# Patient Record
Sex: Male | Born: 1935 | Race: White | Hispanic: No | Marital: Married | Smoking: Former smoker
Health system: Southern US, Community
[De-identification: ages and names within clinical notes are randomized; demographics above are authoritative.]

## PROBLEM LIST (undated history)

## (undated) DIAGNOSIS — E039 Hypothyroidism, unspecified: Secondary | ICD-10-CM

## (undated) DIAGNOSIS — I2510 Atherosclerotic heart disease of native coronary artery without angina pectoris: Secondary | ICD-10-CM

## (undated) DIAGNOSIS — I2119 ST elevation (STEMI) myocardial infarction involving other coronary artery of inferior wall: Secondary | ICD-10-CM

## (undated) DIAGNOSIS — I340 Nonrheumatic mitral (valve) insufficiency: Secondary | ICD-10-CM

## (undated) DIAGNOSIS — E782 Mixed hyperlipidemia: Secondary | ICD-10-CM

## (undated) DIAGNOSIS — N400 Enlarged prostate without lower urinary tract symptoms: Secondary | ICD-10-CM

## (undated) DIAGNOSIS — E119 Type 2 diabetes mellitus without complications: Secondary | ICD-10-CM

## (undated) HISTORY — DX: Nonrheumatic mitral (valve) insufficiency: I34.0

## (undated) HISTORY — DX: Hypothyroidism, unspecified: E03.9

## (undated) HISTORY — DX: Benign prostatic hyperplasia without lower urinary tract symptoms: N40.0

## (undated) HISTORY — DX: Atherosclerotic heart disease of native coronary artery without angina pectoris: I25.10

## (undated) HISTORY — DX: Type 2 diabetes mellitus without complications: E11.9

## (undated) HISTORY — DX: Mixed hyperlipidemia: E78.2

## (undated) HISTORY — PX: STOMACH SURGERY: SHX791

---

## 2012-05-07 ENCOUNTER — Encounter: Payer: Self-pay | Admitting: *Deleted

## 2012-05-07 ENCOUNTER — Ambulatory Visit (INDEPENDENT_AMBULATORY_CARE_PROVIDER_SITE_OTHER): Payer: Medicare Other | Admitting: Cardiology

## 2012-05-07 ENCOUNTER — Encounter: Payer: Self-pay | Admitting: Cardiology

## 2012-05-07 ENCOUNTER — Other Ambulatory Visit: Payer: Self-pay | Admitting: Cardiology

## 2012-05-07 VITALS — BP 161/69 | HR 61 | Ht 70.0 in | Wt 185.0 lb

## 2012-05-07 DIAGNOSIS — R072 Precordial pain: Secondary | ICD-10-CM

## 2012-05-07 NOTE — Progress Notes (Signed)
HPI The patient has no prior cardiac history. He is an active gentleman who looks younger than his stated age. However, in October he awoke with some chest discomfort. This was mid sternal 6/10 discomfort. He had not had this to work. He awoke him from his sleep and he got up to take some times. He said it lasted for part of that evening. It eventually went away. He does not recall having any associated symptoms such as nausea vomiting or diaphoresis. He has not had any palpitations, presyncope or syncope. He has had no PND or orthopnea. He has had no weight gain or edema. He is very active working in his yard and tends to a large garden. With this level of activity he cannot bring on any symptoms. He has not had any prior cardiac workup that he recalls.  No Known Allergies  Current Outpatient Prescriptions  Medication Sig Dispense Refill  . doxazosin (CARDURA) 4 MG tablet Take 4 mg by mouth at bedtime.       . fish oil-omega-3 fatty acids 1000 MG capsule Take 1 g by mouth daily.      Marland Kitchen levothyroxine (SYNTHROID, LEVOTHROID) 50 MCG tablet Take 50 mcg by mouth daily.       . metFORMIN (GLUCOPHAGE) 1000 MG tablet Take 1,000 mg by mouth daily with breakfast.         Past Medical History  Diagnosis Date  . Diabetes   . Hypothyroid   . BPH (benign prostatic hyperplasia)     Past Surgical History  Procedure Date  . Stomach surgery     Treatment of an abscess    Family History  Problem Relation Age of Onset  . Heart failure Father 29  . Parkinsonism Mother 2    History   Social History  . Marital Status: Unknown    Spouse Name: N/A    Number of Children: 1  . Years of Education: N/A   Occupational History  . Not on file.   Social History Main Topics  . Smoking status: Former Smoker -- 1.0 packs/day for 40 years    Types: Cigarettes    Quit date: 06/26/1982  . Smokeless tobacco: Not on file  . Alcohol Use: No  . Drug Use: No  . Sexually Active: Not on file   Other  Topics Concern  . Not on file   Social History Narrative   Lives at home with wife.      ROS:  Positive for decreased hearing. Otherwise as stated in the HPI and negative for all other systems.  PHYSICAL EXAM BP 161/69  Pulse 61  Ht 5\' 10"  (1.778 m)  Wt 185 lb (83.915 kg)  BMI 26.54 kg/m2 GENERAL:  Well appearing HEENT:  Pupils equal round and reactive, fundi not visualized, oral mucosa unremarkable NECK:  No jugular venous distention, waveform within normal limits, carotid upstroke brisk and symmetric, no bruits, no thyromegaly LYMPHATICS:  No cervical, inguinal adenopathy LUNGS:  Clear to auscultation bilaterally BACK:  No CVA tenderness CHEST:  Unremarkable HEART:  PMI not displaced or sustained,S1 and S2 within normal limits, no S3, no S4, no clicks, no rubs, no murmurs ABD:  Flat, positive bowel sounds normal in frequency in pitch, no bruits, no rebound, no guarding, no midline pulsatile mass, no hepatomegaly, no splenomegaly EXT:  2 plus pulses throughout, no edema, no cyanosis no clubbing SKIN:  No rashes no nodules NEURO:  Cranial nerves II through XII grossly intact, motor grossly intact throughout PSYCH:  Cognitively intact, oriented to person place and time  EKG:   Sinus bradycardia, rate 50, left axis deviation, left anterior fascicular block, early transition in lead V2, no acute ST-T wave changes. 05/07/2012  ASSESSMENT AND PLAN  Chest pain - The chest pain is somewhat atypical. However, he does have cardiovascular risk factors. I think screening with an exercise treadmill test would be reasonable. If this is normal no further cardiovascular testing is suggested.  HTN - His blood pressure is well controlled. He will continue the medicines as listed.

## 2012-05-07 NOTE — Patient Instructions (Addendum)
Your physician recommends that you schedule a follow-up appointment as needed. We will call you with your results. Your physician recommends that you continue on your current medications as directed. Please refer to the Current Medication list given to you today.  Your physician has requested that you have an exercise tolerance test. For further information please visit [email or web address]. Please also follow instruction sheet, as given.

## 2012-05-09 DIAGNOSIS — R079 Chest pain, unspecified: Secondary | ICD-10-CM

## 2012-05-17 ENCOUNTER — Telehealth: Payer: Self-pay | Admitting: *Deleted

## 2012-05-17 NOTE — Telephone Encounter (Signed)
Message copied by Merlene Laughter on Fri May 17, 2012  1:08 PM ------      Message from: Minus Breeding      Created: Thu May 16, 2012  7:36 PM       He had a very significant BP response with his stress test.  Call the patient and let him know that I would like for him to follow with Dr. Hampton Abbot for BP management.

## 2012-05-17 NOTE — Telephone Encounter (Signed)
Patient informed and copy sent to PCP. 

## 2017-11-03 ENCOUNTER — Inpatient Hospital Stay (HOSPITAL_COMMUNITY)
Admission: EM | Admit: 2017-11-03 | Discharge: 2017-11-08 | DRG: 246 | Disposition: A | Discharge status: Home Health | Payer: Medicare Other | Attending: Cardiology | Admitting: Cardiology

## 2017-11-03 ENCOUNTER — Encounter (HOSPITAL_COMMUNITY): Payer: Self-pay | Admitting: Student

## 2017-11-03 ENCOUNTER — Ambulatory Visit (HOSPITAL_COMMUNITY)
Admission: EM | Admit: 2017-11-03 | Discharge: 2017-11-03 | Disposition: A | Discharge status: Home / Self Care | Payer: Medicare Other | Source: Ambulatory Visit | Attending: Cardiovascular Disease | Admitting: Cardiovascular Disease

## 2017-11-03 ENCOUNTER — Encounter (HOSPITAL_COMMUNITY)
Admission: EM | Disposition: A | Discharge status: Home Health | Payer: Self-pay | Source: Home / Self Care | Attending: Cardiovascular Disease

## 2017-11-03 ENCOUNTER — Other Ambulatory Visit: Payer: Self-pay

## 2017-11-03 DIAGNOSIS — I25110 Atherosclerotic heart disease of native coronary artery with unstable angina pectoris: Secondary | ICD-10-CM | POA: Diagnosis present

## 2017-11-03 DIAGNOSIS — N179 Acute kidney failure, unspecified: Secondary | ICD-10-CM | POA: Diagnosis present

## 2017-11-03 DIAGNOSIS — Z955 Presence of coronary angioplasty implant and graft: Secondary | ICD-10-CM

## 2017-11-03 DIAGNOSIS — Z8249 Family history of ischemic heart disease and other diseases of the circulatory system: Secondary | ICD-10-CM

## 2017-11-03 DIAGNOSIS — R001 Bradycardia, unspecified: Secondary | ICD-10-CM | POA: Diagnosis present

## 2017-11-03 DIAGNOSIS — I472 Ventricular tachycardia: Secondary | ICD-10-CM | POA: Diagnosis not present

## 2017-11-03 DIAGNOSIS — R011 Cardiac murmur, unspecified: Secondary | ICD-10-CM | POA: Diagnosis present

## 2017-11-03 DIAGNOSIS — E1122 Type 2 diabetes mellitus with diabetic chronic kidney disease: Secondary | ICD-10-CM | POA: Diagnosis present

## 2017-11-03 DIAGNOSIS — N183 Chronic kidney disease, stage 3 unspecified: Secondary | ICD-10-CM | POA: Diagnosis present

## 2017-11-03 DIAGNOSIS — I129 Hypertensive chronic kidney disease with stage 1 through stage 4 chronic kidney disease, or unspecified chronic kidney disease: Secondary | ICD-10-CM | POA: Diagnosis present

## 2017-11-03 DIAGNOSIS — I2111 ST elevation (STEMI) myocardial infarction involving right coronary artery: Secondary | ICD-10-CM | POA: Diagnosis not present

## 2017-11-03 DIAGNOSIS — R079 Chest pain, unspecified: Secondary | ICD-10-CM | POA: Diagnosis present

## 2017-11-03 DIAGNOSIS — I340 Nonrheumatic mitral (valve) insufficiency: Secondary | ICD-10-CM

## 2017-11-03 DIAGNOSIS — Z7984 Long term (current) use of oral hypoglycemic drugs: Secondary | ICD-10-CM | POA: Diagnosis not present

## 2017-11-03 DIAGNOSIS — E118 Type 2 diabetes mellitus with unspecified complications: Secondary | ICD-10-CM | POA: Diagnosis not present

## 2017-11-03 DIAGNOSIS — I361 Nonrheumatic tricuspid (valve) insufficiency: Secondary | ICD-10-CM | POA: Diagnosis not present

## 2017-11-03 DIAGNOSIS — I4901 Ventricular fibrillation: Secondary | ICD-10-CM | POA: Diagnosis not present

## 2017-11-03 DIAGNOSIS — I255 Ischemic cardiomyopathy: Secondary | ICD-10-CM | POA: Diagnosis present

## 2017-11-03 DIAGNOSIS — E785 Hyperlipidemia, unspecified: Secondary | ICD-10-CM | POA: Diagnosis present

## 2017-11-03 DIAGNOSIS — I2510 Atherosclerotic heart disease of native coronary artery without angina pectoris: Secondary | ICD-10-CM | POA: Diagnosis not present

## 2017-11-03 DIAGNOSIS — N400 Enlarged prostate without lower urinary tract symptoms: Secondary | ICD-10-CM | POA: Diagnosis present

## 2017-11-03 DIAGNOSIS — I080 Rheumatic disorders of both mitral and aortic valves: Secondary | ICD-10-CM | POA: Diagnosis present

## 2017-11-03 DIAGNOSIS — I2119 ST elevation (STEMI) myocardial infarction involving other coronary artery of inferior wall: Principal | ICD-10-CM | POA: Diagnosis present

## 2017-11-03 DIAGNOSIS — Z79899 Other long term (current) drug therapy: Secondary | ICD-10-CM

## 2017-11-03 DIAGNOSIS — Z87891 Personal history of nicotine dependence: Secondary | ICD-10-CM

## 2017-11-03 DIAGNOSIS — Z79890 Hormone replacement therapy (postmenopausal): Secondary | ICD-10-CM

## 2017-11-03 DIAGNOSIS — N170 Acute kidney failure with tubular necrosis: Secondary | ICD-10-CM | POA: Diagnosis not present

## 2017-11-03 DIAGNOSIS — I313 Pericardial effusion (noninflammatory): Secondary | ICD-10-CM | POA: Diagnosis present

## 2017-11-03 DIAGNOSIS — L8990 Pressure ulcer of unspecified site, unspecified stage: Secondary | ICD-10-CM | POA: Clinically undetermined

## 2017-11-03 DIAGNOSIS — E039 Hypothyroidism, unspecified: Secondary | ICD-10-CM | POA: Diagnosis present

## 2017-11-03 DIAGNOSIS — E782 Mixed hyperlipidemia: Secondary | ICD-10-CM | POA: Diagnosis not present

## 2017-11-03 DIAGNOSIS — I213 ST elevation (STEMI) myocardial infarction of unspecified site: Secondary | ICD-10-CM | POA: Diagnosis present

## 2017-11-03 HISTORY — DX: ST elevation (STEMI) myocardial infarction involving other coronary artery of inferior wall: I21.19

## 2017-11-03 HISTORY — PX: CORONARY/GRAFT ACUTE MI REVASCULARIZATION: CATH118305

## 2017-11-03 HISTORY — PX: LEFT HEART CATH AND CORONARY ANGIOGRAPHY: CATH118249

## 2017-11-03 HISTORY — PX: CORONARY STENT INTERVENTION: CATH118234

## 2017-11-03 LAB — TROPONIN I

## 2017-11-03 LAB — MRSA PCR SCREENING: MRSA BY PCR: NEGATIVE

## 2017-11-03 LAB — HEMOGLOBIN A1C
Hgb A1c MFr Bld: 6.2 % — ABNORMAL HIGH (ref 4.8–5.6)
MEAN PLASMA GLUCOSE: 131.24 mg/dL

## 2017-11-03 SURGERY — CORONARY/GRAFT ACUTE MI REVASCULARIZATION
Anesthesia: LOCAL

## 2017-11-03 MED ORDER — ACETAMINOPHEN 325 MG PO TABS: 650.0000 mg | ORAL_TABLET | ORAL | Status: DC | PRN

## 2017-11-03 MED ORDER — SODIUM CHLORIDE 0.9% FLUSH: 3.0000 mL | INTRAVENOUS | Status: DC | PRN

## 2017-11-03 MED ORDER — METOPROLOL TARTRATE 5 MG/5ML IV SOLN
INTRAVENOUS | Status: AC
  Filled 2017-11-03: qty 5

## 2017-11-03 MED ORDER — HEPARIN (PORCINE) IN NACL 1000-0.9 UT/500ML-% IV SOLN
INTRAVENOUS | Status: AC
  Filled 2017-11-03: qty 1000

## 2017-11-03 MED ORDER — METOPROLOL TARTRATE 5 MG/5ML IV SOLN
INTRAVENOUS | Status: DC | PRN
  Administered 2017-11-03: 2.5 mg via INTRAVENOUS

## 2017-11-03 MED ORDER — ASPIRIN EC 81 MG PO TBEC
81.0000 mg | DELAYED_RELEASE_TABLET | Freq: Every day | ORAL | Status: DC
  Administered 2017-11-04 – 2017-11-08 (×5): 81 mg via ORAL
  Filled 2017-11-03 (×5): qty 1

## 2017-11-03 MED ORDER — SODIUM CHLORIDE 0.9 % IV SOLN
INTRAVENOUS | Status: DC
  Administered 2017-11-03: 18:00:00 via INTRAVENOUS
  Administered 2017-11-03: 75 mL/h via INTRAVENOUS

## 2017-11-03 MED ORDER — TICAGRELOR 90 MG PO TABS
ORAL_TABLET | ORAL | Status: DC | PRN
  Administered 2017-11-03: 180 mg via ORAL

## 2017-11-03 MED ORDER — SODIUM CHLORIDE 0.9 % IV SOLN
INTRAVENOUS | Status: AC | PRN
  Administered 2017-11-03 (×2): 1.75 mg/kg/h via INTRAVENOUS

## 2017-11-03 MED ORDER — BIVALIRUDIN TRIFLUOROACETATE 250 MG IV SOLR
INTRAVENOUS | Status: AC
  Filled 2017-11-03: qty 250

## 2017-11-03 MED ORDER — AMIODARONE HCL IN DEXTROSE 360-4.14 MG/200ML-% IV SOLN
INTRAVENOUS | Status: AC
  Filled 2017-11-03: qty 200

## 2017-11-03 MED ORDER — HEPARIN SODIUM (PORCINE) 1000 UNIT/ML IJ SOLN
INTRAMUSCULAR | Status: DC | PRN
  Administered 2017-11-03: 2000 [IU] via INTRAVENOUS

## 2017-11-03 MED ORDER — IOHEXOL 350 MG/ML SOLN
INTRAVENOUS | Status: DC | PRN
  Administered 2017-11-03: 145 mL via INTRAVENOUS

## 2017-11-03 MED ORDER — SODIUM CHLORIDE 0.9% FLUSH
3.0000 mL | Freq: Two times a day (BID) | INTRAVENOUS | Status: DC
  Administered 2017-11-03 – 2017-11-07 (×9): 3 mL via INTRAVENOUS

## 2017-11-03 MED ORDER — MORPHINE SULFATE (PF) 2 MG/ML IV SOLN: 2.0000 mg | INTRAVENOUS | Status: DC | PRN

## 2017-11-03 MED ORDER — AMIODARONE HCL IN DEXTROSE 360-4.14 MG/200ML-% IV SOLN
INTRAVENOUS | Status: AC | PRN
  Administered 2017-11-03: 30 mg/h via INTRAVENOUS

## 2017-11-03 MED ORDER — VERAPAMIL HCL 2.5 MG/ML IV SOLN
INTRAVENOUS | Status: AC
  Filled 2017-11-03: qty 2

## 2017-11-03 MED ORDER — SODIUM CHLORIDE 0.9 % IV SOLN
INTRAVENOUS | Status: AC | PRN
  Administered 2017-11-03: 10 mL/h via INTRAVENOUS

## 2017-11-03 MED ORDER — VERAPAMIL HCL 2.5 MG/ML IV SOLN
INTRA_ARTERIAL | Status: DC | PRN
  Administered 2017-11-03: 8 mL via INTRA_ARTERIAL

## 2017-11-03 MED ORDER — AMIODARONE HCL 150 MG/3ML IV SOLN
INTRAVENOUS | Status: AC
  Filled 2017-11-03: qty 3

## 2017-11-03 MED ORDER — AMIODARONE LOAD VIA INFUSION
150.0000 mg | Freq: Once | INTRAVENOUS | Status: AC
  Administered 2017-11-03: 150 mg via INTRAVENOUS
  Filled 2017-11-03: qty 83.34

## 2017-11-03 MED ORDER — AMIODARONE HCL IN DEXTROSE 360-4.14 MG/200ML-% IV SOLN
30.0000 mg/h | INTRAVENOUS | Status: DC
  Administered 2017-11-03: 30 mg/h via INTRAVENOUS

## 2017-11-03 MED ORDER — AMIODARONE HCL IN DEXTROSE 360-4.14 MG/200ML-% IV SOLN
60.0000 mg/h | INTRAVENOUS | Status: AC
  Administered 2017-11-03 (×2): 60 mg/h via INTRAVENOUS
  Filled 2017-11-03: qty 200

## 2017-11-03 MED ORDER — ENOXAPARIN SODIUM 40 MG/0.4ML ~~LOC~~ SOLN: 40.0000 mg | SUBCUTANEOUS | Status: DC

## 2017-11-03 MED ORDER — HEPARIN SODIUM (PORCINE) 1000 UNIT/ML IJ SOLN
INTRAMUSCULAR | Status: AC
  Filled 2017-11-03: qty 1

## 2017-11-03 MED ORDER — LIDOCAINE HCL (PF) 1 % IJ SOLN
INTRAMUSCULAR | Status: AC
  Filled 2017-11-03: qty 30

## 2017-11-03 MED ORDER — SODIUM CHLORIDE 0.9 % IV SOLN
INTRAVENOUS | Status: AC | PRN
  Administered 2017-11-03: 100 mL/h via INTRAVENOUS

## 2017-11-03 MED ORDER — HEPARIN (PORCINE) IN NACL 2-0.9 UNITS/ML
INTRAMUSCULAR | Status: AC | PRN
  Administered 2017-11-03 (×2): 500 mL

## 2017-11-03 MED ORDER — LABETALOL HCL 5 MG/ML IV SOLN
10.0000 mg | INTRAVENOUS | Status: AC | PRN
  Filled 2017-11-03: qty 4

## 2017-11-03 MED ORDER — HYDRALAZINE HCL 20 MG/ML IJ SOLN
5.0000 mg | INTRAMUSCULAR | Status: AC | PRN
  Administered 2017-11-03 (×2): 5 mg via INTRAVENOUS
  Filled 2017-11-03: qty 1

## 2017-11-03 MED ORDER — SODIUM CHLORIDE 0.9 % IV SOLN: 250.0000 mL | INTRAVENOUS | Status: DC | PRN

## 2017-11-03 MED ORDER — LIDOCAINE HCL (PF) 1 % IJ SOLN
INTRAMUSCULAR | Status: DC | PRN
  Administered 2017-11-03: 2 mL

## 2017-11-03 MED ORDER — NITROGLYCERIN 0.4 MG SL SUBL: 0.4000 mg | SUBLINGUAL_TABLET | SUBLINGUAL | Status: DC | PRN

## 2017-11-03 MED ORDER — BIVALIRUDIN BOLUS VIA INFUSION - CUPID
INTRAVENOUS | Status: DC | PRN
  Administered 2017-11-03: 63 mg via INTRAVENOUS

## 2017-11-03 MED ORDER — TICAGRELOR 90 MG PO TABS
ORAL_TABLET | ORAL | Status: AC
  Filled 2017-11-03: qty 2

## 2017-11-03 MED ORDER — LEVOTHYROXINE SODIUM 50 MCG PO TABS
50.0000 ug | ORAL_TABLET | Freq: Every day | ORAL | Status: DC
  Administered 2017-11-04 – 2017-11-08 (×5): 50 ug via ORAL
  Filled 2017-11-03 (×5): qty 1

## 2017-11-03 MED ORDER — ASPIRIN 81 MG PO CHEW: 81.0000 mg | CHEWABLE_TABLET | Freq: Every day | ORAL | Status: DC

## 2017-11-03 MED ORDER — SODIUM CHLORIDE 0.9 % IV SOLN
1.7500 mg/kg/h | INTRAVENOUS | Status: AC
  Administered 2017-11-03: 1.75 mg/kg/h via INTRAVENOUS
  Filled 2017-11-03: qty 250

## 2017-11-03 MED ORDER — ONDANSETRON HCL 4 MG/2ML IJ SOLN
4.0000 mg | Freq: Four times a day (QID) | INTRAMUSCULAR | Status: DC | PRN
  Administered 2017-11-03: 4 mg via INTRAVENOUS
  Filled 2017-11-03: qty 2

## 2017-11-03 MED ORDER — ONDANSETRON HCL 4 MG/2ML IJ SOLN: 4.0000 mg | Freq: Four times a day (QID) | INTRAMUSCULAR | Status: DC | PRN

## 2017-11-03 MED ORDER — TICAGRELOR 90 MG PO TABS
90.0000 mg | ORAL_TABLET | Freq: Two times a day (BID) | ORAL | Status: DC
  Administered 2017-11-04 – 2017-11-08 (×9): 90 mg via ORAL
  Filled 2017-11-03 (×9): qty 1

## 2017-11-03 MED ORDER — BIVALIRUDIN TRIFLUOROACETATE 250 MG IV SOLR
1.7500 mg/kg/h | INTRAVENOUS | Status: DC
  Administered 2017-11-03: 1.75 mg/kg/h via INTRAVENOUS

## 2017-11-03 MED ORDER — AMIODARONE LOAD VIA INFUSION
INTRAVENOUS | Status: DC | PRN
  Administered 2017-11-03 (×2): 150 mg via INTRAVENOUS

## 2017-11-03 MED ORDER — NITROGLYCERIN 1 MG/10 ML FOR IR/CATH LAB
INTRA_ARTERIAL | Status: AC
  Filled 2017-11-03: qty 10

## 2017-11-03 SURGICAL SUPPLY — 26 items
BALLN SAPPHIRE 2.0X12 (BALLOONS) ×2
BALLN SAPPHIRE 2.5X12 (BALLOONS) ×2
BALLN SAPPHIRE ~~LOC~~ 2.75X15 (BALLOONS) ×2 IMPLANT
BALLN SAPPHIRE ~~LOC~~ 3.25X10 (BALLOONS) ×2 IMPLANT
BALLOON SAPPHIRE 2.0X12 (BALLOONS) ×1 IMPLANT
BALLOON SAPPHIRE 2.5X12 (BALLOONS) ×1 IMPLANT
CATH INFINITI 5FR MULTPACK ANG (CATHETERS) ×2 IMPLANT
CATH OPTITORQUE TIG 4.0 5F (CATHETERS) ×2 IMPLANT
CATH VISTA GUIDE 6FR JR4 (CATHETERS) ×2 IMPLANT
DEVICE RAD COMP TR BAND LRG (VASCULAR PRODUCTS) ×2 IMPLANT
GLIDESHEATH SLEND A-KIT 6F 22G (SHEATH) ×2 IMPLANT
GLIDESHEATH SLEND SS 6F .021 (SHEATH) ×2 IMPLANT
GUIDEWIRE INQWIRE 1.5J.035X260 (WIRE) ×1 IMPLANT
INQWIRE 1.5J .035X260CM (WIRE) ×2
KIT ENCORE 26 ADVANTAGE (KITS) ×4 IMPLANT
KIT HEART LEFT (KITS) ×2 IMPLANT
PACK CARDIAC CATHETERIZATION (CUSTOM PROCEDURE TRAY) ×2 IMPLANT
SHEATH PINNACLE 6F 10CM (SHEATH) ×2 IMPLANT
STENT SYNERGY DES 2.5X12 (Permanent Stent) ×2 IMPLANT
STENT SYNERGY DES 3X16 (Permanent Stent) ×2 IMPLANT
STENT SYNERGY DES 3X32 (Permanent Stent) ×2 IMPLANT
TRANSDUCER W/STOPCOCK (MISCELLANEOUS) ×2 IMPLANT
TUBING CIL FLEX 10 FLL-RA (TUBING) ×2 IMPLANT
WIRE ASAHI PROWATER 180CM (WIRE) ×2 IMPLANT
WIRE EMERALD 3MM-J .035X150CM (WIRE) ×2 IMPLANT
WIRE HI TORQ VERSACORE-J 145CM (WIRE) ×2 IMPLANT

## 2017-11-03 NOTE — Progress Notes (Signed)
CRITICAL VALUE ALERT  Critical Value:  Troponin > 65  Date & Time Notied:  11-03-17 2225  Provider Notified: Dr Harrell Gave  Orders Received/Actions taken: No orders at this time

## 2017-11-03 NOTE — H&P (Signed)
History & Physical    Patient ID: Deonta Bomberger MRN: [id number], DOB/AGE: [birth year]   Admit date: 11/03/2017  Primary Care Provider: Ranae Palms, MD Primary Cardiologist: Previously Followed by Dr. Percival Spanish in 2013  Chief Complaint: Chest Pain/ STEMI  Patient Profile    Randy Fitzpatrick is a 84 y.o. male with past medical history of Hyperlipidemia, Hypothyroidism, and BPH who presents to [facility] on 11/03/2017 as a transfer from [facility] as a CODE STEMI.  History of Present Illness    Randy Fitzpatrick was last evaluated by cardiology in 04/2012 and reported being very active at that time and tending to a garden. He reported a previous episode of chest discomfort and it was recommended he have a treadmill stress test for further evaluation but I cannot see where this was performed.  In talking with the patient today, he reports developing acute chest discomfort around 1345 this afternoon while sitting on his couch. Reports associated dyspnea but denies any nausea, vomiting, or diaphoresis. He presented to [facility] and initial EKG showed ST elevation along the inferior leads, therefore CODE STEMI was activated.   He reports having 2 out of 10 chest discomfort at the time of this encounter. Reports that he is still active and has not experienced any chest discomfort or dyspnea on exertion with activity over the past several weeks. No known history of CAD or stent placement. Does have known hyperlipidemia and hypothyroidism.   Past Medical History:  Diagnosis Date  . BPH (benign prostatic hyperplasia)   . Diabetes ([place])   . Hypothyroid        Medications Prior to Admission: Prior to Admission medications   Medication Sig Start Date End Date Taking? Authorizing Provider  doxazosin (CARDURA) 4 MG tablet Take 4 mg by mouth at bedtime.  05/02/12   [provider]  fish oil-omega-3 fatty acids 1000 MG capsule Take 1 g by mouth daily.    [provider]    levothyroxine (SYNTHROID, LEVOTHROID) 50 MCG tablet Take 50 mcg by mouth daily.  02/29/12   [provider]  metFORMIN (GLUCOPHAGE) 1000 MG tablet Take 1,000 mg by mouth daily with breakfast.  05/02/12   [provider]     Allergies:   No Known Allergies  Social History:   Social History   Socioeconomic History  . Marital status: Unknown    Spouse name: Not on file  . Number of children: 1  . Years of education: Not on file  . Highest education level: Not on file  Occupational History  . Not on file  Social Needs  . Financial resource strain: Not on file  . Food insecurity:    Worry: Not on file    Inability: Not on file  . Transportation needs:    Medical: Not on file    Non-medical: Not on file  Tobacco Use  . Smoking status: Former Smoker    Packs/day: 1.00    Years: 40.00    Pack years: 40.00    Types: Cigarettes    Last attempt to quit: 06/26/1982    Years since quitting: 35.3  Substance and Sexual Activity  . Alcohol use: No  . Drug use: No  . Sexual activity: Not on file  Lifestyle  . Physical activity:    Days per week: Not on file    Minutes per session: Not on file  . Stress: Not on file  Relationships  . Social connections:    Talks on  phone: Not on file    Gets together: Not on file    Attends religious service: Not on file    Active member of club or organization: Not on file    Attends meetings of clubs or organizations: Not on file    Relationship status: Not on file  . Intimate partner violence:    Fear of current or ex partner: Not on file    Emotionally abused: Not on file    Physically abused: Not on file    Forced sexual activity: Not on file  Other Topics Concern  . Not on file  Social History Narrative   Lives at home with wife.        Family History:  The patient's family history includes Heart failure (age of onset: 23) in his father; Parkinsonism (age of onset: 63) in his mother.     Review of Systems     General:  No chills, fever, night sweats or weight changes.  Cardiovascular:  No edema, orthopnea, palpitations, paroxysmal nocturnal dyspnea. Positive for chest pain and dyspnea.  Dermatological: No rash, lesions/masses Respiratory: No cough, dyspnea Urologic: No hematuria, dysuria Abdominal:   No nausea, vomiting, diarrhea, bright red blood per rectum, melena, or hematemesis Neurologic:  No visual changes, wkns, changes in mental status. All other systems reviewed and are otherwise negative except as noted above.  Physical Exam    There were no vitals filed for this visit. No intake or output data in the 24 hours ending 11/03/17 1528 There were no vitals filed for this visit. There is no height or weight on file to calculate BMI.   General: Well developed, elderly Caucasian male in no acute distress. Appears younger than stated age. Head: Normocephalic, atraumatic, sclera non-icteric, no xanthomas, nares are without discharge. Neck: No carotid bruits. JVD not elevated.  Lungs: Respirations regular and unlabored, without wheezes or rales.  Heart: Regular rate and rhythm. No S3 or S4.  No murmur, no rubs, or gallops appreciated. Abdomen: Soft, non-tender, non-distended with normoactive bowel sounds. No hepatomegaly. No rebound/guarding. No obvious abdominal masses. Msk:  Strength and tone appear normal for age. No joint deformities or effusions. Extremities: No clubbing or cyanosis. No edema.  Distal pedal pulses are 2+ bilaterally. Neuro: Alert and oriented X 3. Moves all extremities spontaneously. No focal deficits noted. Psych:  Responds to questions appropriately with a normal affect. Skin: No rashes or lesions noted  Labs and Radiology Studies    EKG:  The ECG that was done was personally reviewed and demonstrates sinus bradycardia, HR 59, with ST elevation along inferior leads.   Relevant CV Studies:  None on File.   Laboratory Data:  ChemistryNo results for input(s):  NA, K, CL, CO2, GLUCOSE, BUN, CREATININE, CALCIUM, GFRNONAA, GFRAA, ANIONGAP in the last 168 hours.  No results for input(s): PROT, ALBUMIN, AST, ALT, ALKPHOS, BILITOT in the last 168 hours. HematologyNo results for input(s): WBC, RBC, HGB, HCT, MCV, MCH, MCHC, RDW, PLT in the last 168 hours. Cardiac EnzymesNo results for input(s): TROPONINI in the last 168 hours. No results for input(s): TROPIPOC in the last 168 hours.  BNPNo results for input(s): BNP, PROBNP in the last 168 hours.  DDimer No results for input(s): DDIMER in the last 168 hours.  Radiology/Studies:  No results found.  Assessment and Plan:   1. Acute Inferior STEMI - patient presents with new-onset chest discomfort which started earlier this afternoon. Pain has improved with administration of SL NTG and is now a  2/10. Initial EKG shows ST elevation along the inferior leads, therefore CODE STEMI was activated and he now presents to the cath lab for an emergent cardiac catheterization. - will check Hgb A1c and FLP. Cycle cardiac enzymes. Initiate high-intensity statin. Would hold BB therapy for now as HR is in the 50's. Further recommendations pending cath results.   2. HLD - no recent FLP on file and not listed as taking a statin.  - will check FLP and plan to start a high-intensity statin following his cath.  3. Hypothyroidism - continue PTA Synthroid.    Severity of Illness: The appropriate patient status for this patient is INPATIENT. Inpatient status is judged to be reasonable and necessary in order to provide the required intensity of service to ensure the patient's safety. The patient's presenting symptoms, physical exam findings, and initial radiographic and laboratory data in the context of their chronic comorbidities is felt to place them at high risk for further clinical deterioration. Furthermore, it is not anticipated that the patient will be medically stable for discharge from the hospital within 2 midnights of  admission. The following factors support the patient status of inpatient.   " The patient's presenting symptoms include chest pain and dyspnea. " The initial radiographic and laboratory data are worrisome because of ST elevation along inferior leads " The chronic co-morbidities include HLD and Hypothyroidism.   * I certify that at the point of admission it is my clinical judgment that the patient will require inpatient hospital care spanning beyond 2 midnights from the point of admission due to high intensity of service, high risk for further deterioration and high frequency of surveillance required.*    For questions or updates, please contact [place] Please consult [email or web address] for contact info under Cardiology/STEMI.   Signed, Erma Heritage, PA-C 11/03/2017, 3:28 PM Pager: [phone]

## 2017-11-03 NOTE — Plan of Care (Signed)
  Problem: Education: Goal: Knowledge of General Education information will improve Outcome: Progressing   Problem: Health Behavior/Discharge Planning: Goal: Ability to manage health-related needs will improve Outcome: Progressing   Problem: Clinical Measurements: Goal: Ability to maintain clinical measurements within normal limits will improve Outcome: Progressing Goal: Will remain free from infection Outcome: Progressing Goal: Diagnostic test results will improve Outcome: Progressing Goal: Respiratory complications will improve Outcome: Progressing Goal: Cardiovascular complication will be avoided Outcome: Progressing   Problem: Activity: Goal: Risk for activity intolerance will decrease Outcome: Progressing   Problem: Nutrition: Goal: Adequate nutrition will be maintained Outcome: Progressing   Problem: Coping: Goal: Level of anxiety will decrease Outcome: Progressing   Problem: Elimination: Goal: Will not experience complications related to bowel motility Outcome: Progressing Goal: Will not experience complications related to urinary retention Outcome: Progressing   Problem: Pain Managment: Goal: General experience of comfort will improve Outcome: Progressing   Problem: Safety: Goal: Ability to remain free from injury will improve Outcome: Progressing   Problem: Skin Integrity: Goal: Risk for impaired skin integrity will decrease Outcome: Progressing   Problem: Education: Goal: Understanding of cardiac disease, CV risk reduction, and recovery process will improve Outcome: Progressing Goal: Understanding of medication regimen will improve Outcome: Progressing   Problem: Activity: Goal: Ability to tolerate increased activity will improve Outcome: Progressing   Problem: Cardiac: Goal: Ability to achieve and maintain adequate cardiopulmonary perfusion will improve Outcome: Progressing Goal: Vascular access site(s) Level 0-1 will be maintained Outcome:  Progressing   Problem: Health Behavior/Discharge Planning: Goal: Ability to safely manage health-related needs after discharge will improve Outcome: Progressing

## 2017-11-04 ENCOUNTER — Inpatient Hospital Stay (HOSPITAL_COMMUNITY): Payer: Medicare Other

## 2017-11-04 DIAGNOSIS — I361 Nonrheumatic tricuspid (valve) insufficiency: Secondary | ICD-10-CM

## 2017-11-04 DIAGNOSIS — L8990 Pressure ulcer of unspecified site, unspecified stage: Secondary | ICD-10-CM | POA: Clinically undetermined

## 2017-11-04 DIAGNOSIS — I2119 ST elevation (STEMI) myocardial infarction involving other coronary artery of inferior wall: Principal | ICD-10-CM

## 2017-11-04 DIAGNOSIS — I340 Nonrheumatic mitral (valve) insufficiency: Secondary | ICD-10-CM

## 2017-11-04 DIAGNOSIS — E782 Mixed hyperlipidemia: Secondary | ICD-10-CM

## 2017-11-04 LAB — BASIC METABOLIC PANEL
Anion gap: 11 (ref 5–15)
BUN: 15 mg/dL (ref 6–20)
CO2: 22 mmol/L (ref 22–32)
CREATININE: 1.5 mg/dL — AB (ref 0.61–1.24)
Calcium: 9.1 mg/dL (ref 8.9–10.3)
Chloride: 105 mmol/L (ref 101–111)
GFR calc Af Amer: 45 mL/min — ABNORMAL LOW (ref >60)
GFR calc non Af Amer: 39 mL/min — ABNORMAL LOW (ref >60)
GLUCOSE: 166 mg/dL — AB (ref 65–99)
Potassium: 4.2 mmol/L (ref 3.5–5.1)
Sodium: 138 mmol/L (ref 135–145)

## 2017-11-04 LAB — TROPONIN I
Troponin I: >65 ng/mL (ref <0.03)
Troponin I: >65 ng/mL (ref <0.03)

## 2017-11-04 LAB — LIPID PANEL
CHOL/HDL RATIO: 7 ratio
Cholesterol: 224 mg/dL — ABNORMAL HIGH (ref 0–200)
HDL: 32 mg/dL — AB (ref >40)
LDL CALC: 139 mg/dL — AB (ref 0–99)
Triglycerides: 263 mg/dL — ABNORMAL HIGH (ref <150)
VLDL: 53 mg/dL — AB (ref 0–40)

## 2017-11-04 LAB — ECHOCARDIOGRAM COMPLETE
Height: 70 in
Weight: 2698.43 oz

## 2017-11-04 LAB — CBC
HCT: 33.8 % — ABNORMAL LOW (ref 39.0–52.0)
Hemoglobin: 11.3 g/dL — ABNORMAL LOW (ref 13.0–17.0)
MCH: 34 pg (ref 26.0–34.0)
MCHC: 33.4 g/dL (ref 30.0–36.0)
MCV: 101.8 fL — ABNORMAL HIGH (ref 78.0–100.0)
PLATELETS: 210 10*3/uL (ref 150–400)
RBC: 3.32 MIL/uL — ABNORMAL LOW (ref 4.22–5.81)
RDW: 13.7 % (ref 11.5–15.5)
WBC: 8.8 10*3/uL (ref 4.0–10.5)

## 2017-11-04 LAB — GLUCOSE, CAPILLARY: Glucose-Capillary: 191 mg/dL — ABNORMAL HIGH (ref 65–99)

## 2017-11-04 MED ORDER — ENOXAPARIN SODIUM 30 MG/0.3ML ~~LOC~~ SOLN
30.0000 mg | SUBCUTANEOUS | Status: DC
  Administered 2017-11-04 – 2017-11-05 (×2): 30 mg via SUBCUTANEOUS
  Filled 2017-11-04 (×2): qty 0.3

## 2017-11-04 MED ORDER — ATORVASTATIN CALCIUM 80 MG PO TABS
80.0000 mg | ORAL_TABLET | Freq: Every day | ORAL | Status: DC
  Administered 2017-11-04 – 2017-11-07 (×4): 80 mg via ORAL
  Filled 2017-11-04 (×4): qty 1

## 2017-11-04 NOTE — Progress Notes (Signed)
  Echocardiogram 2D Echocardiogram has been performed.  Randy Fitzpatrick 11/04/2017, 9:41 AM

## 2017-11-04 NOTE — Progress Notes (Signed)
Progress Note  Patient Name: Randy Fitzpatrick Date of Encounter: 11/04/2017  Primary Cardiologist:   Percival Spanish / Gwenlyn Found   Subjective   84 year old gentleman with history of hyperlipidemia, hypothyroidism transferred from [facility] rocking him hospital as a code STEMI.  Patient was admitted yesterday.  He developed chest tightness around 1:45 in the afternoon while sitting on the couch.  Was associated with some shortness of breath.  He presented to [facility] rocking him and was found to have ST segment elevation in the inferior leads.  Was transferred to [facility] for further evaluation and management.  Cardiac cath yesterday revealed a mid RCA stenosis of 80% followed by distal RCA 100% stenosis.  He had successful stent placement to the RCA.  There was also a left main stenosis of 70%.  Second OM 100% stenosis.  Troponin levels are markedly elevated above 65.  His cholesterol this morning is 224.  LDL is 139.  Triglyceride level is 263.  His HDL is 32.  Hemoglobin A1c is 6.2.  Inpatient Medications    Scheduled Meds: . aspirin EC  81 mg Oral Daily  . enoxaparin (LOVENOX) injection  30 mg Subcutaneous Q24H  . levothyroxine  50 mcg Oral QAC breakfast  . sodium chloride flush  3 mL Intravenous Q12H  . ticagrelor  90 mg Oral BID   Continuous Infusions: . sodium chloride 10 mL/hr at 11/04/17 0700  . amiodarone 30 mg/hr (11/04/17 0700)   PRN Meds: sodium chloride, acetaminophen, morphine injection, nitroGLYCERIN, ondansetron (ZOFRAN) IV, sodium chloride flush   Vital Signs    Vitals:   11/04/17 0400 11/04/17 0500 11/04/17 0600 11/04/17 0700  BP: (!) 154/59 (!) 156/60 (!) 185/58 (!) 143/63  Pulse:      Resp: 18 18 18 13   Temp:      TempSrc:      SpO2: 97% 99% 100% 98%  Weight:      Height:        Intake/Output Summary (Last 24 hours) at 11/04/2017 0832 Last data filed at 11/04/2017 0700 Gross per 24 hour  Intake 1922.74 ml  Output -  Net 1922.74 ml   Filed Weights   11/03/17 1730  Weight: 168 lb 10.4 oz (76.5 kg)    Telemetry    Sinus brady  - Personally Reviewed  ECG    Sinus bradycardia 54 beats a minute.  He has an evolving inferior wall myocardial infarction.- Personally Reviewed  Physical Exam   GEN: No acute distress.   Neck: No JVD Cardiac: RRR, no murmurs, rubs, or gallops.  Respiratory: Clear to auscultation bilaterally. GI: Soft, nontender, non-distended  MS: No edema; No deformity. Neuro:  Nonfocal  Psych: Normal affect   Labs    Chemistry Recent Labs  Lab 11/04/17 0242  NA 138  K 4.2  CL 105  CO2 22  GLUCOSE 166*  BUN 15  CREATININE 1.50*  CALCIUM 9.1  GFRNONAA 39*  GFRAA 45*  ANIONGAP 11     Hematology Recent Labs  Lab 11/04/17 0242  WBC 8.8  RBC 3.32*  HGB 11.3*  HCT 33.8*  MCV 101.8*  MCH 34.0  MCHC 33.4  RDW 13.7  PLT 210    Cardiac Enzymes Recent Labs  Lab 11/03/17 2013 11/04/17 0242  TROPONINI >65.00* >65.00*   No results for input(s): TROPIPOC in the last 168 hours.   BNPNo results for input(s): BNP, PROBNP in the last 168 hours.   DDimer No results for input(s): DDIMER in the last 168 hours.  Radiology    No results found.  Cardiac Studies     Patient Profile     84 y.o. male with Inf. STEMI. S/P   Stenting of his RCA  On ASA , Brilinta Starting atorva 80   Assessment & Plan    1.  Coronary artery disease/ST segment elevation myocardial infarction Patient was admitted with an inferior wall ST segment elevation myocardial infarction.  He status post stenting of his right coronary artery.  He was found to have a 60 to 70% left main stenosis which we will treat medically. Continue aspirin and Brilinta.  We have started atorvastatin 80 mg a day. He will get an echocardiogram today.  2.  Hypertension: He has some hypertension last night.  This morning his blood pressure is well controlled.  His heart rate is slow so I do not think that he will tolerate a beta-blocker at  this time.  We will continue to follow this.  3.  Hyperlipidemia: His total cholesterol is 224.  His LDL is 139.  Triglyceride level is 236.  HDL is 32.  We will start atorvastatin 80 mg a day.    For questions or updates, please contact [place] Please consult [email or web address] for contact info under Cardiology/STEMI.      Signed, Mertie Moores, MD  11/04/2017, 8:32 AM

## 2017-11-05 ENCOUNTER — Encounter (HOSPITAL_COMMUNITY): Payer: Self-pay | Admitting: Cardiovascular Disease

## 2017-11-05 DIAGNOSIS — I340 Nonrheumatic mitral (valve) insufficiency: Secondary | ICD-10-CM

## 2017-11-05 DIAGNOSIS — E785 Hyperlipidemia, unspecified: Secondary | ICD-10-CM | POA: Clinically undetermined

## 2017-11-05 DIAGNOSIS — I25110 Atherosclerotic heart disease of native coronary artery with unstable angina pectoris: Secondary | ICD-10-CM

## 2017-11-05 DIAGNOSIS — I2510 Atherosclerotic heart disease of native coronary artery without angina pectoris: Secondary | ICD-10-CM | POA: Clinically undetermined

## 2017-11-05 DIAGNOSIS — E118 Type 2 diabetes mellitus with unspecified complications: Secondary | ICD-10-CM | POA: Diagnosis present

## 2017-11-05 LAB — POCT ACTIVATED CLOTTING TIME: Activated Clotting Time: 477 seconds

## 2017-11-05 LAB — POCT I-STAT, CHEM 8
BUN: 21 mg/dL — AB (ref 6–20)
CREATININE: 1.4 mg/dL — AB (ref 0.61–1.24)
Calcium, Ion: 1.2 mmol/L (ref 1.15–1.40)
Chloride: 105 mmol/L (ref 101–111)
Glucose, Bld: 142 mg/dL — ABNORMAL HIGH (ref 65–99)
HEMATOCRIT: 33 % — AB (ref 39.0–52.0)
HEMOGLOBIN: 11.2 g/dL — AB (ref 13.0–17.0)
POTASSIUM: 4.2 mmol/L (ref 3.5–5.1)
Sodium: 140 mmol/L (ref 135–145)
TCO2: 24 mmol/L (ref 22–32)

## 2017-11-05 LAB — GLUCOSE, CAPILLARY: Glucose-Capillary: 105 mg/dL — ABNORMAL HIGH (ref 65–99)

## 2017-11-05 MED ORDER — ENOXAPARIN SODIUM 40 MG/0.4ML ~~LOC~~ SOLN
40.0000 mg | SUBCUTANEOUS | Status: DC
  Administered 2017-11-06: 40 mg via SUBCUTANEOUS
  Filled 2017-11-05: qty 0.4

## 2017-11-05 MED ORDER — CARVEDILOL 3.125 MG PO TABS
3.1250 mg | ORAL_TABLET | Freq: Two times a day (BID) | ORAL | Status: DC
  Administered 2017-11-05 – 2017-11-08 (×7): 3.125 mg via ORAL
  Filled 2017-11-05 (×7): qty 1

## 2017-11-05 NOTE — Progress Notes (Deleted)
Pt to be d/c back to [facility] per MD order, report called to receiving RN all questions answered, pt VSS, pt and family verbalized understanding

## 2017-11-05 NOTE — Progress Notes (Signed)
CARDIAC REHAB PHASE I   PRE:  Rate/Rhythm: 66 SR    BP: sitting 141/57    SaO2: 98 RA  MODE:  Ambulation: 340 ft   POST:  Rate/Rhythm: 82 SR    BP: sitting 156/65     SaO2: 98 RA   Pt is deaf and his hearing aide needs to be cleaned. Daughter went to get that done but communication today is limited to written information. Pt able to walk with RW (too weak to go without it). Steady with RW and gait belt but did have slight LOB x1. Sts he otherwise feels good. VSS. Return to recliner. Gave pt MI book and dog earred pages for him to read. Will f/u tomorrow for education. Pt prefers using a cane so we will try that tomorrow. Daughter lives in [place] but [place] they are getting [facility] to come in and assist him at home (she sts 2 hours a day to prepare food, etc).  [id number]  [place], ACSM 11/05/2017 12:06 PM

## 2017-11-05 NOTE — Progress Notes (Addendum)
Progress Note  Patient Name: Eman Morimoto Date of Encounter: 11/05/2017  Primary Cardiologist:   Percival Spanish / Gwenlyn Found   Subjective   Doing well this morning with no recurrent chest pain or dyspnea symptoms. Notes chronic mild lower extremity edema that is stable for him. Was able to walk in the hall yesterday without any chest pain or pressure.  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 aspirin EC  81 mg Oral Daily  . atorvastatin  80 mg Oral q1800  . carvedilol  3.125 mg Oral BID WC  . enoxaparin (LOVENOX) injection  30 mg Subcutaneous Q24H  . levothyroxine  50 mcg Oral QAC breakfast  . sodium chloride flush  3 mL Intravenous Q12H  . ticagrelor  90 mg Oral BID   Continuous Infusions: . sodium chloride 10 mL/hr at 11/04/17 0700   PRN Meds: sodium chloride, acetaminophen, morphine injection, nitroGLYCERIN, ondansetron (ZOFRAN) IV, sodium chloride flush   Vital Signs    Vitals:   11/04/17 1934 11/04/17 2303 11/05/17 0246 11/05/17 0722  BP: 134/60 (!) 165/68 (!) 151/50 (!) 176/70  Pulse: 67 80 92 70  Resp: 19 (!) _0 Temp: 98.5 F (36.9 C) 98.8 F (37.1 C) 97.9 F (36.6 C) 98.5 F (36.9 C)  TempSrc: Oral Oral Oral Oral  SpO2: 95% 98% 99% 99%  Weight:   173 lb 1 oz (78.5 kg)   Height:        Intake/Output Summary (Last 24 hours) at 11/05/2017 0908 Last data filed at 11/05/2017 0246 Gross per 24 hour  Intake 630.34 ml  Output 1875 ml  Net -1244.66 ml   Filed Weights   11/03/17 1730 11/05/17 0246  Weight: 168 lb 10.4 oz (76.5 kg) 173 lb 1 oz (78.5 kg)    Telemetry    Sinus Rhythm- Personally Reviewed  ECG    Sinus bradycardia 54 beats a minute.  He has an evolving inferior wall myocardial infarction.- Personally Reviewed  Physical Exam   Physical Exam  Constitutional: He is oriented to person, place, and time. He appears  well-developed and well-nourished. No distress.   very healthy-appearing gentleman; appears he is younger than his stated age.  HENT:  Head: Normocephalic and atraumatic.  Mouth/Throat: No oropharyngeal exudate.    Hearing in place.  Hard of hearing  Eyes: EOM are normal.  Cardiovascular: Normal rate, regular rhythm, S1 normal and S2 normal.  No extrasystoles are present. PMI is not displaced. Exam reveals distant heart sounds and decreased pulses (Still palpable pedal pulses but decreased). Exam reveals no gallop and no friction rub.  Murmur heard. High-pitched blowing holosystolic murmur is present with a grade of 1/6 at the apex. Pulmonary/Chest: Effort normal and breath sounds normal. No respiratory distress. He has no wheezes. He has no rales. He exhibits no tenderness.  Abdominal: Soft. Bowel sounds are normal. He exhibits no distension. There is no tenderness. There is no rebound.  Musculoskeletal: Normal range of motion. He exhibits edema (-Trace to 1+ bilateral lower extremity edema).  Neurological: He is alert and oriented to person, place, and time. No cranial nerve deficit.  Hard of hearing  Skin: Skin is warm and dry. No rash noted. No erythema.   mild venous stasis Changes noted left greater than right lower extremity  Vitals reviewed.   Labs    Chemistry Recent  Labs  Lab 11/04/17 0242  NA 138  K 4.2  CL 105  CO2 22  GLUCOSE 166*  BUN 15  CREATININE 1.50*  CALCIUM 9.1  GFRNONAA 39*  GFRAA 45*  ANIONGAP 11     Hematology Recent Labs  Lab 11/04/17 0242  WBC 8.8  RBC 3.32*  HGB 11.3*  HCT 33.8*  MCV 101.8*  MCH 34.0  MCHC 33.4  RDW 13.7  PLT 210    Cardiac Enzymes Recent Labs  Lab 11/03/17 2013 11/04/17 0242 11/04/17 0823  TROPONINI >65.00* >65.00* >65.00*   No results for input(s): TROPIPOC in the last 168 hours.   BNPNo results for input(s): BNP, PROBNP in the last 168 hours.   DDimer No results for input(s): DDIMER in the last 168 hours.     Radiology    No results found.  Cardiac Studies    Cath-PCI 11/03/2017: mRCA 80% & dRCA 100% -- DES PCI; ostial & distal LM 70%, dOM2 100% => plan is Med Rx for now  PCI of p-dRCA: overlapping DES: pRCA3.0 x 32, mRCA 3.0 x 16 (3.25 mm), rRCA 2.5 x 12  Echo: EF 45-50%. Inferior HK. Mild LV dilation. GR 2 DD. Mod MR with severe MAC.  Patient Profile     84 year old gentleman with history of hyperlipidemia, hypothyroidism transferred from [facility] as a code STEMI.Patient was admitted 11/03/17 .  He developed chest tightness around 1:45 in the afternoon while sitting on the couch.  Was associated with some shortness of breath.  He presented to [facility] -- > was found to have Inferior ST segment elevation --> transferred to [facility] for further Emergent Cath/PCI.  Cardiac cath yesterday revealed a mid RCA stenosis of 80% followed by distal RCA 100% stenosis.  He had successful stent placement to the RCA.  There was also a left main stenosis of 70%.  Second OM 100% stenosis.  Initial Troponin levels were markedly elevated above 65.  Total cholesterol this is 224.  LDL is 139.  Triglyceride level is 263.  His HDL is 32.  Hemoglobin A1c is 6.2.   Assessment & Plan  Principal Problem:   Acute ST elevation myocardial infarction (STEMI) of inferior wall (HCC) Active Problems:   Coronary artery disease involving native coronary artery of native heart with unstable angina pectoris (HCC)   Left main coronary artery disease   Hyperlipidemia with target LDL less than 70   Type 2 diabetes mellitus with complication, without long-term current use of insulin (HCC)   Moderate mitral regurgitation by prior echocardiogram   Pressure injury of skin   1.  Inferior STEMI/CAD with UA/LM CAD -- admitted with an inferior wall ST segment elevation myocardial infarction --> PCI of culprit lesion in RCA. right coronary artery.  Also noted 60 to 70% left main stenosis which we  will treat medically. Mild Ischemic CM with Mod MR  Continue ASA/Brilinta (Would consider changing to Plavix given his age)  High dose Statin  Beta Blocker initially held 2/2 bradycardia -we will try low-dose carvedilol today  2.  Hypertension (with MR):  Remains Hypertensive   Start low dose Carvedilol.  Consider low dose ARB in AM.  3.  Hyperlipidemia: His total cholesterol is 224.  His LDL is 139.  Triglyceride level is 236.  HDL is 32.    On High dose Atorvastatin (anticipate titrating down)  4.  Has diagnosis of diabetes, but A1c is 6.2.  Holding off management for now. On Synthroid for  baseline hypothyroidism On subcu Lovenox for DVT prophylaxis, will add compression stockings    Transfer to telemetry today.  Anticipate discharge soon.  Will casement a consultation to help set up home health on discharge.  Cardiac rehab to see.   For questions or updates, please contact [place] Please consult [email or web address] for contact info under Cardiology/STEMI.      Signed, Glenetta Hew, MD  11/05/2017, 9:08 AM

## 2017-11-06 ENCOUNTER — Other Ambulatory Visit: Payer: Self-pay

## 2017-11-06 ENCOUNTER — Encounter (HOSPITAL_COMMUNITY): Payer: Self-pay

## 2017-11-06 LAB — CBC
HCT: 32.6 % — ABNORMAL LOW (ref 39.0–52.0)
Hemoglobin: 10.8 g/dL — ABNORMAL LOW (ref 13.0–17.0)
MCH: 33.6 pg (ref 26.0–34.0)
MCHC: 33.1 g/dL (ref 30.0–36.0)
MCV: 101.6 fL — AB (ref 78.0–100.0)
Platelets: 214 10*3/uL (ref 150–400)
RBC: 3.21 MIL/uL — AB (ref 4.22–5.81)
RDW: 13 % (ref 11.5–15.5)
WBC: 7.7 10*3/uL (ref 4.0–10.5)

## 2017-11-06 LAB — COMPREHENSIVE METABOLIC PANEL
ALK PHOS: 46 U/L (ref 38–126)
ALT: 28 U/L (ref 17–63)
AST: 43 U/L — AB (ref 15–41)
Albumin: 3.1 g/dL — ABNORMAL LOW (ref 3.5–5.0)
Anion gap: 10 (ref 5–15)
BUN: 34 mg/dL — AB (ref 6–20)
CALCIUM: 8.7 mg/dL — AB (ref 8.9–10.3)
CO2: 20 mmol/L — AB (ref 22–32)
CREATININE: 1.96 mg/dL — AB (ref 0.61–1.24)
Chloride: 106 mmol/L (ref 101–111)
GFR, EST AFRICAN AMERICAN: 33 mL/min — AB (ref >60)
GFR, EST NON AFRICAN AMERICAN: 28 mL/min — AB (ref >60)
Glucose, Bld: 109 mg/dL — ABNORMAL HIGH (ref 65–99)
Potassium: 4.4 mmol/L (ref 3.5–5.1)
Sodium: 136 mmol/L (ref 135–145)
Total Bilirubin: 0.5 mg/dL (ref 0.3–1.2)
Total Protein: 6.4 g/dL — ABNORMAL LOW (ref 6.5–8.1)

## 2017-11-06 MED ORDER — ENOXAPARIN SODIUM 30 MG/0.3ML ~~LOC~~ SOLN
30.0000 mg | SUBCUTANEOUS | Status: DC
  Administered 2017-11-07: 30 mg via SUBCUTANEOUS
  Filled 2017-11-06: qty 0.3

## 2017-11-06 MED FILL — Amiodarone HCl Inj 150 MG/3ML (50 MG/ML): INTRAVENOUS | Qty: 6 | Status: AC

## 2017-11-06 MED FILL — Heparin Sod (Porcine)-NaCl IV Soln 1000 Unit/500ML-0.9%: INTRAVENOUS | Qty: 1000 | Status: AC

## 2017-11-06 NOTE — Evaluation (Signed)
Physical Therapy Evaluation Patient Details Name: Randy Fitzpatrick MRN: [id number] DOB: 30-Jan-1925 Today's Date: 11/06/2017   History of Present Illness  Pt is a 84 y/o male admitted secondary to STEMI. Pt is s/p heart cath with PCI. PMH includes CAD, DM, and HTN.   Clinical Impression  Pt admitted secondary to problem above with deficits below. Pt tolerated gait well requiring min guard A for mobility using RW. Educated about use of RW to increase safety at home. Pt reports daughter is setting up intermittent assist for pt upon d/c. Pt eager to d/c home. Will continue to follow acutely to maximize functional mobility independence and safety.     Follow Up Recommendations Home health PT;Supervision - Intermittent    Equipment Recommendations  Rolling walker with 5" wheels    Recommendations for Other Services OT consult     Precautions / Restrictions Precautions Precautions: Fall Restrictions Weight Bearing Restrictions: No      Mobility  Bed Mobility               General bed mobility comments: In chair upon entry.   Transfers Overall transfer level: Needs assistance Equipment used: Rolling walker (2 wheeled) Transfers: Sit to/from Stand Sit to Stand: Min guard         General transfer comment: Min guard for steadying assist. Verbal cues for safe hand placement.   Ambulation/Gait Ambulation/Gait assistance: Min guard Ambulation Distance (Feet): 200 Feet Assistive device: Rolling walker (2 wheeled) Gait Pattern/deviations: Step-through pattern;Decreased stride length;Trunk flexed Gait velocity: Decreased    General Gait Details: Slow, mildly unsteady gait. Min guard for safety. Educated about use of RW at home to increase safety with mobility. Cues for upright posture.   Stairs            Wheelchair Mobility    Modified Rankin (Stroke Patients Only)       Balance Overall balance assessment: Needs assistance Sitting-balance support: No upper  extremity supported;Feet supported Sitting balance-Leahy Scale: Good     Standing balance support: Bilateral upper extremity supported;During functional activity Standing balance-Leahy Scale: Poor Standing balance comment: Reliant on BUE support.                              Pertinent Vitals/Pain Pain Assessment: No/denies pain    Home Living Family/patient expects to be discharged to:: Private residence Living Arrangements: Alone Available Help at Discharge: Family;Available PRN/intermittently Type of Home: House Home Access: Stairs to enter Entrance Stairs-Rails: None Entrance Stairs-Number of Steps: 1 Home Layout: One level Home Equipment: Cane - single point;Cane - quad      Prior Function Level of Independence: Independent               Hand Dominance   Dominant Hand: Right    Extremity/Trunk Assessment   Upper Extremity Assessment Upper Extremity Assessment: Defer to OT evaluation    Lower Extremity Assessment Lower Extremity Assessment: Generalized weakness    Cervical / Trunk Assessment Cervical / Trunk Assessment: Kyphotic  Communication   Communication: HOH  Cognition Arousal/Alertness: Awake/alert Behavior During Therapy: WFL for tasks assessed/performed Overall Cognitive Status: Within Functional Limits for tasks assessed                                        General Comments General comments (skin integrity, edema, etc.): VSS throughout    Exercises  Assessment/Plan    PT Assessment Patient needs continued PT services  PT Problem List Decreased strength;Decreased balance;Decreased mobility;Decreased knowledge of use of DME       PT Treatment Interventions DME instruction;Gait training;Stair training;Therapeutic exercise;Therapeutic activities;Functional mobility training;Patient/family education;Balance training    PT Goals (Current goals can be found in the Care Plan section)  Acute Rehab PT  Goals Patient Stated Goal: to go home tomorrow  PT Goal Formulation: With patient Time For Goal Achievement: 11/20/17 Potential to Achieve Goals: Good    Frequency Min 3X/week   Barriers to discharge Decreased caregiver support      Co-evaluation               AM-PAC PT "6 Clicks" Daily Activity  Outcome Measure Difficulty turning over in bed (including adjusting bedclothes, sheets and blankets)?: A Little Difficulty moving from lying on back to sitting on the side of the bed? : A Little Difficulty sitting down on and standing up from a chair with arms (e.g., wheelchair, bedside commode, etc,.)?: Unable Help needed moving to and from a bed to chair (including a wheelchair)?: A Little Help needed walking in hospital room?: A Little Help needed climbing 3-5 steps with a railing? : A Little 6 Click Score: 16    End of Session Equipment Utilized During Treatment: Gait belt Activity Tolerance: Patient tolerated treatment well Patient left: in chair;with call bell/phone within reach Nurse Communication: Mobility status PT Visit Diagnosis: Muscle weakness (generalized) (M62.81);Unsteadiness on feet (R26.81)    Time: 1653-1710 PT Time Calculation (min) (ACUTE ONLY): 17 min   Charges:   PT Evaluation $PT Eval Low Complexity: 1 Low     PT G Codes:        Leighton Ruff, PT, DPT  Acute Rehabilitation Services  Pager: [phone]   Rudean Hitt 11/06/2017, 6:23 PM

## 2017-11-06 NOTE — Progress Notes (Signed)
Case manager said pt refused HH (PT/etc) but said she will approach them about it again. Daughter has contacted [facility] to assist pt at home.   [place]  Marrah Vanevery, RN

## 2017-11-06 NOTE — Progress Notes (Signed)
Progress Note  Patient Name: Randy Fitzpatrick Date of Encounter: 11/06/2017  Primary Cardiologist:   Percival Spanish / Gwenlyn Found   Subjective   Doing well this morning.  No further chest pain.  No palpitations.  No dyspnea. Walked with [facility] yesterday and was noted to be somewhat weak with poor balance.  They are planning to have him walk with either a walker or cane today.  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 aspirin EC  81 mg Oral Daily  . atorvastatin  80 mg Oral q1800  . carvedilol  3.125 mg Oral BID WC  . enoxaparin (LOVENOX) injection  40 mg Subcutaneous Q24H  . levothyroxine  50 mcg Oral QAC breakfast  . sodium chloride flush  3 mL Intravenous Q12H  . ticagrelor  90 mg Oral BID   Continuous Infusions: . sodium chloride 10 mL/hr at 11/04/17 0700   PRN Meds: sodium chloride, acetaminophen, morphine injection, nitroGLYCERIN, ondansetron (ZOFRAN) IV, sodium chloride flush   Vital Signs    Vitals:   11/05/17 2329 11/06/17 0315 11/06/17 0418 11/06/17 0834  BP: (!) 118/47 (!) 137/52  (!) 129/55  Pulse: 63 60    Resp: 17 17    Temp: 97.9 F (36.6 C) 97.7 F (36.5 C)  97.8 F (36.6 C)  TempSrc: Oral Oral  Oral  SpO2: 100% 100%    Weight:   171 lb 1.2 oz (77.6 kg)   Height:        Intake/Output Summary (Last 24 hours) at 11/06/2017 1119 Last data filed at 11/06/2017 0952 Gross per 24 hour  Intake 1090 ml  Output 2350 ml  Net -1260 ml   Filed Weights   11/03/17 1730 11/05/17 0246 11/06/17 0418  Weight: 168 lb 10.4 oz (76.5 kg) 173 lb 1 oz (78.5 kg) 171 lb 1.2 oz (77.6 kg)    Telemetry    Sinus Rhythm/sinus bradycardia rate 50s to 60s.  First-degree AV block- Personally Reviewed  ECG    No new EKG Physical Exam   Physical Exam  Constitutional: He is oriented to person, place, and time. He appears well-developed and  well-nourished. No distress.   very healthy-appearing gentleman; appears he is younger than his stated age.  HENT:  Head: Normocephalic and atraumatic.    Hearing in place.  Hard of hearing  Neck: No JVD present. Carotid bruit is not present.  Cardiovascular: Normal rate, regular rhythm, S1 normal and S2 normal.  No extrasystoles are present. PMI is not displaced. Exam reveals distant heart sounds and decreased pulses (Still palpable pedal pulses but decreased). Exam reveals no gallop and no friction rub.  Murmur heard.  Medium-pitched blowing holosystolic murmur is present with a grade of 1/6 at the apex. Pulmonary/Chest: Effort normal and breath sounds normal. No respiratory distress. He has no wheezes. He has no rales.  Abdominal: Soft. Bowel sounds are normal. He exhibits no distension. There is no tenderness. There is no rebound.  Musculoskeletal: Normal range of motion. He exhibits edema (-Trace to 1+ bilateral lower extremity edema).  Neurological: He is alert and oriented to person, place, and time.  Hard of hearing  Skin: Skin is warm and dry. No rash noted. No erythema.   mild venous stasis Changes noted left greater than right lower extremity  Psychiatric: He has a normal mood and affect. His behavior is  normal. Judgment and thought content normal.  Vitals reviewed.   Labs    Chemistry Recent Labs  Lab 11/03/17 1530 11/04/17 0242  NA 140 138  K 4.2 4.2  CL 105 105  CO2  --  22  GLUCOSE 142* 166*  BUN 21* 15  CREATININE 1.40* 1.50*  CALCIUM  --  9.1  GFRNONAA  --  39*  GFRAA  --  45*  ANIONGAP  --  11     Hematology Recent Labs  Lab 11/03/17 1530 11/04/17 0242  WBC  --  8.8  RBC  --  3.32*  HGB 11.2* 11.3*  HCT 33.0* 33.8*  MCV  --  101.8*  MCH  --  34.0  MCHC  --  33.4  RDW  --  13.7  PLT  --  210    Cardiac Enzymes Recent Labs  Lab 11/03/17 2013 11/04/17 0242 11/04/17 0823  TROPONINI >65.00* >65.00* >65.00*   No results for input(s): TROPIPOC  in the last 168 hours.   BNPNo results for input(s): BNP, PROBNP in the last 168 hours.   DDimer No results for input(s): DDIMER in the last 168 hours.   Radiology    No results found.  Cardiac Studies    Cath-PCI 11/03/2017: mRCA 80% & dRCA 100% -- DES PCI; ostial & distal LM 70%, dOM2 100% => plan is Med Rx for now  PCI of p-dRCA: overlapping DES: pRCA3.0 x 32, mRCA 3.0 x 16 (3.25 mm), rRCA 2.5 x 12  Echo: EF 45-50%. Inferior HK. Mild LV dilation. GR 2 DD. Mod MR with severe MAC.  Patient Profile     84 year old gentleman with history of hyperlipidemia, hypothyroidism transferred from [facility] as a code STEMI.Patient was admitted 11/03/17 .  He developed chest tightness around 1:45 in the afternoon while sitting on the couch.  Was associated with some shortness of breath.  He presented to [facility] -- > was found to have Inferior ST segment elevation --> transferred to [facility] for further Emergent Cath/PCI.  Cardiac cath yesterday revealed a mid RCA stenosis of 80% followed by distal RCA 100% stenosis.  He had successful stent placement to the RCA.  There was also a left main stenosis of 70%.  Second OM 100% stenosis.  Initial Troponin levels were markedly elevated above 65.  Total cholesterol this is 224.  LDL is 139.  Triglyceride level is 263.  His HDL is 32.  Hemoglobin A1c is 6.2.   Assessment & Plan  Principal Problem:   Acute ST elevation myocardial infarction (STEMI) of inferior wall (HCC) Active Problems:   Coronary artery disease involving native coronary artery of native heart with unstable angina pectoris (HCC)   Left main coronary artery disease   Hyperlipidemia with target LDL less than 70   Type 2 diabetes mellitus with complication, without long-term current use of insulin (HCC)   Moderate mitral regurgitation by prior echocardiogram   Pressure injury of skin   1.  Inferior STEMI/CAD with UA/LM CAD -- admitted with an inferior  wall ST segment elevation myocardial infarction --> PCI of culprit lesion in RCA. right coronary artery.  Also noted 60 to 70% left main stenosis which we will treat medically. Mild Ischemic CM with Mod MR  Continue ASA/Brilinta -if not able to tolerate, would convert to Plavix  High continue high-dose statin  Tolerating low-dose carvedilol  2.  Hypertension (with MR):  Remai blood pressure more stable start carvedilol  Resting bradycardia precludes  titrating up carvedilol.    Consider low dose ARB in Outpatient follow-up -we will hold off on starting today.    3.  Hyperlipidemia: His total cholesterol is 224.  His LDL is 139.  Triglyceride level is 236.  HDL is 32.    Currently on high-dose atorvastatin.  Would expect reducing dose after several months.  .  4.  Has diagnosis of diabetes, but A1c is 6.2.  Conservative management with lifestyle modification  On Synthroid for baseline hypothyroidism On subcu Lovenox for DVT prophylaxis, -recommend support stockings on discharge   Anticipate discharge home tomorrow.  He has transfer orders to telemetry for today. Continue with cardiac rehab ambulation/evaluation.   Would like to potentially set up home health Rehab/PT and occasional nursing evaluation     For questions or updates, please contact [place] Please consult [email or web address] for contact info under Cardiology/STEMI.      Signed, Glenetta Hew, MD  11/06/2017, 11:19 AM

## 2017-11-06 NOTE — Care Management Note (Addendum)
Case Management Note  Patient Details  Name: Randy Fitzpatrick MRN: [id number] Date of Birth: 28-Oct-1924  Subjective/Objective:     Pt admitted with STEMi                Action/Plan:   PTA independent from home alone - pt is very active.  Pt independently walking in room and denied need for DME/HH in the home.  Pt will discharge home on Brilinta - CM submitted benefit check.  CM provided free 30card and informed pt/daughter of copay - pt denied hardship to affording copa below for Brilinta.  CM contacted pharmacy of choice [facility] in [place] can get prescription filled today.    Expected Discharge Date:                  Expected Discharge Plan:  Home/Self Care(independent from home alone)  In-House Referral:     Discharge planning Services  CM Consult  Post Acute Care Choice:    Choice offered to:     DME Arranged:    DME Agency:     HH Arranged:    HH Agency:     Status of Service:     If discussed at [employer], dates discussed:    Additional Comments: 11/06/2017 PT and RW recommended by Cardiac Rehab.  Pt informed CM that he does not have preference for [facility] agency - pt requested CM to contact agencies that service [place] area.  CM contacted multiple agencies that were not able to accept referral, [facility] was able to accept referral (Pt in agreement with [facility]).  Pt given choice for DME - pt chose [facility] - agency contacted and referral accepted   Brilinta Benefit check BRILINTA  90 MG BID  COVER- YES  CO-PAY- $ 368.98  TIER- 4 DRUG  PRIOR APPROVAL- NO  DEDUCTIBLE-NOT MET - WHEN MET CO-PAY- $132.82  Maryclare Labrador, RN 11/06/2017, 10:31 AM

## 2017-11-06 NOTE — Progress Notes (Signed)
CARDIAC REHAB PHASE I   PRE:  Rate/Rhythm: 64 SR    BP: sitting 161/54    SaO2: 100 RA  MODE:  Ambulation: 340 ft   POST:  Rate/Rhythm: 72 SR    BP: sitting 160/67     SaO2: 99 RA  Pt stood independently but quite unsteady walking with 4 pronged cane. He lacked consistency with his steps and kept stepping very close to cane prongs. After walking in room and 10 ft in hall, pt agreed to use RW. Much steadier with RW and able to be independent. No c/o with slow pace. He loves his home and is eager to be independent again. 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 He now agrees to a RW and HHPT. He would benefit from PT here in hospital for initial eval and balance testing. Ed completed with pt. He understands the need for Brilinta/ASA. Encouraged walking x3 a day. Discussed NTG and CRPII. Will send referral to [facility].  [place], ACSM 11/06/2017 2:08 PM

## 2017-11-07 DIAGNOSIS — N183 Chronic kidney disease, stage 3 (moderate): Secondary | ICD-10-CM

## 2017-11-07 DIAGNOSIS — N179 Acute kidney failure, unspecified: Secondary | ICD-10-CM | POA: Diagnosis present

## 2017-11-07 LAB — BASIC METABOLIC PANEL
ANION GAP: 6 (ref 5–15)
BUN: 36 mg/dL — AB (ref 6–20)
CO2: 24 mmol/L (ref 22–32)
Calcium: 8.4 mg/dL — ABNORMAL LOW (ref 8.9–10.3)
Chloride: 106 mmol/L (ref 101–111)
Creatinine, Ser: 1.96 mg/dL — ABNORMAL HIGH (ref 0.61–1.24)
GFR, EST AFRICAN AMERICAN: 33 mL/min — AB (ref >60)
GFR, EST NON AFRICAN AMERICAN: 28 mL/min — AB (ref >60)
Glucose, Bld: 153 mg/dL — ABNORMAL HIGH (ref 65–99)
Potassium: 4.2 mmol/L (ref 3.5–5.1)
Sodium: 136 mmol/L (ref 135–145)

## 2017-11-07 MED ORDER — SODIUM CHLORIDE 0.9 % IV SOLN
INTRAVENOUS | Status: DC
  Administered 2017-11-08: via INTRAVENOUS

## 2017-11-07 MED ORDER — SODIUM CHLORIDE 0.9 % IV SOLN
INTRAVENOUS | Status: AC
  Administered 2017-11-07: 11:00:00 via INTRAVENOUS

## 2017-11-07 NOTE — Care Management Note (Addendum)
Case Management Note  Patient Details  Name: Randy Fitzpatrick MRN: [id number] Date of Birth: 23-Mar-1925  Subjective/Objective:     Pt admitted with STEMi                Action/Plan:   PTA independent from home alone - pt is very active.  Pt independently walking in room and denied need for DME/HH in the home.  Pt will discharge home on Brilinta - CM submitted benefit check.  CM provided free 30card and informed pt/daughter of copay - pt denied hardship to affording copa below for Brilinta.  CM contacted pharmacy of choice [facility] in [place] can get prescription filled today.    Expected Discharge Date:                  Expected Discharge Plan:  Home/Self Care(independent from home alone)  In-House Referral:     Discharge planning Services  CM Consult  Post Acute Care Choice:    Choice offered to:     DME Arranged:    DME Agency:     HH Arranged:  PT, RN, OT HH Agency:  [facility]  Status of Service:  In process, will continue to follow  If discussed at Long Length of Stay Meetings, dates discussed:    Additional Comments: 11/07/2017  HHRN/OT added for [facility] services - [facility] informed CM that services will start Friday 5/17.  CM requested HH/face to face orders.  Family working with private care PCS services to provide additional support in the home  PT and RW recommended by Cardiac Rehab.  Pt informed CM that he does not have preference for [facility] agency - pt requested CM to contact agencies that service [place] area.  CM contacted multiple agencies that were not able to accept referral, [facility] was able to accept referral (Pt in agreement with [facility]).  Pt given choice for DME - pt chose [facility] - agency contacted and referral accepted   Brilinta Benefit check BRILINTA  90 MG BID  COVER- YES  CO-PAY- $ 368.98  TIER- 4 DRUG  PRIOR APPROVAL- NO  DEDUCTIBLE-NOT MET - WHEN MET CO-PAY- $132.82  Maryclare Labrador, RN 11/07/2017, 2:31 PM

## 2017-11-07 NOTE — Evaluation (Signed)
Occupational Therapy Evaluation Patient Details Name: Randy Fitzpatrick MRN: [id number] DOB: Dec 20, 1924 Today's Date: 11/07/2017    History of Present Illness Pt is a 84 y/o male admitted secondary to STEMI. Pt is s/p heart cath with PCI. PMH includes CAD, DM, and HTN.    Clinical Impression   PTA, pt was living alone and was independent. Pt currently requiring ADLs and functional mobility at supervision-Min Guard level for safety. Pt presenting with decreased endurance and balance, but presents near baseline functional. Educating pt and family on use of DME for shower seat for energy conservation during bathing; pt declined at this time. Providing information on obtaining DME if needed after dc. Pt would benefit from further acute OT to facilitate safe dc. Recommend dc to home with HHOT for further OT to optimize safety, independence with ADLs, and return to PLOF.      Follow Up Recommendations  Home health OT;Supervision/Assistance - 24 hour    Equipment Recommendations  None recommended by OT    Recommendations for Other Services PT consult     Precautions / Restrictions Precautions Precautions: Fall Restrictions Weight Bearing Restrictions: Yes Other Position/Activity Restrictions: No pushing, pulling, or lifting due to stent placement      Mobility Bed Mobility               General bed mobility comments: In chair upon entry.   Transfers Overall transfer level: Needs assistance Equipment used: Rolling walker (2 wheeled) Transfers: Sit to/from Stand Sit to Stand: Min guard         General transfer comment: Min guard for steadying assist. Verbal cues for safe hand placement.     Balance Overall balance assessment: Needs assistance Sitting-balance support: No upper extremity supported;Feet supported Sitting balance-Leahy Scale: Good     Standing balance support: Bilateral upper extremity supported;During functional activity Standing balance-Leahy Scale:  Fair Standing balance comment: Able to perform groomign tasks at sink without UE support                           ADL either performed or assessed with clinical judgement   ADL Overall ADL's : Needs assistance/impaired Eating/Feeding: Set up;Sitting   Grooming: Set up;Supervision/safety;Standing;Oral care;Wash/dry face;Brushing hair Grooming Details (indicate cue type and reason): Pt performing grooming tasks at sink with supervision for safety Upper Body Bathing: Set up;Supervision/ safety;Sitting   Lower Body Bathing: Min guard;Sit to/from stand   Upper Body Dressing : Set up;Supervision/safety;Sitting Upper Body Dressing Details (indicate cue type and reason): donned second gon like jacket Lower Body Dressing: Min guard;Sit to/from stand Lower Body Dressing Details (indicate cue type and reason): Min Guard for safety in standing. Able to adjust socks without difficulty by reaching forward Toilet Transfer: Min guard;Ambulation;RW(Simulated in room)           Functional mobility during ADLs: Supervision/safety;Rolling walker General ADL Comments: Pt demonstrating decreased endurance and strength. Pt highly independent PTA. Discussed using a shower seat or 3N1 for energy conservation during bathing. Pt declined and said he did not need DME. Providing pt and daughter with information on obtaining DME if wanted after dc.     Vision Baseline Vision/History: Wears glasses Wears Glasses: At all times Patient Visual Report: No change from baseline       Perception     Praxis      Pertinent Vitals/Pain Pain Assessment: Faces Faces Pain Scale: No hurt Pain Intervention(s): Monitored during session     Hand Dominance Right  Extremity/Trunk Assessment Upper Extremity Assessment Upper Extremity Assessment: Generalized weakness   Lower Extremity Assessment Lower Extremity Assessment: Generalized weakness   Cervical / Trunk Assessment Cervical / Trunk  Assessment: Kyphotic   Communication Communication Communication: HOH   Cognition Arousal/Alertness: Awake/alert Behavior During Therapy: WFL for tasks assessed/performed Overall Cognitive Status: Within Functional Limits for tasks assessed                                     General Comments  VSS    Exercises     Shoulder Instructions      Home Living Family/patient expects to be discharged to:: Private residence Living Arrangements: Alone Available Help at Discharge: Family;Available PRN/intermittently Type of Home: House Home Access: Stairs to enter [employer] of Steps: 1 Entrance Stairs-Rails: None Home Layout: One level     Bathroom Shower/Tub: Teacher, early years/pre: Standard     Home Equipment: Cane - single point;Cane - quad   Additional Comments: Family hiring help for 4 hours but daughter says they can come more if needed      Prior Functioning/Environment Level of Independence: Independent                 OT Problem List: Decreased strength;Decreased activity tolerance;Impaired balance (sitting and/or standing);Decreased knowledge of use of DME or AE      OT Treatment/Interventions: Self-care/ADL training;Therapeutic exercise;Energy conservation;DME and/or AE instruction;Therapeutic activities;Patient/family education    OT Goals(Current goals can be found in the care plan section) Acute Rehab OT Goals Patient Stated Goal: Go home today OT Goal Formulation: With patient/family Time For Goal Achievement: 11/21/17 Potential to Achieve Goals: Good ADL Goals Pt Will Perform Grooming: with modified independence;standing Pt Will Perform Lower Body Dressing: with modified independence;sit to/from stand Pt Will Transfer to Toilet: with modified independence;regular height toilet;ambulating Pt Will Perform Tub/Shower Transfer: with supervision;Tub transfer;ambulating;rolling walker  OT Frequency: Min 2X/week    Barriers to D/C:            Co-evaluation              AM-PAC PT "6 Clicks" Daily Activity     Outcome Measure Help from another person eating meals?: None Help from another person taking care of personal grooming?: A Little Help from another person toileting, which includes using toliet, bedpan, or urinal?: A Little Help from another person bathing (including washing, rinsing, drying)?: A Little Help from another person to put on and taking off regular upper body clothing?: None Help from another person to put on and taking off regular lower body clothing?: A Little 6 Click Score: 20   End of Session Equipment Utilized During Treatment: Gait belt;Rolling walker Nurse Communication: Mobility status  Activity Tolerance: Patient tolerated treatment well Patient left: in chair;with call bell/phone within reach;with family/visitor present  OT Visit Diagnosis: Unsteadiness on feet (R26.81);Other abnormalities of gait and mobility (R26.89);Muscle weakness (generalized) (M62.81)                Time: 1761-6073 OT Time Calculation (min): 34 min Charges:  OT General Charges $OT Visit: 1 Visit OT Evaluation $OT Eval Moderate Complexity: 1 Mod OT Treatments $Self Care/Home Management : 8-22 mins G-Codes:     Lyrah Bradt MSOT, OTR/L Acute Rehab Pager: [phone] Office: [place] 11/07/2017, 12:10 PM

## 2017-11-07 NOTE — Care Management Important Message (Signed)
Important Message  Patient Details  Name: Randy Fitzpatrick MRN: [id number] Date of Birth: Aug 10, 1924   Medicare Important Message Given:  Yes    Terion Hedman P Victora Irby 11/07/2017, 2:57 PM

## 2017-11-07 NOTE — Progress Notes (Signed)
Received verbal order from provider to start pt on NS@75  /hr from 12 am to 5 am.

## 2017-11-07 NOTE — Progress Notes (Signed)
Progress Note  Patient Name: Randy Fitzpatrick Date of Encounter: 11/07/2017  Primary Cardiologist: Patient requests Dr. Domenic Polite  Subjective   He feels pretty well today.  Still somewhat unsteady walking, but no chest pain or pressure.  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 aspirin EC  81 mg Oral Daily  . atorvastatin  80 mg Oral q1800  . carvedilol  3.125 mg Oral BID WC  . enoxaparin (LOVENOX) injection  30 mg Subcutaneous Q24H  . levothyroxine  50 mcg Oral QAC breakfast  . sodium chloride flush  3 mL Intravenous Q12H  . ticagrelor  90 mg Oral BID   Continuous Infusions: . sodium chloride 10 mL/hr at 11/04/17 0700  . sodium chloride 75 mL/hr at 11/07/17 1039   PRN Meds: sodium chloride, acetaminophen, morphine injection, nitroGLYCERIN, ondansetron (ZOFRAN) IV, sodium chloride flush   Vital Signs    Vitals:   11/07/17 0200 11/07/17 0407 11/07/17 0757 11/07/17 1234  BP: (!) 118/47 (!) 144/49 (!) 148/69 140/60  Pulse:  (!) 59 60 (!) 57  Resp: _0 Temp:  (!) 97.4 F (36.3 C) 97.9 F (36.6 C) 97.9 F (36.6 C)  TempSrc:  Oral Oral Oral  SpO2:   100% 100%  Weight:  169 lb 1.6 oz (76.7 kg)    Height:        Intake/Output Summary (Last 24 hours) at 11/07/2017 1241 Last data filed at 11/07/2017 5573 Gross per 24 hour  Intake 840 ml  Output 1350 ml  Net -510 ml   Filed Weights   11/05/17 0246 11/06/17 0418 11/07/17 0407  Weight: 173 lb 1 oz (78.5 kg) 171 lb 1.2 oz (77.6 kg) 169 lb 1.6 oz (76.7 kg)    Telemetry    Sinus Rhythm/sinus bradycardia rate 50s to 60s.  First-degree AV block- Personally Reviewed  ECG    No new EKG Physical Exam   Physical Exam  Constitutional: He is oriented to person, place, and time. He appears well-developed and well-nourished. No distress.   very healthy-appearing gentleman; appears he is  younger than his stated age.  HENT:  Head: Normocephalic and atraumatic.    Hearing in place.  Hard of hearing  Neck: No JVD present. Carotid bruit is not present.  Cardiovascular: Normal rate, regular rhythm, S1 normal and S2 normal.  No extrasystoles are present. PMI is not displaced. Exam reveals distant heart sounds and decreased pulses (Still palpable pedal pulses but decreased). Exam reveals no gallop and no friction rub.  Murmur heard.  Medium-pitched blowing holosystolic murmur is present with a grade of 1/6 at the apex. Pulmonary/Chest: Effort normal and breath sounds normal. No respiratory distress.  No wheezes rales or rhonchi  Abdominal: Soft. Bowel sounds are normal. He exhibits no distension. There is no tenderness. There is no rebound.  Mild truncal obesity  Musculoskeletal: Normal range of motion. He exhibits edema (-Trace to 1+ bilateral lower extremity edema).  Neurological: He is alert and oriented to person, place, and time.  Hard of hearing  Skin: Skin is warm and dry.   mild venous stasis Changes noted left greater than right lower extremity  Psychiatric: He has a normal mood and affect. His behavior is normal. Judgment and thought content normal.  Vitals reviewed.   Labs    Chemistry Recent Labs  Lab 11/03/17 1530  11/04/17 0242 11/06/17 1259  NA 140 138 136  K 4.2 4.2 4.4  CL 105 105 106  CO2  --  22 20*  GLUCOSE 142* 166* 109*  BUN 21* 15 34*  CREATININE 1.40* 1.50* 1.96*  CALCIUM  --  9.1 8.7*  PROT  --   --  6.4*  ALBUMIN  --   --  3.1*  AST  --   --  43*  ALT  --   --  28  ALKPHOS  --   --  46  BILITOT  --   --  0.5  GFRNONAA  --  39* 28*  GFRAA  --  45* 33*  ANIONGAP  --  11 10     Hematology Recent Labs  Lab 11/03/17 1530 11/04/17 0242 11/06/17 1259  WBC  --  8.8 7.7  RBC  --  3.32* 3.21*  HGB 11.2* 11.3* 10.8*  HCT 33.0* 33.8* 32.6*  MCV  --  101.8* 101.6*  MCH  --  34.0 33.6  MCHC  --  33.4 33.1  RDW  --  13.7 13.0  PLT  --   210 214    Cardiac Enzymes Recent Labs  Lab 11/03/17 2013 11/04/17 0242 11/04/17 0823  TROPONINI >65.00* >65.00* >65.00*   No results for input(s): TROPIPOC in the last 168 hours.   BNPNo results for input(s): BNP, PROBNP in the last 168 hours.   DDimer No results for input(s): DDIMER in the last 168 hours.   Radiology    No results found.  Cardiac Studies    Cath-PCI 11/03/2017: mRCA 80% & dRCA 100% -- DES PCI; ostial & distal LM 70%, dOM2 100% => plan is Med Rx for now  PCI of p-dRCA: overlapping DES: pRCA3.0 x 32, mRCA 3.0 x 16 (3.25 mm), rRCA 2.5 x 12  Echo: EF 45-50%. Inferior HK. Mild LV dilation. GR 2 DD. Mod MR with severe MAC.  Patient Profile     84 year old gentleman with history of hyperlipidemia, hypothyroidism transferred from [facility] as a code STEMI.Patient was admitted 11/03/17 .  He developed chest tightness around 1:45 in the afternoon while sitting on the couch.  Was associated with some shortness of breath.  He presented to [facility] -- > was found to have Inferior ST segment elevation --> transferred to [facility] for further Emergent Cath/PCI.  Cardiac cath yesterday revealed a mid RCA stenosis of 80% followed by distal RCA 100% stenosis.  He had successful stent placement to the RCA.  There was also a left main stenosis of 70%.  Second OM 100% stenosis.  Initial Troponin levels were markedly elevated above 65.  Total cholesterol this is 224.  LDL is 139.  Triglyceride level is 263.  His HDL is 32.  Hemoglobin A1c is 6.2.   Assessment & Plan  Principal Problem:   Acute ST elevation myocardial infarction (STEMI) of inferior wall (HCC) Active Problems:   Coronary artery disease involving native coronary artery of native heart with unstable angina pectoris (HCC)   Left main coronary artery disease   Acute on chronic renal failure (HCC)   Hyperlipidemia with target LDL less than 70   Type 2 diabetes mellitus with  complication, without long-term current use of insulin (HCC)   Moderate mitral regurgitation by prior echocardiogram   Pressure injury of skin   1.  Inferior STEMI/CAD with UA/LM CAD -- admitted with an inferior wall ST segment elevation myocardial infarction --> PCI of culprit lesion  in RCA. right coronary artery.  Also noted 60 to 70% left main stenosis which we will treat medically. Mild Ischemic CM with Mod MR  Continue ASA/Brilinta -if not able to tolerate, would convert to Plavix  High continue high-dose statin  Tolerating low-dose carvedilol -, but cannot tolerate further titration visit resting bradycardia  2.  Hypertension (with MR): Remains stable in the 774J systolic,  Resting bradycardia precludes titrating up carvedilol.    Consider low dose ARB in Outpatient follow-up 1once renal function is stabilized    3.  Hyperlipidemia: His total cholesterol is 224.  His LDL is 139.  Triglyceride level is 236.  HDL is 32.    Currently on high-dose atorvastatin.  Would expect reducing dose after several months.  .   4.  Has diagnosis of diabetes, but A1c is 6.2.  --Conservative management problems with  5.  Acute renal insufficiency -creatinine up to 1.96 from baseline of 1.4.  Suspect this is probably related to contrast nephropathy.  We will give gentle IV fluids today and recheck in the afternoon.  If it is stable/plateaued or improving, me may still discharged later on today, if not would hold for another day and gently hydrate overnight.  Regardless, he will need outpatient labs checked.  I have told him to liberalize his fluid intake as well.   On Synthroid for baseline hypothyroidism On subcu Lovenox for DVT prophylaxis, -recommend support stockings on discharge  Continue with cardiac rehab ambulation/evaluation --unfortunately, I do not know that outpatient cardiac rehab will be a good option for him since the closest program is in [place] as opposed to [place].  He  will definitely benefit from outpatient physical therapy and occupational therapy.  After long discussion he and his daughter both agree that this is probably the best option for him since he does live essentially alone as the primary caregiver for his wife.  Case Management will help arrange.    For questions or updates, please contact [place] Please consult [email or web address] for contact info under Cardiology/STEMI.      Signed, Glenetta Hew, MD  11/07/2017, 12:41 PM

## 2017-11-07 NOTE — Progress Notes (Signed)
CARDIAC REHAB PHASE I   Pt just walked with PT. Reviewed ed as pt needed reminders. Gave CRPII brochure. Daughter present. Will f/u as time allows. [place], ACSM 11/07/2017 11:56 AM

## 2017-11-07 NOTE — Progress Notes (Signed)
This Probation officer spoke w/Lindsay Mancel Bale, NP about discharge potential for patient today.  Daughter, [name], is at bedside.  She had planned for discharge today, as previously discussed w/physician yesterday.  Daughter lives in [place]; patient lives in [place], [place] and patient's wife is in dementia care in [place], [place].  Daughter very interested in taking patient home today w/follow-up by PCP for Creat/kidney function.  NP defers to Dr. Ellyn Hack for discharge decision.

## 2017-11-08 ENCOUNTER — Telehealth: Payer: Self-pay

## 2017-11-08 ENCOUNTER — Other Ambulatory Visit: Payer: Self-pay | Admitting: Cardiology

## 2017-11-08 ENCOUNTER — Encounter (HOSPITAL_COMMUNITY): Payer: Self-pay | Admitting: Cardiology

## 2017-11-08 DIAGNOSIS — N183 Chronic kidney disease, stage 3 (moderate): Secondary | ICD-10-CM

## 2017-11-08 DIAGNOSIS — N179 Acute kidney failure, unspecified: Secondary | ICD-10-CM

## 2017-11-08 DIAGNOSIS — N170 Acute kidney failure with tubular necrosis: Secondary | ICD-10-CM

## 2017-11-08 LAB — BASIC METABOLIC PANEL
ANION GAP: 9 (ref 5–15)
BUN: 31 mg/dL — AB (ref 6–20)
CHLORIDE: 107 mmol/L (ref 101–111)
CO2: 25 mmol/L (ref 22–32)
Calcium: 8.7 mg/dL — ABNORMAL LOW (ref 8.9–10.3)
Creatinine, Ser: 1.86 mg/dL — ABNORMAL HIGH (ref 0.61–1.24)
GFR calc Af Amer: 35 mL/min — ABNORMAL LOW (ref >60)
GFR, EST NON AFRICAN AMERICAN: 30 mL/min — AB (ref >60)
GLUCOSE: 117 mg/dL — AB (ref 65–99)
Potassium: 4.2 mmol/L (ref 3.5–5.1)
Sodium: 141 mmol/L (ref 135–145)

## 2017-11-08 LAB — CBC
HEMATOCRIT: 30.3 % — AB (ref 39.0–52.0)
HEMOGLOBIN: 9.7 g/dL — AB (ref 13.0–17.0)
MCH: 32.6 pg (ref 26.0–34.0)
MCHC: 32 g/dL (ref 30.0–36.0)
MCV: 101.7 fL — AB (ref 78.0–100.0)
Platelets: 206 10*3/uL (ref 150–400)
RBC: 2.98 MIL/uL — ABNORMAL LOW (ref 4.22–5.81)
RDW: 13 % (ref 11.5–15.5)
WBC: 6.2 10*3/uL (ref 4.0–10.5)

## 2017-11-08 MED ORDER — NITROGLYCERIN 0.4 MG SL SUBL: 0.4000 mg | SUBLINGUAL_TABLET | SUBLINGUAL | 1 refills | Status: DC | PRN

## 2017-11-08 MED ORDER — ATORVASTATIN CALCIUM 80 MG PO TABS: 80.0000 mg | ORAL_TABLET | Freq: Every day | ORAL | 2 refills | Status: DC

## 2017-11-08 MED ORDER — CARVEDILOL 3.125 MG PO TABS: 3.1250 mg | ORAL_TABLET | Freq: Two times a day (BID) | ORAL | 2 refills | Status: DC

## 2017-11-08 MED ORDER — ASPIRIN 81 MG PO TBEC: 81.0000 mg | DELAYED_RELEASE_TABLET | Freq: Every day | ORAL | Status: DC

## 2017-11-08 MED ORDER — TICAGRELOR 90 MG PO TABS: 90.0000 mg | ORAL_TABLET | Freq: Two times a day (BID) | ORAL | 1 refills | Status: DC

## 2017-11-08 NOTE — Telephone Encounter (Addendum)
Patient contacted regarding discharge from [facility] on 11/08/17  .  Patient understands to follow up with provider B. Strader PA-C on 11/23/2017 at 1 pm at [facility]. Patient understands discharge instructions? Yes Patient understands medications and regiment? Yes Patient understands to bring all medications to this visit?Yes  No answer,No machine    11/12/17 I clarified with pt that he does not have an apt with cardiac Rehab yet.They will review his referral and call him .

## 2017-11-08 NOTE — Telephone Encounter (Signed)
-----   Message from Desma Paganini sent at 11/08/2017 11:35 AM EDT ----- TOC scheduled w/ B. Strader on 11/23/17  Thank you! :)

## 2017-11-08 NOTE — Telephone Encounter (Signed)
Called pt. No answer, no voicemail. Unable to leave message.

## 2017-11-08 NOTE — Discharge Instructions (Addendum)
Acute Coronary Syndrome °Acute coronary syndrome (ACS) is a serious problem in which there is suddenly not enough blood and oxygen reaching the heart. ACS can result in chest pain or a heart attack. °What are the causes? °This condition may be caused by: °· A buildup of fat and cholesterol inside of the arteries (atherosclerosis). This is the most common cause. The buildup (plaque) can cause the blood vessels in your heart (coronary arteries) to become narrow or blocked. Plaque can also break off to form a clot. °· A coronary spasm. °· A tearing of the coronary artery (spontaneous coronary artery dissection). °· Low blood pressure (hypotension). °· An abnormal heart beat (arrhythmia). °· Using cocaine or methamphetamine. ° °What increases the risk? °The following factors may make you more likely to develop this condition: °· Age. °· History of chest pain, heart attack, or stroke. °· Being male. °· Family history of chest pain, heart disease, or stroke. °· Smoking. °· Inactivity. °· Being overweight. °· High cholesterol. °· High blood pressure (hypertension). °· Diabetes. °· Excessive alcohol use. ° °What are the signs or symptoms? °Common symptoms of this condition include: °· Chest pain. The pain may last long, or may stop and come back (recur). It may feel like: °? Crushing or squeezing. °? Tightness, pressure, fullness, or heaviness. °· Arm, neck, jaw, or back pain. °· Heartburn or indigestion. °· Shortness of breath. °· Nausea. °· Sudden cold sweats. °· Lightheadedness. °· Dizziness. °· Tiredness (fatigue). ° °Sometimes there are no symptoms. °How is this diagnosed? °This condition may be diagnosed through: °· An electrocardiogram (ECG). This test records the impulses of the heart. °· Blood tests. °· A CT scan of the chest. °· A coronary angiogram. This procedure checks for a blockage in the coronary arteries. ° °How is this treated? °Treatment for this condition may include: °· Oxygen. °· Medicines, such  as: °? Antiplatelet medicines and blood-thinning medicines, such as aspirin. These help prevent blood clots. °? Fibrinolytic therapy. This breaks apart a blood clot. °? Blood pressure medicines. °? Nitroglycerin. °? Pain medicine. °? Cholesterol medicine. °· A procedure called coronary angioplasty and stenting. This is done to widen a narrowed artery and keep it open. °· Coronary artery bypass surgery. This allows blood to pass the blockage to reach your heart. °· Cardiac rehabilitation. This is a program that helps improve your health and well-being. It includes exercise training, education, and counseling to help you recover. ° °Follow these instructions at home: °Eating and drinking °· Follow a heart-healthy, low-salt (sodium) diet. °· Use healthy cooking methods such as roasting, grilling, broiling, baking, poaching, steaming, or stir-frying. °· Talk to a dietitian to learn about healthy cooking methods and how to eat less sodium. °Medicines °· Take over-the-counter and prescription medicines only as told by your health care provider. °· Do not take these medicines unless your health care provider approves: °? Nonsteroidal anti-inflammatory drugs (NSAIDs), such as ibuprofen, naproxen, or celecoxib. °? Vitamin supplements that contain vitamin A or vitamin E. °? Hormone replacement therapy that contains estrogen. °Activity °· Join a cardiac rehabilitation program. °· Ask your health care provider: °? What activities and exercises are safe for you. °? If you should follow specific instructions about lifting, driving, or climbing stairs. °· If you are taking aspirin and another blood thinning medicine, avoid activities that are likely to result in an injury. The medicines can increase your risk of bleeding. °Lifestyle °· Do not use any products that contain nicotine or tobacco, such as cigarettes   and e-cigarettes. If you need help quitting, ask your health care provider.  If you drink alcohol and your health care  provider says it is okay to drink, limit your alcohol intake to no more than 1 drink per day. One drink equals 12 oz of beer, 5 oz of wine, or 1 oz of hard liquor.  Maintain a healthy weight. If you need to lose weight, do it in a way that has been approved by your health care provider. General instructions  Tell all your health care providers about your heart condition, including your dentist. Some medicines can increase your risk of arrhythmia.  Manage other health conditions, such as hypertension and diabetes. These conditions affect your heart.  Learn ways to manage stress.  Get screened for depression, and seek treatment if needed.  Monitor your blood pressure if told by your health care provider.  Keep your vaccinations up to date. Get the annual influenza vaccine.  Keep all follow-up visits as told by your health care provider. This is important. Contact a health care provider if:  You feel overwhelmed or sad.  You have trouble with your daily activities. Get help right away if:  You have pain in your chest, neck, arm, jaw, stomach, or back that recurs, and: ? Lasts more than a few minutes. ? Is not relieved by taking the [place] health care provider prescribed.  You have unexplained: ? Heavy sweating. ? Heartburn or indigestion. ? Shortness of breath. ? Difficulty breathing. ? Nausea or vomiting. ? Fatigue. ? Nervousness or anxiety. ? Weakness. ? Diarrhea. ? Dark stools or blood in the stool.  You have sudden lightheadedness or dizziness.  Your blood pressure is higher than 180/120  You faint.  You feel like hurting yourself or think about taking your own life. These symptoms may represent a serious problem that is an emergency. Do not wait to see if the symptoms will go away. Get medical help right away. Call your local emergency services (911 in the [place] Do not drive yourself to the clinic or hospital. Summary  Acute coronary syndrome (ACS) is a  when there is not enough blood and oxygen being supplied to the heart. ACS can result in chest pain or a heart attack.  Acute coronary syndrome is a medical emergency. If you have any symptoms of this condition, get help right away.  Treatment includes oxygen, medicines, and procedures to open the blocked arteries and restore blood flow. This information is not intended to replace advice given to you by your health care provider. Make sure you discuss any questions you have with your health care provider. Document Released: 06/12/2005 Document Revised: 07/14/2016 Document Reviewed: 07/14/2016 Elsevier Interactive Patient Education  2018 [place] What is cardiac rehabilitation? Cardiac rehabilitation is a treatment program that helps improve the health and well-being of people who have heart problems. Cardiac rehabilitation includes exercise training, education, and counseling to help you get stronger and return to an active lifestyle. This program can help you get better faster and reduce any future hospital stays. Why might I need cardiac rehabilitation?  Cardiac rehabilitation programs can help when you have or have had:  A heart attack.  Heart failure.  Peripheral artery disease.  Coronary artery disease.  Angina.  Lung or breathing problems.  Cardiac rehabilitation programs are also used when you have had:  Coronary artery bypass graft surgery.  Heart valve replacement.  Heart stent placement.  Heart transplant.  Aneurysm repair.  What  are the benefits of cardiac rehabilitation? Cardiac rehabilitation can help:  Reduce problems like chest pain and trouble breathing.  Change risk factors that contribute to heart disease, such as: ? Smoking. ? High blood pressure. ? High cholesterol. ? Diabetes. ? Being out of shape or not active. ? Weighing more than 30% higher than your ideal weight. ? Diet.  Improve your mental outlook so you  feel: ? More hopeful. ? Better about yourself. ? More confident about taking care of yourself.  Get support from health experts as well as other people with similar problems.  Learn how to manage and understand your medicines.  Teach your family about your condition and how to participate in your recovery.  What happens in cardiac rehabilitation? You will be assessed by a cardiac rehabilitation team. They will check your health history and do a physical exam. You may need blood tests, stress tests, and other evaluations to make sure that you are ready to start cardiac rehabilitation. The cardiac rehabilitation team works with you to make a plan based on your health and goals. Your program will be tailored to fit you and your needs and may change as you progress. You may work with a health care team that includes:  Doctors.  Nurses.  Dietitians.  Psychologists.  Exercise specialists.  Physical and occupational therapists.  What are the phases of cardiac rehabilitation? A cardiac rehabilitation program is often divided into phases. You advance from one phase to the next. Phase One This phase starts while you are still in the hospital. You may start by walking in your room and then in the hall. You may start some simple exercises with a therapist. Phase Two This phase begins when you go home or to another facility. This phase may last 8-12 weeks. You will travel to a cardiac rehabilitation center or another place where rehabilitation is offered. You will slowly increase your activity level while being closely watched by a nurse or therapist. Exercises may include a combination of strength or resistance training and cardio or aerobic movement on a treadmill or other machines. Your condition will determine how often and how long these sessions last. In phase two, you may learn how to cook healthy meals, control your blood sugar, and manage your medicines. You may need help with  scheduling or planning how and when to take your medicines. If you have questions about your medicines, it is very important that you talk to your health care provider. Phase Three This phase continues for the rest of your life. There will be less supervision. You may still participate in cardiac rehabilitation activities or become part of a group in your community. You may benefit from talking about your experience with other people who are facing similar challenges. Get help right away if:  You have severe chest discomfort, especially if the pain is crushing or pressure-like and spreads to your arms, back, neck, or jaw. Do not wait to see if the pain will go away.  You have weakness or numbness in your face, arms, or legs, especially on one side of the body.  Your speech is slurred.  You are confused.  You have a sudden severe headache or loss of vision.  You have shortness of breath.  You are sweating and have nausea.  You feel dizzy or faint.  You are fatigued. These symptoms may represent a serious problem that is an emergency. Do not wait to see if the symptoms will go away. Get medical help  right away. Call your local emergency services (911 in the [place] Do not drive yourself to the hospital. This information is not intended to replace advice given to you by your health care provider. Make sure you discuss any questions you have with your health care provider. Document Released: 03/21/2008 Document Revised: 05/29/2016 Document Reviewed: 04/26/2015 Elsevier Interactive Patient Education  2018 [place].   Heart Attack A heart attack (myocardial infarction, MI) causes damage to the heart that cannot be fixed. A heart attack often happens when a blood clot or other blockage cuts blood flow to the heart. When this happens, certain areas of the heart begin to die. This causes the pain you feel during a heart attack. Follow these instructions at home:  Take medicine as told by  your doctor. You may need medicine to: ? Keep your blood from clotting too easily. ? Control your blood pressure. ? Lower your cholesterol. ? Control abnormal heart rhythms.  Change certain behaviors as told by your doctor. This may include: ? Quitting smoking. ? Being active. ? Eating a heart-healthy diet. Ask your doctor for help with this diet. ? Keeping a healthy weight. ? Keeping your diabetes under control. ? Lessening stress. ? Limiting how much alcohol you drink. Do not take these medicines unless your doctor says that you can:  Nonsteroidal anti-inflammatory drugs (NSAIDs). These include: ? Ibuprofen. ? Naproxen. ? Celecoxib.  Vitamin supplements that have vitamin A, vitamin E, or both.  Hormone therapy that contains estrogen with or without progestin.  Get help right away if:  You have sudden chest discomfort.  You have sudden discomfort in your: ? Arms. ? Back. ? Neck. ? Jaw.  You have shortness of breath at any time.  You have sudden sweating or clammy skin.  You feel sick to your stomach (nauseous) or throw up (vomit).  You suddenly get light-headed or dizzy.  You feel your heart beating fast or skipping beats. These symptoms may be an emergency. Do not wait to see if the symptoms will go away. Get medical help right away. Call your local emergency services (911 in the [place] Do not drive yourself to the hospital. This information is not intended to replace advice given to you by your health care provider. Make sure you discuss any questions you have with your health care provider. Document Released: 12/12/2011 Document Revised: 11/18/2015 Document Reviewed: 08/15/2013 Elsevier Interactive Patient Education  2017 [employer].

## 2017-11-08 NOTE — Progress Notes (Signed)
CARDIAC REHAB PHASE I   PRE:  Rate/Rhythm: 60 SR PVCs  BP:  Supine:   Sitting: 143/61  Standing:    SaO2: 99%RA  MODE:  Ambulation: 340 ft   POST:  Rate/Rhythm: 72 SR PVCs  BP:  Supine:   Sitting: 154/64  Standing:    SaO2: 98%RA 0900-0920 Pt walked 340 ft on RA with rolling walker with steady gait. Tolerated well. Had already walked with PT. Denied CP. To recliner with call bell.   Graylon Good, RN BSN  11/08/2017 9:15 AM

## 2017-11-08 NOTE — Discharge Summary (Addendum)
Discharge Summary    Patient ID: Randy Fitzpatrick,  MRN: [id number], DOB/AGE: [birth year] 82 y.o.  Admit date: 11/03/2017 Discharge date: 11/09/2017  Primary Care Provider: Satira Sark Primary Cardiologist: Linna Hoff ([place])  Discharge Diagnoses    Principal Problem:   Acute ST elevation myocardial infarction (STEMI) of inferior wall [facility]) Active Problems:   Coronary artery disease involving native coronary artery of native heart with unstable angina pectoris ([place])   Left main coronary artery disease   Acute renal failure superimposed on stage 3 chronic kidney disease ([place])   Hyperlipidemia with target LDL less than 70   Type 2 diabetes mellitus with complication, without long-term current use of insulin (HCC)   Moderate mitral regurgitation by prior echocardiogram   Pressure injury of skin   Allergies No Known Allergies  Diagnostic Studies/Procedures    Cath: 11/03/17  Conclusion     Mid RCA lesion is 80% stenosed.  Dist RCA lesion is 100% stenosed.  Ost LM to Dist LM lesion is 70% stenosed.  2nd Mrg lesion is 100% stenosed.  A stent was successfully placed.  Post intervention, there is a 0% residual stenosis.  Post intervention, there is a 0% residual stenosis.     TTE: 11/04/17  Study Conclusions  - Left ventricle: Inferior wall hypokinesis The cavity size was   mildly dilated. Wall thickness was increased in a pattern of   moderate LVH. Systolic function was mildly reduced. The estimated   ejection fraction was in the range of 45% to 50%. Doppler   parameters are consistent with both elevated ventricular   end-diastolic filling pressure and elevated left atrial filling   pressure. - Aortic valve: There was trivial regurgitation. - Mitral valve: Severely calcified annulus. There was moderate   regurgitation. - Left atrium: The atrium was mildly dilated. - Atrial septum: No defect or patent foramen ovale was identified. - Pericardium,  extracardiac: A trivial pericardial effusion was   identified. _____________   History of Present Illness     Randy Fitzpatrick is a 84 yo male with PMH of HL, hypothyroidism, and BPH who was last evaluated by cardiology in 04/2012 and reported being very active at that time and tending to a garden. He reported a previous episode of chest discomfort and it was recommended he have a treadmill stress test for further evaluation but appears this was not performed.  In talking with the patient the day of admission, he reported developing acute chest discomfort around 1345 that afternoon while sitting on his couch. Reported associated dyspnea but denied any nausea, vomiting, or diaphoresis. He presented to [facility] and initial EKG showed ST elevation along the inferior leads, therefore CODE STEMI was activated.   He reported having 2 out of 10 chest discomfort at the time of arrival to [facility]. Reported that he is still active around his home and had not experienced any chest discomfort or dyspnea on exertion with activity over the past several weeks. No known history of CAD or stent placement. Does have known hyperlipidemia and hypothyroidism. He was taken to the cath emergently for cardiac cath.   Hospital Course     Underwent cath noted above with successful PCI/DES of the RCA that was complicated by dissection requiring total of 3 DES. Noted to have TIMI 3 flow. Also with calcified 60-70% distal LM and occluded marginal branch. Placed on DAPT with ASA/Brilinta for at least one year. Troponin peaked at >65. LDL noted at 139, and Hgb A1c 6.2. He  was started on high dose statin. Follow up echo showed EF of 45-50% with inferior hypokinesis. BB was initially held given his bradycardiac but was able to start low dose coreg. Worked well with cardiac rehab but recommended that PT evaluate him. He was transferred out of ICU on 5/13. Seen by PT who recommended HHPT, which the patient initially denied but did  eventually agree too. He was held an additional day when he developed a rise in his Cr from 1.4>>1.94. Was given gentle IVFs with improvement to 1.86. Avoided added ACEi/ARB given this. Given 30day Brilinta card at discharge, though copay may be too high. Consider switching to plavix as outpatient if this is the case.   General: Well developed, well nourished, older W male appearing in no acute distress. Head: Normocephalic, atraumatic.  Neck: Supple without bruits, JVD. Lungs:  Resp regular and unlabored, CTA. Heart: RRR, S1, S2, soft systolic murmur; no rub. Abdomen: Soft, non-tender, non-distended with normoactive bowel sounds.  Extremities: No clubbing, cyanosis, mild LE edema. Distal pedal pulses are 2+ bilaterally.  Neuro: Alert and oriented X 3. Moves all extremities spontaneously. Psych: Normal affect.  [name] was seen by Dr. Ellyn Hack and determined stable for discharge home. Follow up in the office has been arranged. Medications are listed below.   _____________  Discharge Vitals Blood pressure (!) 140/47, pulse 62, temperature 97.8 F (36.6 C), temperature source Oral, resp. rate (!) 21, height 5\' 10"  (1.778 m), weight 169 lb 5 oz (76.8 kg), SpO2 100 %.  Filed Weights   11/06/17 0418 11/07/17 0407 11/08/17 0500  Weight: 171 lb 1.2 oz (77.6 kg) 169 lb 1.6 oz (76.7 kg) 169 lb 5 oz (76.8 kg)    Labs & Radiologic Studies    CBC Recent Labs    11/08/17 0652  WBC 6.2  HGB 9.7*  HCT 30.3*  MCV 101.7*  PLT 144   Basic Metabolic Panel Recent Labs    11/07/17 1639 11/08/17 0652  NA 136 141  K 4.2 4.2  CL 106 107  CO2 24 25  GLUCOSE 153* 117*  BUN 36* 31*  CREATININE 1.96* 1.86*  CALCIUM 8.4* 8.7*   Liver Function Tests No results for input(s): AST, ALT, ALKPHOS, BILITOT, PROT, ALBUMIN in the last 72 hours. No results for input(s): LIPASE, AMYLASE in the last 72 hours. Cardiac Enzymes No results for input(s): CKTOTAL, CKMB, CKMBINDEX, TROPONINI in the last  72 hours. BNP Invalid input(s): POCBNP D-Dimer No results for input(s): DDIMER in the last 72 hours. Hemoglobin A1C No results for input(s): HGBA1C in the last 72 hours. Fasting Lipid Panel No results for input(s): CHOL, HDL, LDLCALC, TRIG, CHOLHDL, LDLDIRECT in the last 72 hours. Thyroid Function Tests No results for input(s): TSH, T4TOTAL, T3FREE, THYROIDAB in the last 72 hours.  Invalid input(s): FREET3 _____________  No results found. Disposition   Pt is being discharged home today in good condition.  Follow-up [facility] Follow up.   Why:  rolling walker Contact information: [place] [address] [place], [facility] Follow up.   Why:  Registered Nurse, Occupational Therapy and Physical Therapy Contact information: [place] [address] [phone]        Erma Heritage, PA-C Follow up on 11/23/2017.   Specialties:  Physician Assistant, Cardiology Why:  at 1pm for your follow up  appt.  Contact information: [place] [address] [phone]        [facility] Follow up on 11/13/2017.   Why:  please come in for follow up lab work.  Contact information: [address] [phone]         Discharge Instructions    Amb Referral to Cardiac Rehabilitation   Complete by:  As directed    Diagnosis:   Coronary Stents PTCA STEMI     Call MD for:  redness, tenderness, or signs of infection (pain, swelling, redness, odor or green/yellow discharge around incision site)   Complete by:  As directed    Diet - low sodium heart healthy   Complete by:  As directed    Discharge instructions   Complete by:  As directed    Radial Site Care Refer to this sheet in the next few weeks. These instructions provide you with information on caring for yourself after your  procedure. Your caregiver may also give you more specific instructions. Your treatment has been planned according to current medical practices, but problems sometimes occur. Call your caregiver if you have any problems or questions after your procedure. HOME CARE INSTRUCTIONS You may shower the day after the procedure.Remove the bandage (dressing) and gently wash the site with plain soap and water.Gently pat the site dry.  Do not apply powder or lotion to the site.  Do not submerge the affected site in water for 3 to 5 days.  Inspect the site at least twice daily.  Do not flex or bend the affected arm for 24 hours.  No lifting over 5 pounds (2.3 kg) for 5 days after your procedure.  Do not drive home if you are discharged the same day of the procedure. Have someone else drive you.  You may drive 24 hours after the procedure unless otherwise instructed by your caregiver.  What to expect: Any bruising will usually fade within 1 to 2 weeks.  Blood that collects in the tissue (hematoma) may be painful to the touch. It should usually decrease in size and tenderness within 1 to 2 weeks.  SEEK IMMEDIATE MEDICAL CARE IF: You have unusual pain at the radial site.  You have redness, warmth, swelling, or pain at the radial site.  You have drainage (other than a small amount of blood on the dressing).  You have chills.  You have a fever or persistent symptoms for more than 72 hours.  You have a fever and your symptoms suddenly get worse.  Your arm becomes pale, cool, tingly, or numb.  You have heavy bleeding from the site. Hold pressure on the site.   PLEASE DO NOT MISS ANY DOSES OF YOUR BRILINTA!!!!! Also keep a log of you blood pressures and bring back to your follow up appt. Please call the office with any questions.   Patients taking blood thinners should generally stay away from medicines like ibuprofen, Advil, Motrin, naproxen, and Aleve due to risk of stomach bleeding. You may take Tylenol as  directed or talk to your primary doctor about alternatives.  Follow up appt have been arranged. You will need to come in to have follow up lab work to check your kidney function on 5/21. Labs are done at [facility]. Please call if you have any questions.   Increase activity slowly   Complete by:  As directed       Discharge Medications     Medication  List    STOP taking these medications   doxazosin 4 MG tablet Commonly known as:  CARDURA     TAKE these medications   aspirin 81 MG EC tablet Take 1 tablet (81 mg total) by mouth daily.   atorvastatin 80 MG tablet Commonly known as:  LIPITOR Take 1 tablet (80 mg total) by mouth daily at 6 PM.   carvedilol 3.125 MG tablet Commonly known as:  COREG Take 1 tablet (3.125 mg total) by mouth 2 (two) times daily with a meal.   levothyroxine 50 MCG tablet Commonly known as:  SYNTHROID, LEVOTHROID Take 50 mcg by mouth daily.   nitroGLYCERIN 0.4 MG SL tablet Commonly known as:  NITROSTAT Place 1 tablet (0.4 mg total) under the tongue every 5 (five) minutes x 3 doses as needed for chest pain.   ticagrelor 90 MG Tabs tablet Commonly known as:  BRILINTA Take 1 tablet (90 mg total) by mouth 2 (two) times daily.        Aspirin prescribed at discharge?  Yes High Intensity Statin Prescribed? (Lipitor 40-80mg  or Crestor 20-40mg ): Yes Beta Blocker Prescribed? Yes For EF <40%, was ACEI/ARB Prescribed? No: EF ok, elevated Cr. ADP Receptor Inhibitor Prescribed? (i.e. Plavix etc.-Includes Medically Managed Patients): Yes For EF <40%, Aldosterone Inhibitor Prescribed? No: EF ok Was EF assessed during THIS hospitalization? Yes Was Cardiac Rehab II ordered? (Included Medically managed Patients): Yes   Outstanding Labs/Studies   FLP/LFTs in 6 weeks if tolerating statin. BMET 5/21.   Duration of Discharge Encounter   Greater than 30 minutes including physician time.  Signed, Reino Bellis NP-C 11/09/2017, 3:00 PM   I have  seen, examined and evaluated the patient this AM along with Reino Bellis, NP-C.  After reviewing all the available data and chart, we discussed the patients laboratory, study & physical findings as well as symptoms in detail. I agree with her findings, examination as well as DC Summary as per our discussion.    Remarkable elderly gentleman who suffered an Inferior STEMI - found to have RCA occlusion with extensive disease along with ostial LAD disease. The RCA was treated with PTCA-PCI. Plan is Med Rx for the remaining disease.    His d/c was delayed x 1 day due to acute renal failure (likely related to CIN) - now trending back to normal.   We will order a f/u CMP prior to f/u.  He is ready for d/c.  He will f/u with Dr. Domenic Polite in [place] (but initial f/u will be in [place]).   We will also arrange OP PT/OT.     Glenetta Hew, M.D., M.S. Interventional Cardiologist   Pager # [phone] Phone # [phone] [address] [place], [place] [address]

## 2017-11-08 NOTE — Progress Notes (Signed)
IV removed; educated on d/c instructions; belongings collected; pt will be wheeled out by NT once ready.   [place]  Crystalee Ventress, RN

## 2017-11-08 NOTE — Progress Notes (Signed)
Physical Therapy Treatment Patient Details Name: Randy Fitzpatrick MRN: [id number] DOB: [birth year] Today's Date: 11/08/2017    History of Present Illness Pt is a 84 y/o male admitted secondary to STEMI. Pt is s/p heart cath with PCI. PMH includes CAD, DM, and HTN.     PT Comments    Pt pleasant and willing to participate in therapy. Pt reports that his wife lives in nursing home and pt lives alone (to clear up confusion from previous notes). Pt able to ambulate 300 feet with RW and ascend/descend 10 stairs with use of R hand rail. Pt educated regarding needing someone to help him get in and out of house to allow for transport of RW into/out of home. HR 70 bpm at rest and 88 bpm post stairs.     Follow Up Recommendations  Home health PT;Supervision - Intermittent     Equipment Recommendations  Rolling walker with 5" wheels    Recommendations for Other Services       Precautions / Restrictions Precautions Precautions: Fall Restrictions Weight Bearing Restrictions: No    Mobility  Bed Mobility Overal bed mobility: Modified Independent             General bed mobility comments: Pt performed supine>sit with HOB elevated slightly without use of bed rail. Pt requires increased time to perform but able to perform bed mobility safely  Transfers Overall transfer level: Needs assistance Equipment used: Rolling walker (2 wheeled) Transfers: Sit to/from Stand Sit to Stand: Min guard         General transfer comment: Pt requires min guard A for sit<> stand. Pt had difficulty with first sit<>stand due to lack of forward trunk lean. With correction, pt able to perform sit<>stand with UE reliance on RW from bed in lowest position  Ambulation/Gait Ambulation/Gait assistance: Min guard Ambulation Distance (Feet): 300 Feet Assistive device: Rolling walker (2 wheeled) Gait Pattern/deviations: Step-through pattern;Decreased stride length;Trunk flexed Gait velocity: Decreased    General  Gait Details: Pt cued to step into walker and to stand upright. Pt ambulated 15 feet in room without AD and began reaching for bed and other objects for stability. Pt reports increased steadiness with RW.    Stairs Stairs: Yes Stairs assistance: Min guard Stair Management: One rail Right;Step to pattern Number of Stairs: 10 General stair comments: Pt educated about using step to pattern for increased stability and safety. Pt reports no increase in reported fatigue after performing stairs. HR increased to 88 bpm and quickly returned to mid 70s post stairs   Wheelchair Mobility    Modified Rankin (Stroke Patients Only)       Balance Overall balance assessment: Needs assistance Sitting-balance support: No upper extremity supported;Feet supported Sitting balance-Leahy Scale: Good     Standing balance support: Bilateral upper extremity supported;During functional activity Standing balance-Leahy Scale: Fair Standing balance comment: Able to stand and ambulate short distance without UE support but began reaching for objects for increased stability                            Cognition Arousal/Alertness: Awake/alert Behavior During Therapy: WFL for tasks assessed/performed Overall Cognitive Status: Within Functional Limits for tasks assessed                                 General Comments: Difficulty hearing      Exercises      General  Comments        Pertinent Vitals/Pain Pain Assessment: No/denies pain    Home Living                      Prior Function            PT Goals (current goals can now be found in the care plan section) Acute Rehab PT Goals Patient Stated Goal: Go home PT Goal Formulation: With patient Time For Goal Achievement: 11/20/17 Potential to Achieve Goals: Good Progress towards PT goals: Progressing toward goals    Frequency    Min 3X/week      PT Plan Current plan remains appropriate     Co-evaluation              AM-PAC PT "6 Clicks" Daily Activity  Outcome Measure  Difficulty turning over in bed (including adjusting bedclothes, sheets and blankets)?: A Little Difficulty moving from lying on back to sitting on the side of the bed? : A Little Difficulty sitting down on and standing up from a chair with arms (e.g., wheelchair, bedside commode, etc,.)?: A Little Help needed moving to and from a bed to chair (including a wheelchair)?: A Little Help needed walking in hospital room?: A Little Help needed climbing 3-5 steps with a railing? : A Little 6 Click Score: 18    End of Session Equipment Utilized During Treatment: Gait belt Activity Tolerance: Patient tolerated treatment well Patient left: in chair;with call bell/phone within reach Nurse Communication: Mobility status PT Visit Diagnosis: Muscle weakness (generalized) (M62.81);Unsteadiness on feet (R26.81)     Time: 3794-3276 PT Time Calculation (min) (ACUTE ONLY): 22 min  Charges:  $Gait Training: 8-22 mins                    G Codes:      Gabe Katrinka Herbison, SPT   [employer] 11/08/2017, 9:29 AM

## 2017-11-22 NOTE — Progress Notes (Signed)
Cardiology Office Note    Date:  11/23/2017   ID:  Randy Fitzpatrick., DOB 07/19/1924, MRN [id number]  PCP:  Ranae Palms, MD  Cardiologist: Wishes to follow-up with [name], MD    Chief Complaint  Patient presents with  . Hospitalization Follow-up    s/p STEMI    History of Present Illness:    Randy Fitzpatrick. is a 84 y.o. male with past medical history of HLD and Hypothyroidism who presents to the office today for hospital follow-up.  He was recently admitted to [facility] on 11/03/2017 as a transfer from [facility] as a CODE STEMI. He had developed chest discomfort the afternoon of his presentation which started while sitting on his couch. His initial EKG upon arrival to the ED showed ST elevation along the inferior leads and he was transferred for an emergent cardiac catheterization. This showed 70% Ost LM stenosis, 100% 2nd Mrg stenosis, 80% Prox RCA and 100% Distal RCA stenosis. He underwent successful PCI with DESx3 to the RCA (procedure complicated by wire dissection) with plans for medical management in regards to his LM stenosis. Was started on DAPT with ASA and Brilinta along with BB and statin therapy (LDL 139). Cyclic troponin values peaked at > 65. Echocardiogram showed his EF was mildly reduced at 45 to 50% and moderate MR.  He progressed well throughout admission and ambulated over 340 feet with cardiac rehab prior to discharge. Creatinine did peak at 1.94 during admission but was trending down at 1.86 on 11/08/2017 with plans for a repeat BMET at his follow-up visit.   In talking with the patient today, he reports overall doing well from a cardiac perspective since his most recent hospitalization. Denies any repeat episodes of chest pain. No recent dyspnea on exertion, orthopnea, PND, or lower extremity edema. He has been walking laps around his driveway for exercise and denies any anginal symptoms with this. 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  He reports good compliance with his medication regimen including ASA and Brilinta. His co-pay for Brilinta was going to be over $300 per month by review of Case Management's notes during his recent hospitalization.   He has a caregiver who stays with him a few hours each day and they have been following his blood pressure and heart rate closely. Heart rate has been in the mid 50's to 60's and he denies any associated lightheadedness or dizziness with this. BP has been variable with SBP ranging from the 120's to 101'B and diastolic readings in the 51'W to 70's.   Past Medical History:  Diagnosis Date  . Acute ST elevation myocardial infarction (STEMI) of inferior wall (HCC) 11/03/2017   PCI/DESx3 to RCA after wire dissection. EF 45-50%  . BPH (benign prostatic hyperplasia)   . Coronary artery disease involving native coronary artery of native heart with unstable angina pectoris ([place]) 11/05/2017  . Diabetes ([place])   . Hyperlipidemia with target LDL less than 70 11/05/2017  . Hypothyroid   . Left main coronary artery disease 11/05/2017  . Moderate mitral regurgitation by prior echocardiogram 11/05/2017    Past Surgical History:  Procedure Laterality Date  . CORONARY STENT INTERVENTION N/A 11/03/2017   Procedure: CORONARY STENT INTERVENTION;  Surgeon: Lorretta Harp, MD;  Location: [place] [facility];  Service: Cardiovascular;  Laterality: N/A;  . CORONARY/GRAFT ACUTE MI REVASCULARIZATION N/A 11/03/2017   Procedure: Coronary/Graft Acute MI Revascularization;  Surgeon:  Lorretta Harp, MD;  Location: [place] [facility];  Service: Cardiovascular;  Laterality: N/A;  . LEFT HEART CATH AND CORONARY ANGIOGRAPHY N/A 11/03/2017   Procedure: LEFT HEART CATH AND CORONARY ANGIOGRAPHY;  Surgeon: Lorretta Harp, MD;  Location: [place] [facility];  Service: Cardiovascular;  Laterality: N/A;  . STOMACH SURGERY     Treatment of an abscess    Current Medications: Outpatient  Medications Prior to Visit  Medication Sig Dispense Refill  . aspirin EC 81 MG EC tablet Take 1 tablet (81 mg total) by mouth daily.    Marland Kitchen atorvastatin (LIPITOR) 80 MG tablet Take 1 tablet (80 mg total) by mouth daily at 6 PM. 30 tablet 2  . carvedilol (COREG) 3.125 MG tablet Take 1 tablet (3.125 mg total) by mouth 2 (two) times daily with a meal. 60 tablet 2  . finasteride (PROSCAR) 5 MG tablet Take 5 mg by mouth daily.    Marland Kitchen levothyroxine (SYNTHROID, LEVOTHROID) 50 MCG tablet Take 50 mcg by mouth daily.     . nitroGLYCERIN (NITROSTAT) 0.4 MG SL tablet Place 1 tablet (0.4 mg total) under the tongue every 5 (five) minutes x 3 doses as needed for chest pain. 25 tablet 1  . tamsulosin (FLOMAX) 0.4 MG CAPS capsule Take 0.4 mg by mouth.    . ticagrelor (BRILINTA) 90 MG TABS tablet Take 1 tablet (90 mg total) by mouth 2 (two) times daily. 60 tablet 1   No facility-administered medications prior to visit.      Allergies:   Patient has no known allergies.   Social History   Socioeconomic History  . Marital status: Married    Spouse name: Not on file  . Number of children: 1  . Years of education: Not on file  . Highest education level: Not on file  Occupational History  . Not on file  Social Needs  . Financial resource strain: Not on file  . Food insecurity:    Worry: Not on file    Inability: Not on file  . Transportation needs:    Medical: Not on file    Non-medical: Not on file  Tobacco Use  . Smoking status: Former Smoker    Packs/day: 1.00    Years: 40.00    Pack years: 40.00    Types: Cigarettes    Last attempt to quit: 06/26/1982    Years since quitting: 35.4  . Smokeless tobacco: Never Used  Substance and Sexual Activity  . Alcohol use: No  . Drug use: No  . Sexual activity: Not on file  Lifestyle  . Physical activity:    Days per week: Not on file    Minutes per session: Not on file  . Stress: Not on file  Relationships  . Social connections:    Talks on phone: Not  on file    Gets together: Not on file    Attends religious service: Not on file    Active member of club or organization: Not on file    Attends meetings of clubs or organizations: Not on file    Relationship status: Not on file  Other Topics Concern  . Not on file  Social History Narrative  . Not on file     Family History:  The patient's family history includes Heart failure (age of onset: 79) in his father; Parkinsonism (age of onset: 43) in his mother.   Review of Systems:   Please see the history of present illness.  General:  No chills, fever, night sweats or weight changes.  Cardiovascular:  No dyspnea on exertion, edema, orthopnea, palpitations, paroxysmal nocturnal dyspnea. Positive for chest pain (now resolved).  Dermatological: No rash, lesions/masses Respiratory: No cough, dyspnea Urologic: No hematuria, dysuria Abdominal:   No nausea, vomiting, diarrhea, bright red blood per rectum, melena, or hematemesis Neurologic:  No visual changes, wkns, changes in mental status. All other systems reviewed and are otherwise negative except as noted above.   Physical Exam:    VS:  BP (!) 142/66   Pulse 63   Ht 5\' 10"  (1.778 m)   Wt 171 lb (77.6 kg)   SpO2 98%   BMI 24.54 kg/m    General: Well developed, well nourished elderly Caucasian male appearing in no acute distress. Head: Normocephalic, atraumatic, sclera non-icteric, no xanthomas, nares are without discharge.  Neck: No carotid bruits. JVD not elevated.  Lungs: Respirations regular and unlabored, without wheezes or rales.  Heart: Regular rate and rhythm. No S3 or S4.  No murmur, no rubs, or gallops appreciated. Abdomen: Soft, non-tender, non-distended with normoactive bowel sounds. No hepatomegaly. No rebound/guarding. No obvious abdominal masses. Msk:  Strength and tone appear normal for age. No joint deformities or effusions. Extremities: No clubbing or cyanosis. No edema.  Distal pedal pulses are 2+ bilaterally.  Radial cath site is stable without ecchymosis or evidence of a hematoma.  Neuro: Alert and oriented X 3. Moves all extremities spontaneously. No focal deficits noted. Psych:  Responds to questions appropriately with a normal affect. Skin: No rashes or lesions noted  Wt Readings from Last 3 Encounters:  11/23/17 171 lb (77.6 kg)  11/08/17 169 lb 5 oz (76.8 kg)  05/07/12 185 lb (83.9 kg)     Studies/Labs Reviewed:   EKG:  EKG is ordered today.  The ekg ordered today demonstrates NSR, HR 61, with 1st degree AV Block. RBBB and LAFB.   Recent Labs: 11/06/2017: ALT 28 11/08/2017: Hemoglobin 9.7; Platelets 206 11/23/2017: BUN 29; Creatinine, Ser 1.65; Potassium 4.6; Sodium 137   Lipid Panel    Component Value Date/Time   CHOL 224 (H) 11/04/2017 0242   TRIG 263 (H) 11/04/2017 0242   HDL 32 (L) 11/04/2017 0242   CHOLHDL 7.0 11/04/2017 0242   VLDL 53 (H) 11/04/2017 0242   LDLCALC 139 (H) 11/04/2017 0242    Additional studies/ records that were reviewed today include:   Cardiac Catheterization: 11/03/2017  Mid RCA lesion is 80% stenosed.  Dist RCA lesion is 100% stenosed.  Ost LM to Dist LM lesion is 70% stenosed.  2nd Mrg lesion is 100% stenosed.  A stent was successfully placed.  Post intervention, there is a 0% residual stenosis.  Post intervention, there is a 0% residual stenosis.  IMPRESSION: Successful PCI and drug-eluting stenting of a dominant RCA in the setting of an inferior STEMI complicated by guide related dissection with 3 drug-eluting stents.  He had excellent flow at the end of the case which was TIMI-3.  He did have a calcified 60 to 70% distal left main and occluded small marginal branch.  His LVEDP was 21.  A 2D echo will be performed.  His enzymes will be cycled.  He will need dual antiplatelet therapy for at least 12 months uninterrupted as well as beta-blockade and high-dose statin therapy.   Echocardiogram: 11/04/2017 Study Conclusions  - Left  ventricle: Inferior wall hypokinesis The cavity size was   mildly dilated. Wall thickness was increased in a pattern of  moderate LVH. Systolic function was mildly reduced. The estimated   ejection fraction was in the range of 45% to 50%. Doppler   parameters are consistent with both elevated ventricular   end-diastolic filling pressure and elevated left atrial filling   pressure. - Aortic valve: There was trivial regurgitation. - Mitral valve: Severely calcified annulus. There was moderate   regurgitation. - Left atrium: The atrium was mildly dilated. - Atrial septum: No defect or patent foramen ovale was identified. - Pericardium, extracardiac: A trivial pericardial effusion was   identified.  Assessment:    1. Coronary artery disease involving native coronary artery of native heart without angina pectoris   2. ST elevation myocardial infarction (STEMI) of inferior wall, subsequent episode of care ([place])   3. Essential hypertension   4. Hyperlipidemia LDL goal <70   5. AKI (acute kidney injury) ([place])      Plan:   In order of problems listed above:  1. CAD/Subsequent Episode of Care for Inferior STEMI - recently admitted for an Inferior STEMI with catheterization showing 70% Ost LM stenosis, 100% 2nd Mrg stenosis, 80% Prox RCA and 100% Distal RCA stenosis. He underwent successful PCI with DESx3 to the RCA with plans for medical management in regards to his LM stenosis. EF slightly reduced to 45 to 50% by echocardiogram. - He denies any recurrent chest pain since hospital discharge. Has been performing activities around his home without any recurrent anginal symptoms. - Will continue on dual antiplatelet therapy for at least 12 months but will switch from Brilinta to Plavix given his co-pay is going to be > $300 per month.  He will finish his current bottle of Brilinta and switch to Plavix at that time. Continue ASA 81 mg daily, beta-blocker, and statin.  2. HTN - BP was initially  elevated at 160/70 during today's visit, improving to 142/66 on recheck. BP has been variable when checked at home as outlined above. I have asked his caretaker to continue to follow blood pressure and to report back with results. If BP remains elevated, would be hesitant to further titrate Coreg given his heart rate has been in the 50's to 60's. Could consider the addition of a low-dose ARB in the setting of his cardiomyopathy if creatinine has improved by repeat BMET.   3. HLD - FLP during recent admission showed total cholesterol 224, triglycerides 263, HDL 32, and LDL 139. Goal LDL is now less than 70 in the setting of known CAD. - He has been started on Atorvastatin 80 mg daily. Will need repeat FLP and LFTs at the time of his office visit in 2 months.  4. Acute on Chronic Stage 3 CKD - Creatinine peaked at 1.96 on 11/07/2017 following his cardiac catheterization. This was trending down to 1.86 on 11/08/2017.  Will recheck a BMET today for further evaluation.   Medication Adjustments/Labs and Tests Ordered: Current medicines are reviewed at length with the patient today.  Concerns regarding medicines are outlined above.  Medication changes, Labs and Tests ordered today are listed in the Patient Instructions below. Patient Instructions  Medication Instructions:  Your physician has recommended you make the following change in your medication:  Stop taking Brilinta after current bottle is complete.  Start taking Plavix 75 mg Daily after you completed taking current Brilinta.   Labwork: Your physician recommends that you return for lab work today.   Testing/Procedures: NONE   Follow-Up: Your physician recommends that you schedule a follow-up appointment in: 2 Months with Dr.  McDowell.  Any Other Special Instructions Will Be Listed Below (If Applicable).  Your physician has requested that you regularly monitor and record your blood pressure readings at home. Please use the same machine at  the same time of day to check your readings and record them to bring to your follow-up visit.   If you need a refill on your cardiac medications before your next appointment, please call your pharmacy. Thank you for choosing [place] [facility]!       Signed, Erma Heritage, PA-C  11/23/2017 4:33 PM    [facility] [address] [place], [place] [address] Phone: [phone]

## 2017-11-23 ENCOUNTER — Other Ambulatory Visit (HOSPITAL_COMMUNITY)
Admission: RE | Admit: 2017-11-23 | Discharge: 2017-11-23 | Disposition: A | Discharge status: Home / Self Care | Payer: Medicare Other | Source: Ambulatory Visit | Attending: Student | Admitting: Student

## 2017-11-23 ENCOUNTER — Encounter: Payer: Self-pay | Admitting: Student

## 2017-11-23 ENCOUNTER — Ambulatory Visit (INDEPENDENT_AMBULATORY_CARE_PROVIDER_SITE_OTHER): Payer: Medicare Other | Admitting: Student

## 2017-11-23 VITALS — BP 142/66 | HR 63 | Ht 70.0 in | Wt 171.0 lb

## 2017-11-23 DIAGNOSIS — I10 Essential (primary) hypertension: Secondary | ICD-10-CM

## 2017-11-23 DIAGNOSIS — I2119 ST elevation (STEMI) myocardial infarction involving other coronary artery of inferior wall: Secondary | ICD-10-CM | POA: Diagnosis not present

## 2017-11-23 DIAGNOSIS — N179 Acute kidney failure, unspecified: Secondary | ICD-10-CM | POA: Diagnosis present

## 2017-11-23 DIAGNOSIS — E785 Hyperlipidemia, unspecified: Secondary | ICD-10-CM

## 2017-11-23 DIAGNOSIS — I2510 Atherosclerotic heart disease of native coronary artery without angina pectoris: Secondary | ICD-10-CM

## 2017-11-23 DIAGNOSIS — I2111 ST elevation (STEMI) myocardial infarction involving right coronary artery: Secondary | ICD-10-CM

## 2017-11-23 LAB — BASIC METABOLIC PANEL
ANION GAP: 7 (ref 5–15)
BUN: 29 mg/dL — ABNORMAL HIGH (ref 6–20)
CALCIUM: 8.8 mg/dL — AB (ref 8.9–10.3)
CHLORIDE: 107 mmol/L (ref 101–111)
CO2: 23 mmol/L (ref 22–32)
CREATININE: 1.65 mg/dL — AB (ref 0.61–1.24)
GFR calc non Af Amer: 35 mL/min — ABNORMAL LOW (ref >60)
GFR, EST AFRICAN AMERICAN: 40 mL/min — AB (ref >60)
Glucose, Bld: 104 mg/dL — ABNORMAL HIGH (ref 65–99)
Potassium: 4.6 mmol/L (ref 3.5–5.1)
SODIUM: 137 mmol/L (ref 135–145)

## 2017-11-23 MED ORDER — CLOPIDOGREL BISULFATE 75 MG PO TABS: 75.0000 mg | ORAL_TABLET | Freq: Every day | ORAL | 3 refills | Status: DC

## 2017-11-23 NOTE — Patient Instructions (Signed)
Medication Instructions:  Your physician has recommended you make the following change in your medication:  Stop taking Brilinta after current bottle is complete.  Start taking Plavix 75 mg Daily after you completed taking current Brilinta.    Labwork: Your physician recommends that you return for lab work today.    Testing/Procedures: NONE   Follow-Up: Your physician recommends that you schedule a follow-up appointment in: 2 Month with Dr. Domenic Polite.   Any Other Special Instructions Will Be Listed Below (If Applicable).  Your physician has requested that you regularly monitor and record your blood pressure readings at home. Please use the same machine at the same time of day to check your readings and record them to bring to your follow-up visit.     If you need a refill on your cardiac medications before your next appointment, please call your pharmacy. Thank you for choosing [place]!

## 2017-12-26 ENCOUNTER — Encounter (HOSPITAL_COMMUNITY)
Admission: RE | Admit: 2017-12-26 | Discharge: 2017-12-26 | Disposition: A | Discharge status: Home / Self Care | Payer: Medicare Other | Source: Ambulatory Visit | Attending: Cardiology | Admitting: Cardiology

## 2017-12-26 VITALS — BP 130/50 | HR 55 | Ht 70.0 in | Wt 167.3 lb

## 2017-12-26 DIAGNOSIS — Z87891 Personal history of nicotine dependence: Secondary | ICD-10-CM | POA: Insufficient documentation

## 2017-12-26 DIAGNOSIS — I2111 ST elevation (STEMI) myocardial infarction involving right coronary artery: Secondary | ICD-10-CM | POA: Insufficient documentation

## 2017-12-26 DIAGNOSIS — Z7982 Long term (current) use of aspirin: Secondary | ICD-10-CM | POA: Insufficient documentation

## 2017-12-26 DIAGNOSIS — Z79899 Other long term (current) drug therapy: Secondary | ICD-10-CM | POA: Insufficient documentation

## 2017-12-26 DIAGNOSIS — Z955 Presence of coronary angioplasty implant and graft: Secondary | ICD-10-CM | POA: Insufficient documentation

## 2017-12-26 DIAGNOSIS — E785 Hyperlipidemia, unspecified: Secondary | ICD-10-CM | POA: Diagnosis not present

## 2017-12-26 DIAGNOSIS — E119 Type 2 diabetes mellitus without complications: Secondary | ICD-10-CM | POA: Insufficient documentation

## 2017-12-26 NOTE — Progress Notes (Signed)
Cardiac/Pulmonary Rehab Medication Review by a Pharmacist  Does the patient  feel that his/her medications are working for him/her?  He feels good, he "guesses" they are  Has the patient been experiencing any side effects to the medications prescribed?  Yes, leg weakness.  Not sure if due to medication or "old age".  Does the patient measure his/her own blood pressure or blood glucose at home?  yes   Does the patient have any problems obtaining medications due to transportation or finances?   no  Understanding of regimen: good Understanding of indications: good Potential of compliance: excellent  Pharmacist comments: N/A  Pricilla Larsson 12/26/2017 8:40 AM

## 2017-12-26 NOTE — Progress Notes (Signed)
Daily Session Note  Patient Details  Name: Randy Fitzpatrick. MRN: [id number] Date of Birth: Dec 14, 1924 Referring Provider:     CARDIAC REHAB PHASE II ORIENTATION from 12/26/2017 in [place]  Referring Provider  Dr. Domenic Polite      Encounter Date: 12/26/2017  Check In: Session Check In - 12/26/17 0800      Check-In   Location  [facility]    Staff Present  Rhen Dossantos Angelina Pih, MS, EP, [facility], Exercise Physiologist;Gregory Luther Parody, BS, EP, Exercise Physiologist;Debra Wynetta Emery, RN, BSN    Supervising physician immediately available to respond to emergencies  See telemetry face sheet for immediately available MD    Medication changes reported      No    Fall or balance concerns reported     Yes    Comments  Patient states his legs are weak which makes him a little wobbly.     Tobacco Cessation  -- Quit 1984    Warm-up and Cool-down  Performed as group-led Higher education careers adviser Performed  Yes    VAD Patient?  No    PAD/SET Patient?  No      Pain Assessment   Currently in Pain?  No/denies    Pain Score  0-No pain    Multiple Pain Sites  No       Capillary Blood Glucose: No results found for this or any previous visit (from the past 24 hour(s)).    Social History   Tobacco Use  Smoking Status Former Smoker  . Packs/day: 1.00  . Years: 40.00  . Pack years: 40.00  . Types: Cigarettes  . Last attempt to quit: 06/26/1982  . Years since quitting: 35.5  Smokeless Tobacco Never Used    Goals Met:  Independence with exercise equipment Exercise tolerated well Personal goals reviewed No report of cardiac concerns or symptoms Strength training completed today  Goals Unmet:  Not Applicable  Comments: Check out: 1030   Dr. Kate Sable is Medical Director for [facility] and Pulmonary Rehab.

## 2017-12-26 NOTE — Progress Notes (Signed)
Cardiac Individual Treatment Plan  Patient Details  Name: Randy Fitzpatrick. MRN: [id number] Date of Birth: 06-28-1924 Referring Provider:     CARDIAC REHAB PHASE II ORIENTATION from 12/26/2017 in [place]  Referring Provider  Dr. Domenic Polite      Initial Encounter Date:    CARDIAC REHAB PHASE II ORIENTATION from 12/26/2017 in [place]  Date  12/26/17      Visit Diagnosis: Status post coronary artery stent placement  ST elevation myocardial infarction involving right coronary artery (HCC)  Patient's Home Medications on Admission:  Current Outpatient Medications:  .  aspirin EC 81 MG EC tablet, Take 1 tablet (81 mg total) by mouth daily., Disp: , Rfl:  .  atorvastatin (LIPITOR) 80 MG tablet, Take 1 tablet (80 mg total) by mouth daily at 6 PM., Disp: 30 tablet, Rfl: 2 .  carvedilol (COREG) 3.125 MG tablet, Take 1 tablet (3.125 mg total) by mouth 2 (two) times daily with a meal., Disp: 60 tablet, Rfl: 2 .  clopidogrel (PLAVIX) 75 MG tablet, Take 1 tablet (75 mg total) by mouth daily., Disp: 90 tablet, Rfl: 3 .  finasteride (PROSCAR) 5 MG tablet, Take 5 mg by mouth daily., Disp: , Rfl:  .  levothyroxine (SYNTHROID, LEVOTHROID) 50 MCG tablet, Take 50 mcg by mouth daily. , Disp: , Rfl:  .  nitroGLYCERIN (NITROSTAT) 0.4 MG SL tablet, Place 1 tablet (0.4 mg total) under the tongue every 5 (five) minutes x 3 doses as needed for chest pain., Disp: 25 tablet, Rfl: 1 .  tamsulosin (FLOMAX) 0.4 MG CAPS capsule, Take 0.4 mg by mouth daily. , Disp: , Rfl:   Past Medical History: Past Medical History:  Diagnosis Date  . Acute ST elevation myocardial infarction (STEMI) of inferior wall (HCC) 11/03/2017   PCI/DESx3 to RCA after wire dissection. EF 45-50%  . BPH (benign prostatic hyperplasia)   . Coronary artery disease involving native coronary artery of native heart with unstable angina pectoris ([place]) 11/05/2017  . Diabetes ([place])   . Hyperlipidemia  with target LDL less than 70 11/05/2017  . Hypothyroid   . Left main coronary artery disease 11/05/2017  . Moderate mitral regurgitation by prior echocardiogram 11/05/2017    Tobacco Use: Social History   Tobacco Use  Smoking Status Former Smoker  . Packs/day: 1.00  . Years: 40.00  . Pack years: 40.00  . Types: Cigarettes  . Last attempt to quit: 06/26/1982  . Years since quitting: 35.5  Smokeless Tobacco Never Used    Labs: Recent Chemical engineer    Labs for ITP Cardiac and Pulmonary Rehab Latest Ref Rng & Units 11/03/2017 11/04/2017   Cholestrol 0 - 200 mg/dL - 224(H)   LDLCALC 0 - 99 mg/dL - 139(H)   HDL >40 mg/dL - 32(L)   Trlycerides <150 mg/dL - 263(H)   Hemoglobin A1c 4.8 - 5.6 % 6.2(H) -   TCO2 22 - 32 mmol/L 24 -      Capillary Blood Glucose: Lab Results  Component Value Date   GLUCAP 105 (H) 11/05/2017   GLUCAP 191 (H) 11/04/2017     Exercise Target Goals: Date: 12/26/17  Exercise Program Goal: Individual exercise prescription set using results from initial 6 min walk test and THRR while considering  patient's activity barriers and safety.   Exercise Prescription Goal: Starting with aerobic activity 30 plus minutes a day, 3 days per week for initial exercise prescription. Provide home exercise prescription and guidelines that participant acknowledges  understanding prior to discharge.  Activity Barriers & Risk Stratification: Activity Barriers & Cardiac Risk Stratification - 12/26/17 1008      Activity Barriers & Cardiac Risk Stratification   Activity Barriers  Deconditioning;Balance Concerns    Cardiac Risk Stratification  High       6 Minute Walk: 6 Minute Walk    Row Name 12/26/17 1008         6 Minute Walk   Phase  Initial     Distance  800 feet     Distance % Change  0 %     Distance Feet Change  0 ft     Walk Time  6 minutes     # of Rest Breaks  0     MPH  1.51     METS  2.16     RPE  13     Perceived Dyspnea   9     VO2 Peak   4.31     Symptoms  No     Resting HR  55 bpm     Resting BP  130/50     Resting Oxygen Saturation   97 %     Exercise Oxygen Saturation  during 6 min walk  95 %     Max Ex. HR  88 bpm     Max Ex. BP  160/54     2 Minute Post BP  140/50        Oxygen Initial Assessment:   Oxygen Re-Evaluation:   Oxygen Discharge (Final Oxygen Re-Evaluation):   Initial Exercise Prescription: Initial Exercise Prescription - 12/26/17 1000      Date of Initial Exercise RX and Referring Provider   Date  12/26/17    Referring Provider  Dr. Domenic Polite      NuStep   Level  1    SPM  66    Minutes  15    METs  1.6      Arm Ergometer   Level  1    Watts  7    RPM  20    Minutes  20    METs  1.4      Prescription Details   Frequency (times per week)  3    Duration  Progress to 30 minutes of continuous aerobic without signs/symptoms of physical distress      Intensity   THRR 40-80% of Max Heartrate  [phone]    Ratings of Perceived Exertion  11-13    Perceived Dyspnea  0-4      Progression   Progression  Continue progressive overload as per policy without signs/symptoms or physical distress.      Resistance Training   Training Prescription  Yes    Weight  1    Reps  10-15       Perform Capillary Blood Glucose checks as needed.  Exercise Prescription Changes:   Exercise Comments:   Exercise Goals and Review:  Exercise Goals    Row Name 12/26/17 1010             Exercise Goals   Increase Physical Activity  Yes       Intervention  Provide advice, education, support and counseling about physical activity/exercise needs.;Develop an individualized exercise prescription for aerobic and resistive training based on initial evaluation findings, risk stratification, comorbidities and participant's personal goals.       Expected Outcomes  Short Term: Attend rehab on a regular basis to increase amount of physical activity.;Long Term: Exercising regularly  at least 3-5 days a  week.;Long Term: Add in home exercise to make exercise part of routine and to increase amount of physical activity.       Increase Strength and Stamina  Yes       Intervention  Develop an individualized exercise prescription for aerobic and resistive training based on initial evaluation findings, risk stratification, comorbidities and participant's personal goals.;Provide advice, education, support and counseling about physical activity/exercise needs.       Expected Outcomes  Short Term: Increase workloads from initial exercise prescription for resistance, speed, and METs.       Able to understand and use rate of perceived exertion (RPE) scale  Yes       Intervention  Provide education and explanation on how to use RPE scale       Expected Outcomes  Short Term: Able to use RPE daily in rehab to express subjective intensity level;Long Term:  Able to use RPE to guide intensity level when exercising independently       Able to understand and use Dyspnea scale  Yes       Intervention  Provide education and explanation on how to use Dyspnea scale       Expected Outcomes  Short Term: Able to use Dyspnea scale daily in rehab to express subjective sense of shortness of breath during exertion;Long Term: Able to use Dyspnea scale to guide intensity level when exercising independently       Knowledge and understanding of Target Heart Rate Range (THRR)  Yes       Intervention  Provide education and explanation of THRR including how the numbers were predicted and where they are located for reference       Expected Outcomes  Short Term: Able to state/look up THRR;Long Term: Able to use THRR to govern intensity when exercising independently;Short Term: Able to use daily as guideline for intensity in rehab       Able to check pulse independently  Yes       Intervention  Provide education and demonstration on how to check pulse in carotid and radial arteries.;Review the importance of being able to check your own pulse  for safety during independent exercise       Expected Outcomes  Short Term: Able to explain why pulse checking is important during independent exercise;Long Term: Able to check pulse independently and accurately       Understanding of Exercise Prescription  Yes       Intervention  Provide education, explanation, and written materials on patient's individual exercise prescription       Expected Outcomes  Short Term: Able to explain program exercise prescription;Long Term: Able to explain home exercise prescription to exercise independently          Exercise Goals Re-Evaluation :    Discharge Exercise Prescription (Final Exercise Prescription Changes):   Nutrition:  Target Goals: Understanding of nutrition guidelines, daily intake of sodium 1500mg , cholesterol 200mg , calories 30% from fat and 7% or less from saturated fats, daily to have 5 or more servings of fruits and vegetables.  Biometrics: Pre Biometrics - 12/26/17 1010      Pre Biometrics   Height  5\' 10"  (1.778 m)    Weight  167 lb 4.8 oz (75.9 kg)    Waist Circumference  38 inches    Hip Circumference  36 inches    Waist to Hip Ratio  1.06 %    BMI (Calculated)  24.01    Triceps Skinfold  12 mm    % Body Fat  25 %    Grip Strength  61.6 kg    Flexibility  0 in    Single Leg Stand  0 seconds        Nutrition Therapy Plan and Nutrition Goals: Nutrition Therapy & Goals - 12/26/17 1114      Personal Nutrition Goals   Nutrition Goal  He states he does not eat very much. He is eating heart healthy but at times he eats what he wants.     Additional Goals?  No       Nutrition Assessments: Nutrition Assessments - 12/26/17 1115      MEDFICTS Scores   Pre Score  36       Nutrition Goals Re-Evaluation:   Nutrition Goals Discharge (Final Nutrition Goals Re-Evaluation):   Psychosocial: Target Goals: Acknowledge presence or absence of significant depression and/or stress, maximize coping skills, provide positive  support system. Participant is able to verbalize types and ability to use techniques and skills needed for reducing stress and depression.  Initial Review & Psychosocial Screening: Initial Psych Review & Screening - 12/26/17 1117      Initial Review   Current issues with  None Identified Other than being sad about his wife being in nursing home, he has no depression.       Family Dynamics   Good Support System?  Yes      Barriers   Psychosocial barriers to participate in program  There are no identifiable barriers or psychosocial needs. Shared QOL scores with patient.       Screening Interventions   Interventions  Encouraged to exercise;Provide feedback about the scores to participant    Expected Outcomes  Short Term goal: Identification and review with participant of any Quality of Life or Depression concerns found by scoring the questionnaire.;Long Term goal: The participant improves quality of Life and PHQ9 Scores as seen by post scores and/or verbalization of changes       Quality of Life Scores: Quality of Life - 12/26/17 0909      Quality of Life   Select  Quality of Life      Quality of Life Scores   Health/Function Pre  19.57 %    Socioeconomic Pre  21 %    Psych/Spiritual Pre  18.5 %    Family Pre  21.1 %    GLOBAL Pre  19.83 %      Scores of 19 and below usually indicate a poorer quality of life in these areas.  A difference of  2-3 points is a clinically meaningful difference.  A difference of 2-3 points in the total score of the Quality of Life Index has been associated with significant improvement in overall quality of life, self-image, physical symptoms, and general health in studies assessing change in quality of life.  PHQ-9: Recent Review Flowsheet Data    Depression screen [facility] 2/9 12/26/2017   Decreased Interest 0   Down, Depressed, Hopeless 1   PHQ - 2 Score 1   Altered sleeping 0   Tired, decreased energy 2   Change in appetite 0   Feeling bad or failure  about yourself  0   Trouble concentrating 0   Moving slowly or fidgety/restless 0   Suicidal thoughts 0   PHQ-9 Score 3   Difficult doing work/chores Not difficult at all     Interpretation of Total Score  Total Score Depression Severity:  1-4 = Minimal depression, 5-9 =  Mild depression, 10-14 = Moderate depression, 15-19 = Moderately severe depression, 20-27 = Severe depression   Psychosocial Evaluation and Intervention: Psychosocial Evaluation - 12/26/17 1119      Psychosocial Evaluation & Interventions   Interventions  Encouraged to exercise with the program and follow exercise prescription    Continue Psychosocial Services   No Follow up required       Psychosocial Re-Evaluation: Psychosocial Re-Evaluation    [place] Name 12/26/17 1119             Psychosocial Re-Evaluation   Current issues with  None Identified       Continue Psychosocial Services   Follow up required by staff Will check on lhow he is feeling emotionally.           Psychosocial Discharge (Final Psychosocial Re-Evaluation): Psychosocial Re-Evaluation - 12/26/17 1119      Psychosocial Re-Evaluation   Current issues with  None Identified    Continue Psychosocial Services   Follow up required by staff Will check on lhow he is feeling emotionally.        Vocational Rehabilitation: Provide vocational rehab assistance to qualifying candidates.   Vocational Rehab Evaluation & Intervention: Vocational Rehab - 12/26/17 1107      Initial Vocational Rehab Evaluation & Intervention   Assessment shows need for Vocational Rehabilitation  No       Education: Education Goals: Education classes will be provided on a weekly basis, covering required topics. Participant will state understanding/return demonstration of topics presented.  Learning Barriers/Preferences: Learning Barriers/Preferences - 12/26/17 1106      Learning Barriers/Preferences   Learning Barriers  Hearing    Learning Preferences  Skilled  Demonstration;Individual Instruction;Group Instruction       Education Topics: Hypertension, Hypertension Reduction -Define heart disease and high blood pressure. Discus how high blood pressure affects the body and ways to reduce high blood pressure.   Exercise and Your Heart -Discuss why it is important to exercise, the FITT principles of exercise, normal and abnormal responses to exercise, and how to exercise safely.   Angina -Discuss definition of angina, causes of angina, treatment of angina, and how to decrease risk of having angina.   Cardiac Medications -Review what the following cardiac medications are used for, how they affect the body, and side effects that may occur when taking the medications.  Medications include Aspirin, Beta blockers, calcium channel blockers, ACE Inhibitors, angiotensin receptor blockers, diuretics, digoxin, and antihyperlipidemics.   Congestive Heart Failure -Discuss the definition of CHF, how to live with CHF, the signs and symptoms of CHF, and how keep track of weight and sodium intake.   Heart Disease and Intimacy -Discus the effect sexual activity has on the heart, how changes occur during intimacy as we age, and safety during sexual activity.   Smoking Cessation / COPD -Discuss different methods to quit smoking, the health benefits of quitting smoking, and the definition of COPD.   Nutrition I: Fats -Discuss the types of cholesterol, what cholesterol does to the heart, and how cholesterol levels can be controlled.   Nutrition II: Labels -Discuss the different components of food labels and how to read food label   Heart Parts/Heart Disease and PAD -Discuss the anatomy of the heart, the pathway of blood circulation through the heart, and these are affected by heart disease.   Stress I: Signs and Symptoms -Discuss the causes of stress, how stress may lead to anxiety and depression, and ways to limit stress.   Stress II:  Relaxation -  Discuss different types of relaxation techniques to limit stress.   Warning Signs of Stroke / TIA -Discuss definition of a stroke, what the signs and symptoms are of a stroke, and how to identify when someone is having stroke.   Knowledge Questionnaire Score: Knowledge Questionnaire Score - 12/26/17 1107      Knowledge Questionnaire Score   Pre Score  19/24       Core Components/Risk Factors/Patient Goals at Admission: Personal Goals and Risk Factors at Admission - 12/26/17 1116      Core Components/Risk Factors/Patient Goals on Admission    Weight Management  Weight Maintenance    Personal Goal Other  Yes    Personal Goal  Get stronger and gain more strength in his legs.     Intervention  Attend CR 3 x week and supplement with at home exercise 2 x week.     Expected Outcomes  Achieve personal goals.       Core Components/Risk Factors/Patient Goals Review:    Core Components/Risk Factors/Patient Goals at Discharge (Final Review):    ITP Comments: ITP Comments    Row Name 12/26/17 1110           ITP Comments  Mr. Blew is a pleasant 84 year old man. He starts with [place] on Monday 12/31/17 at the 9:30 class. He has a little saddness because his wife of 18 years has been placed in a nursing facility due to her having Alzheimer's. He is eager to get started and is looking forward to the fellowship of the program along with and him getting stronger.           Comments: Patient arrived for 1st visit/orientation/education at 0800. Patient was referred to CR by Dr. Gwenlyn Found due to [place] (Z95.5) and STEMI (I21.11). During orientation advised patient on arrival and appointment times what to wear, what to do before, during and after exercise. Reviewed attendance and class policy. Talked about inclement weather and class consultation policy. Pt is scheduled to return Cardiac Rehab on 12/31/17 at 0930. Pt was advised to come to class 15 minutes before class starts. Patient  was also given instructions on meeting with the dietician and attending the Family Structure classes. Discussed RPE/Dpysnea scales. Discussed initial THR and how to find their radial and/or carotid pulse. Discussed the initial exercise prescription and how this effects their progress. Pt is eager to get started. Patient participated in warm up stretches followed by light weights and resistance bands. Patient was able to complete 6 minute walk test. Patient c/o leg weakness during test. Leg weakness subsided at 2 minute rest. Leg weakness was completely gone at the end of orientation. Patient was measured for the equipment. Discussed equipment safety with patient. Took patient pre-anthropometric measurements. Patient finished visit at 10:30.

## 2017-12-31 ENCOUNTER — Encounter (HOSPITAL_COMMUNITY)
Admission: RE | Admit: 2017-12-31 | Discharge: 2017-12-31 | Disposition: A | Discharge status: Home / Self Care | Payer: Medicare Other | Source: Ambulatory Visit | Attending: Cardiology | Admitting: Cardiology

## 2017-12-31 DIAGNOSIS — Z955 Presence of coronary angioplasty implant and graft: Secondary | ICD-10-CM

## 2017-12-31 DIAGNOSIS — I2111 ST elevation (STEMI) myocardial infarction involving right coronary artery: Secondary | ICD-10-CM

## 2017-12-31 NOTE — Progress Notes (Signed)
Daily Session Note  Patient Details  Name: Randy Fitzpatrick. MRN: [id number] Date of Birth: 1924-12-18 Referring Provider:     CARDIAC REHAB PHASE II ORIENTATION from 12/26/2017 in [place]  Referring Provider  Dr. Domenic Polite      Encounter Date: 12/31/2017  Check In: Session Check In - 12/31/17 0930      Check-In   Location  [facility]    Staff Present  Diane Angelina Pih, MS, EP, [facility], Exercise Physiologist;Gregory Luther Parody, BS, EP, Exercise Physiologist;Kincaid Tiger Wynetta Emery, RN, BSN    Supervising physician immediately available to respond to emergencies  See telemetry face sheet for immediately available MD    Medication changes reported      No    Fall or balance concerns reported     Yes    Comments  Patient states his legs are weak which makes him a little wobbly.     Warm-up and Cool-down  Performed as group-led Higher education careers adviser Performed  Yes    VAD Patient?  No    PAD/SET Patient?  No      Pain Assessment   Currently in Pain?  No/denies    Pain Score  0-No pain    Multiple Pain Sites  No       Capillary Blood Glucose: No results found for this or any previous visit (from the past 24 hour(s)).    Social History   Tobacco Use  Smoking Status Former Smoker  . Packs/day: 1.00  . Years: 40.00  . Pack years: 40.00  . Types: Cigarettes  . Last attempt to quit: 06/26/1982  . Years since quitting: 35.5  Smokeless Tobacco Never Used    Goals Met:  Independence with exercise equipment Exercise tolerated well No report of cardiac concerns or symptoms Strength training completed today  Goals Unmet:  Not Applicable  Comments: Check out 1030.   Dr. Kate Sable is Medical Director for [facility] Cardiac and Pulmonary Rehab.

## 2017-12-31 NOTE — Progress Notes (Signed)
Cardiac Individual Treatment Plan  Patient Details  Name: Randy Fitzpatrick. MRN: [id number] Date of Birth: September 28, 1924 Referring Provider:     CARDIAC REHAB PHASE II ORIENTATION from 12/26/2017 in [place]  Referring Provider  Dr. Domenic Polite      Initial Encounter Date:    CARDIAC REHAB PHASE II ORIENTATION from 12/26/2017 in [place]  Date  12/26/17      Visit Diagnosis: Status post coronary artery stent placement  ST elevation myocardial infarction involving right coronary artery (HCC)  Patient's Home Medications on Admission:  Current Outpatient Medications:  .  aspirin EC 81 MG EC tablet, Take 1 tablet (81 mg total) by mouth daily., Disp: , Rfl:  .  atorvastatin (LIPITOR) 80 MG tablet, Take 1 tablet (80 mg total) by mouth daily at 6 PM., Disp: 30 tablet, Rfl: 2 .  carvedilol (COREG) 3.125 MG tablet, Take 1 tablet (3.125 mg total) by mouth 2 (two) times daily with a meal., Disp: 60 tablet, Rfl: 2 .  clopidogrel (PLAVIX) 75 MG tablet, Take 1 tablet (75 mg total) by mouth daily., Disp: 90 tablet, Rfl: 3 .  finasteride (PROSCAR) 5 MG tablet, Take 5 mg by mouth daily., Disp: , Rfl:  .  levothyroxine (SYNTHROID, LEVOTHROID) 50 MCG tablet, Take 50 mcg by mouth daily. , Disp: , Rfl:  .  nitroGLYCERIN (NITROSTAT) 0.4 MG SL tablet, Place 1 tablet (0.4 mg total) under the tongue every 5 (five) minutes x 3 doses as needed for chest pain., Disp: 25 tablet, Rfl: 1 .  tamsulosin (FLOMAX) 0.4 MG CAPS capsule, Take 0.4 mg by mouth daily. , Disp: , Rfl:   Past Medical History: Past Medical History:  Diagnosis Date  . Acute ST elevation myocardial infarction (STEMI) of inferior wall (HCC) 11/03/2017   PCI/DESx3 to RCA after wire dissection. EF 45-50%  . BPH (benign prostatic hyperplasia)   . Coronary artery disease involving native coronary artery of native heart with unstable angina pectoris ([place]) 11/05/2017  . Diabetes ([place])   . Hyperlipidemia  with target LDL less than 70 11/05/2017  . Hypothyroid   . Left main coronary artery disease 11/05/2017  . Moderate mitral regurgitation by prior echocardiogram 11/05/2017    Tobacco Use: Social History   Tobacco Use  Smoking Status Former Smoker  . Packs/day: 1.00  . Years: 40.00  . Pack years: 40.00  . Types: Cigarettes  . Last attempt to quit: 06/26/1982  . Years since quitting: 35.5  Smokeless Tobacco Never Used    Labs: Recent Chemical engineer    Labs for ITP Cardiac and Pulmonary Rehab Latest Ref Rng & Units 11/03/2017 11/04/2017   Cholestrol 0 - 200 mg/dL - 224(H)   LDLCALC 0 - 99 mg/dL - 139(H)   HDL >40 mg/dL - 32(L)   Trlycerides <150 mg/dL - 263(H)   Hemoglobin A1c 4.8 - 5.6 % 6.2(H) -   TCO2 22 - 32 mmol/L 24 -      Capillary Blood Glucose: Lab Results  Component Value Date   GLUCAP 105 (H) 11/05/2017   GLUCAP 191 (H) 11/04/2017     Exercise Target Goals:    Exercise Program Goal: Individual exercise prescription set using results from initial 6 min walk test and THRR while considering  patient's activity barriers and safety.   Exercise Prescription Goal: Starting with aerobic activity 30 plus minutes a day, 3 days per week for initial exercise prescription. Provide home exercise prescription and guidelines that participant acknowledges  understanding prior to discharge.  Activity Barriers & Risk Stratification: Activity Barriers & Cardiac Risk Stratification - 12/26/17 1008      Activity Barriers & Cardiac Risk Stratification   Activity Barriers  Deconditioning;Balance Concerns    Cardiac Risk Stratification  High       6 Minute Walk: 6 Minute Walk    Row Name 12/26/17 1008         6 Minute Walk   Phase  Initial     Distance  800 feet     Distance % Change  0 %     Distance Feet Change  0 ft     Walk Time  6 minutes     # of Rest Breaks  0     MPH  1.51     METS  2.16     RPE  13     Perceived Dyspnea   9     VO2 Peak  4.31      Symptoms  No     Resting HR  55 bpm     Resting BP  130/50     Resting Oxygen Saturation   97 %     Exercise Oxygen Saturation  during 6 min walk  95 %     Max Ex. HR  88 bpm     Max Ex. BP  160/54     2 Minute Post BP  140/50        Oxygen Initial Assessment:   Oxygen Re-Evaluation:   Oxygen Discharge (Final Oxygen Re-Evaluation):   Initial Exercise Prescription: Initial Exercise Prescription - 12/26/17 1000      Date of Initial Exercise RX and Referring Provider   Date  12/26/17    Referring Provider  Dr. Domenic Polite      NuStep   Level  1    SPM  66    Minutes  15    METs  1.6      Arm Ergometer   Level  1    Watts  7    RPM  20    Minutes  20    METs  1.4      Prescription Details   Frequency (times per week)  3    Duration  Progress to 30 minutes of continuous aerobic without signs/symptoms of physical distress      Intensity   THRR 40-80% of Max Heartrate  [phone]    Ratings of Perceived Exertion  11-13    Perceived Dyspnea  0-4      Progression   Progression  Continue progressive overload as per policy without signs/symptoms or physical distress.      Resistance Training   Training Prescription  Yes    Weight  1    Reps  10-15       Perform Capillary Blood Glucose checks as needed.  Exercise Prescription Changes:   Exercise Comments:   Exercise Goals and Review:  Exercise Goals    Row Name 12/26/17 1010             Exercise Goals   Increase Physical Activity  Yes       Intervention  Provide advice, education, support and counseling about physical activity/exercise needs.;Develop an individualized exercise prescription for aerobic and resistive training based on initial evaluation findings, risk stratification, comorbidities and participant's personal goals.       Expected Outcomes  Short Term: Attend rehab on a regular basis to increase amount of physical activity.;Long Term: Exercising regularly  at least 3-5 days a week.;Long Term:  Add in home exercise to make exercise part of routine and to increase amount of physical activity.       Increase Strength and Stamina  Yes       Intervention  Develop an individualized exercise prescription for aerobic and resistive training based on initial evaluation findings, risk stratification, comorbidities and participant's personal goals.;Provide advice, education, support and counseling about physical activity/exercise needs.       Expected Outcomes  Short Term: Increase workloads from initial exercise prescription for resistance, speed, and METs.       Able to understand and use rate of perceived exertion (RPE) scale  Yes       Intervention  Provide education and explanation on how to use RPE scale       Expected Outcomes  Short Term: Able to use RPE daily in rehab to express subjective intensity level;Long Term:  Able to use RPE to guide intensity level when exercising independently       Able to understand and use Dyspnea scale  Yes       Intervention  Provide education and explanation on how to use Dyspnea scale       Expected Outcomes  Short Term: Able to use Dyspnea scale daily in rehab to express subjective sense of shortness of breath during exertion;Long Term: Able to use Dyspnea scale to guide intensity level when exercising independently       Knowledge and understanding of Target Heart Rate Range (THRR)  Yes       Intervention  Provide education and explanation of THRR including how the numbers were predicted and where they are located for reference       Expected Outcomes  Short Term: Able to state/look up THRR;Long Term: Able to use THRR to govern intensity when exercising independently;Short Term: Able to use daily as guideline for intensity in rehab       Able to check pulse independently  Yes       Intervention  Provide education and demonstration on how to check pulse in carotid and radial arteries.;Review the importance of being able to check your own pulse for safety  during independent exercise       Expected Outcomes  Short Term: Able to explain why pulse checking is important during independent exercise;Long Term: Able to check pulse independently and accurately       Understanding of Exercise Prescription  Yes       Intervention  Provide education, explanation, and written materials on patient's individual exercise prescription       Expected Outcomes  Short Term: Able to explain program exercise prescription;Long Term: Able to explain home exercise prescription to exercise independently          Exercise Goals Re-Evaluation :    Discharge Exercise Prescription (Final Exercise Prescription Changes):   Nutrition:  Target Goals: Understanding of nutrition guidelines, daily intake of sodium 1500mg , cholesterol 200mg , calories 30% from fat and 7% or less from saturated fats, daily to have 5 or more servings of fruits and vegetables.  Biometrics: Pre Biometrics - 12/26/17 1010      Pre Biometrics   Height  5\' 10"  (1.778 m)    Weight  167 lb 4.8 oz (75.9 kg)    Waist Circumference  38 inches    Hip Circumference  36 inches    Waist to Hip Ratio  1.06 %    BMI (Calculated)  24.01    Triceps Skinfold  12 mm    % Body Fat  25 %    Grip Strength  61.6 kg    Flexibility  0 in    Single Leg Stand  0 seconds        Nutrition Therapy Plan and Nutrition Goals: Nutrition Therapy & Goals - 12/26/17 1114      Personal Nutrition Goals   Nutrition Goal  He states he does not eat very much. He is eating heart healthy but at times he eats what he wants.     Additional Goals?  No       Nutrition Assessments: Nutrition Assessments - 12/26/17 1115      MEDFICTS Scores   Pre Score  36       Nutrition Goals Re-Evaluation:   Nutrition Goals Discharge (Final Nutrition Goals Re-Evaluation):   Psychosocial: Target Goals: Acknowledge presence or absence of significant depression and/or stress, maximize coping skills, provide positive support  system. Participant is able to verbalize types and ability to use techniques and skills needed for reducing stress and depression.  Initial Review & Psychosocial Screening: Initial Psych Review & Screening - 12/26/17 1117      Initial Review   Current issues with  None Identified Other than being sad about his wife being in nursing home, he has no depression.       Family Dynamics   Good Support System?  Yes      Barriers   Psychosocial barriers to participate in program  There are no identifiable barriers or psychosocial needs. Shared QOL scores with patient.       Screening Interventions   Interventions  Encouraged to exercise;Provide feedback about the scores to participant    Expected Outcomes  Short Term goal: Identification and review with participant of any Quality of Life or Depression concerns found by scoring the questionnaire.;Long Term goal: The participant improves quality of Life and PHQ9 Scores as seen by post scores and/or verbalization of changes       Quality of Life Scores: Quality of Life - 12/26/17 0909      Quality of Life   Select  Quality of Life      Quality of Life Scores   Health/Function Pre  19.57 %    Socioeconomic Pre  21 %    Psych/Spiritual Pre  18.5 %    Family Pre  21.1 %    GLOBAL Pre  19.83 %      Scores of 19 and below usually indicate a poorer quality of life in these areas.  A difference of  2-3 points is a clinically meaningful difference.  A difference of 2-3 points in the total score of the Quality of Life Index has been associated with significant improvement in overall quality of life, self-image, physical symptoms, and general health in studies assessing change in quality of life.  PHQ-9: Recent Review Flowsheet Data    Depression screen [facility] 2/9 12/26/2017   Decreased Interest 0   Down, Depressed, Hopeless 1   PHQ - 2 Score 1   Altered sleeping 0   Tired, decreased energy 2   Change in appetite 0   Feeling bad or failure about  yourself  0   Trouble concentrating 0   Moving slowly or fidgety/restless 0   Suicidal thoughts 0   PHQ-9 Score 3   Difficult doing work/chores Not difficult at all     Interpretation of Total Score  Total Score Depression Severity:  1-4 = Minimal depression, 5-9 =  Mild depression, 10-14 = Moderate depression, 15-19 = Moderately severe depression, 20-27 = Severe depression   Psychosocial Evaluation and Intervention: Psychosocial Evaluation - 12/26/17 1119      Psychosocial Evaluation & Interventions   Interventions  Encouraged to exercise with the program and follow exercise prescription    Continue Psychosocial Services   No Follow up required       Psychosocial Re-Evaluation: Psychosocial Re-Evaluation    [place] Name 12/26/17 1119             Psychosocial Re-Evaluation   Current issues with  None Identified       Continue Psychosocial Services   Follow up required by staff Will check on lhow he is feeling emotionally.           Psychosocial Discharge (Final Psychosocial Re-Evaluation): Psychosocial Re-Evaluation - 12/26/17 1119      Psychosocial Re-Evaluation   Current issues with  None Identified    Continue Psychosocial Services   Follow up required by staff Will check on lhow he is feeling emotionally.        Vocational Rehabilitation: Provide vocational rehab assistance to qualifying candidates.   Vocational Rehab Evaluation & Intervention: Vocational Rehab - 12/26/17 1107      Initial Vocational Rehab Evaluation & Intervention   Assessment shows need for Vocational Rehabilitation  No       Education: Education Goals: Education classes will be provided on a weekly basis, covering required topics. Participant will state understanding/return demonstration of topics presented.  Learning Barriers/Preferences: Learning Barriers/Preferences - 12/26/17 1106      Learning Barriers/Preferences   Learning Barriers  Hearing    Learning Preferences  Skilled  Demonstration;Individual Instruction;Group Instruction       Education Topics: Hypertension, Hypertension Reduction -Define heart disease and high blood pressure. Discus how high blood pressure affects the body and ways to reduce high blood pressure.   Exercise and Your Heart -Discuss why it is important to exercise, the FITT principles of exercise, normal and abnormal responses to exercise, and how to exercise safely.   Angina -Discuss definition of angina, causes of angina, treatment of angina, and how to decrease risk of having angina.   Cardiac Medications -Review what the following cardiac medications are used for, how they affect the body, and side effects that may occur when taking the medications.  Medications include Aspirin, Beta blockers, calcium channel blockers, ACE Inhibitors, angiotensin receptor blockers, diuretics, digoxin, and antihyperlipidemics.   Congestive Heart Failure -Discuss the definition of CHF, how to live with CHF, the signs and symptoms of CHF, and how keep track of weight and sodium intake.   Heart Disease and Intimacy -Discus the effect sexual activity has on the heart, how changes occur during intimacy as we age, and safety during sexual activity.   Smoking Cessation / COPD -Discuss different methods to quit smoking, the health benefits of quitting smoking, and the definition of COPD.   Nutrition I: Fats -Discuss the types of cholesterol, what cholesterol does to the heart, and how cholesterol levels can be controlled.   Nutrition II: Labels -Discuss the different components of food labels and how to read food label   Heart Parts/Heart Disease and PAD -Discuss the anatomy of the heart, the pathway of blood circulation through the heart, and these are affected by heart disease.   Stress I: Signs and Symptoms -Discuss the causes of stress, how stress may lead to anxiety and depression, and ways to limit stress.   Stress II:  Relaxation -  Discuss different types of relaxation techniques to limit stress.   Warning Signs of Stroke / TIA -Discuss definition of a stroke, what the signs and symptoms are of a stroke, and how to identify when someone is having stroke.   Knowledge Questionnaire Score: Knowledge Questionnaire Score - 12/26/17 1107      Knowledge Questionnaire Score   Pre Score  19/24       Core Components/Risk Factors/Patient Goals at Admission: Personal Goals and Risk Factors at Admission - 12/26/17 1116      Core Components/Risk Factors/Patient Goals on Admission    Weight Management  Weight Maintenance    Personal Goal Other  Yes    Personal Goal  Get stronger and gain more strength in his legs.     Intervention  Attend CR 3 x week and supplement with at home exercise 2 x week.     Expected Outcomes  Achieve personal goals.       Core Components/Risk Factors/Patient Goals Review:    Core Components/Risk Factors/Patient Goals at Discharge (Final Review):    ITP Comments: ITP Comments    Row Name 12/26/17 1110 12/31/17 1334         ITP Comments  Mr. Jarrard is a pleasant 84 year old man. He starts with [place] on Monday 12/31/17 at the 9:30 class. He has a little saddness because his wife of 63 years has been placed in a nursing facility due to her having Alzheimer's. He is eager to get started and is looking forward to the fellowship of the program along with and him getting stronger.   Patient new to program. He has completed 2 sessions. Will continue to monitor for progress.          Comments: Patient new to program. He has completed 2 sessions. Will continue to monitor for progress.

## 2018-01-02 ENCOUNTER — Encounter (HOSPITAL_COMMUNITY)
Admission: RE | Admit: 2018-01-02 | Discharge: 2018-01-02 | Disposition: A | Discharge status: Home / Self Care | Payer: Medicare Other | Source: Ambulatory Visit | Attending: Cardiology | Admitting: Cardiology

## 2018-01-02 DIAGNOSIS — Z955 Presence of coronary angioplasty implant and graft: Secondary | ICD-10-CM | POA: Diagnosis not present

## 2018-01-02 DIAGNOSIS — I2111 ST elevation (STEMI) myocardial infarction involving right coronary artery: Secondary | ICD-10-CM

## 2018-01-02 NOTE — Progress Notes (Signed)
Daily Session Note  Patient Details  Name: Randy Fitzpatrick. MRN: [id number] Date of Birth: Nov 22, 1924 Referring Provider:     CARDIAC REHAB PHASE II ORIENTATION from 12/26/2017 in [place]  Referring Provider  Dr. Domenic Polite      Encounter Date: 01/02/2018  Check In: Session Check In - 01/02/18 0930      Check-In   Location  [facility]    Staff Present  Diane Angelina Pih, MS, EP, [facility], Exercise Physiologist;Gregory Luther Parody, BS, EP, Exercise Physiologist;Tonyetta Berko Wynetta Emery, RN, BSN    Supervising physician immediately available to respond to emergencies  See telemetry face sheet for immediately available MD    Medication changes reported      No    Fall or balance concerns reported     Yes    Comments  Patient states his legs are weak which makes him a little wobbly.     Warm-up and Cool-down  Performed as group-led Higher education careers adviser Performed  Yes    VAD Patient?  No    PAD/SET Patient?  No      Pain Assessment   Currently in Pain?  No/denies    Pain Score  0-No pain    Multiple Pain Sites  No       Capillary Blood Glucose: No results found for this or any previous visit (from the past 24 hour(s)).    Social History   Tobacco Use  Smoking Status Former Smoker  . Packs/day: 1.00  . Years: 40.00  . Pack years: 40.00  . Types: Cigarettes  . Last attempt to quit: 06/26/1982  . Years since quitting: 35.5  Smokeless Tobacco Never Used    Goals Met:  Independence with exercise equipment Exercise tolerated well No report of cardiac concerns or symptoms Strength training completed today  Goals Unmet:  Not Applicable  Comments: Check out 1030.   Dr. Kate Sable is Medical Director for [facility] Cardiac and Pulmonary Rehab.

## 2018-01-04 ENCOUNTER — Encounter (HOSPITAL_COMMUNITY)
Admission: RE | Admit: 2018-01-04 | Discharge: 2018-01-04 | Disposition: A | Discharge status: Home / Self Care | Payer: Medicare Other | Source: Ambulatory Visit | Attending: Cardiology | Admitting: Cardiology

## 2018-01-04 DIAGNOSIS — Z955 Presence of coronary angioplasty implant and graft: Secondary | ICD-10-CM

## 2018-01-04 DIAGNOSIS — I2111 ST elevation (STEMI) myocardial infarction involving right coronary artery: Secondary | ICD-10-CM

## 2018-01-04 NOTE — Progress Notes (Signed)
Daily Session Note  Patient Details  Name: Randy Fitzpatrick. MRN: [id number] Date of Birth: 06-Jul-1924 Referring Provider:     CARDIAC REHAB PHASE II ORIENTATION from 12/26/2017 in [place]  Referring Provider  Dr. Domenic Polite      Encounter Date: 01/04/2018  Check In: Session Check In - 01/04/18 0815      Check-In   Location  [facility]    Staff Present  Russella Dar, MS, EP, [facility], Exercise Physiologist;Norabelle Kondo Wynetta Emery, RN, BSN    Supervising physician immediately available to respond to emergencies  See telemetry face sheet for immediately available MD    Medication changes reported      No    Fall or balance concerns reported     Yes    Comments  Patient states his legs are weak which makes him a little wobbly.     Warm-up and Cool-down  Performed as group-led Higher education careers adviser Performed  Yes    VAD Patient?  No    PAD/SET Patient?  No      Pain Assessment   Currently in Pain?  No/denies    Pain Score  0-No pain    Multiple Pain Sites  No       Capillary Blood Glucose: No results found for this or any previous visit (from the past 24 hour(s)).    Social History   Tobacco Use  Smoking Status Former Smoker  . Packs/day: 1.00  . Years: 40.00  . Pack years: 40.00  . Types: Cigarettes  . Last attempt to quit: 06/26/1982  . Years since quitting: 35.5  Smokeless Tobacco Never Used    Goals Met:  Independence with exercise equipment Exercise tolerated well No report of cardiac concerns or symptoms Strength training completed today  Goals Unmet:  Not Applicable  Comments: Check out 915.   Dr. Kate Sable is Medical Director for [facility] Cardiac and Pulmonary Rehab.

## 2018-01-05 ENCOUNTER — Other Ambulatory Visit: Payer: Self-pay | Admitting: Cardiology

## 2018-01-07 ENCOUNTER — Encounter (HOSPITAL_COMMUNITY)
Admission: RE | Admit: 2018-01-07 | Discharge: 2018-01-07 | Disposition: A | Discharge status: Home / Self Care | Payer: Medicare Other | Source: Ambulatory Visit | Attending: Cardiology | Admitting: Cardiology

## 2018-01-07 DIAGNOSIS — I2111 ST elevation (STEMI) myocardial infarction involving right coronary artery: Secondary | ICD-10-CM

## 2018-01-07 DIAGNOSIS — Z955 Presence of coronary angioplasty implant and graft: Secondary | ICD-10-CM | POA: Diagnosis not present

## 2018-01-07 NOTE — Progress Notes (Signed)
Daily Session Note  Patient Details  Name: Randy Fitzpatrick. MRN: [id number] Date of Birth: 03-Oct-1924 Referring Provider:     CARDIAC REHAB PHASE II ORIENTATION from 12/26/2017 in [place]  Referring Provider  Dr. Domenic Polite      Encounter Date: 01/07/2018  Check In: Session Check In - 01/07/18 0930      Check-In   Location  [facility]    Staff Present  Russella Dar, MS, EP, [facility], Exercise Physiologist;Deslyn Cavenaugh Wynetta Emery, RN, BSN    Supervising physician immediately available to respond to emergencies  See telemetry face sheet for immediately available MD    Medication changes reported      No    Fall or balance concerns reported     Yes    Comments  Patient states his legs are weak which makes him a little wobbly.     Warm-up and Cool-down  Performed as group-led Higher education careers adviser Performed  Yes    VAD Patient?  No      Pain Assessment   Currently in Pain?  No/denies    Pain Score  0-No pain    Multiple Pain Sites  No       Capillary Blood Glucose: No results found for this or any previous visit (from the past 24 hour(s)).    Social History   Tobacco Use  Smoking Status Former Smoker  . Packs/day: 1.00  . Years: 40.00  . Pack years: 40.00  . Types: Cigarettes  . Last attempt to quit: 06/26/1982  . Years since quitting: 35.5  Smokeless Tobacco Never Used    Goals Met:  Independence with exercise equipment Exercise tolerated well No report of cardiac concerns or symptoms Strength training completed today  Goals Unmet:  Not Applicable  Comments: Check out 1030.   Dr. Kate Sable is Medical Director for [facility] Cardiac and Pulmonary Rehab.

## 2018-01-09 ENCOUNTER — Encounter (HOSPITAL_COMMUNITY)
Admission: RE | Admit: 2018-01-09 | Discharge: 2018-01-09 | Disposition: A | Discharge status: Home / Self Care | Payer: Medicare Other | Source: Ambulatory Visit | Attending: Cardiology | Admitting: Cardiology

## 2018-01-09 DIAGNOSIS — Z955 Presence of coronary angioplasty implant and graft: Secondary | ICD-10-CM | POA: Diagnosis not present

## 2018-01-09 NOTE — Progress Notes (Signed)
Daily Session Note  Patient Details  Name: Alvie Fowles. MRN: [id number] Date of Birth: Jan 13, 1925 Referring Provider:     CARDIAC REHAB PHASE II ORIENTATION from 12/26/2017 in [place]  Referring Provider  Dr. Domenic Polite      Encounter Date: 01/09/2018  Check In: Session Check In - 01/09/18 0815      Check-In   Location  [facility]    Staff Present  Russella Dar, MS, EP, [facility], Exercise Physiologist;Debra Wynetta Emery, RN, BSN    Supervising physician immediately available to respond to emergencies  See telemetry face sheet for immediately available MD    Medication changes reported      No    Fall or balance concerns reported     Yes    Tobacco Cessation  No Change    Warm-up and Cool-down  Performed as group-led instruction    Resistance Training Performed  Yes    VAD Patient?  No    PAD/SET Patient?  No      Pain Assessment   Currently in Pain?  No/denies    Pain Score  0-No pain    Multiple Pain Sites  No       Capillary Blood Glucose: No results found for this or any previous visit (from the past 24 hour(s)).    Social History   Tobacco Use  Smoking Status Former Smoker  . Packs/day: 1.00  . Years: 40.00  . Pack years: 40.00  . Types: Cigarettes  . Last attempt to quit: 06/26/1982  . Years since quitting: 35.5  Smokeless Tobacco Never Used    Goals Met:  Independence with exercise equipment Exercise tolerated well Personal goals reviewed No report of cardiac concerns or symptoms Strength training completed today  Goals Unmet:  Not Applicable  Comments: Check out: 0915   Dr. Kate Sable is Medical Director for [place] and Pulmonary Rehab.

## 2018-01-11 ENCOUNTER — Encounter (HOSPITAL_COMMUNITY)
Admission: RE | Admit: 2018-01-11 | Discharge: 2018-01-11 | Disposition: A | Discharge status: Home / Self Care | Payer: Medicare Other | Source: Ambulatory Visit | Attending: Cardiology | Admitting: Cardiology

## 2018-01-11 DIAGNOSIS — Z955 Presence of coronary angioplasty implant and graft: Secondary | ICD-10-CM

## 2018-01-11 DIAGNOSIS — I2111 ST elevation (STEMI) myocardial infarction involving right coronary artery: Secondary | ICD-10-CM

## 2018-01-11 NOTE — Progress Notes (Signed)
Daily Session Note  Patient Details  Name: Randy Fitzpatrick. MRN: [id number] Date of Birth: [birth year] Referring Provider:     CARDIAC REHAB PHASE II ORIENTATION from 12/26/2017 in [place]  Referring Provider  Dr. Domenic Polite      Encounter Date: 01/11/2018  Check In: Session Check In - 01/11/18 0930      Check-In   Location  [facility]    Staff Present  Russella Dar, MS, EP, [facility], Exercise Physiologist;Debra Wynetta Emery, RN, BSN    Supervising physician immediately available to respond to emergencies  See telemetry face sheet for immediately available MD    Medication changes reported      No    Fall or balance concerns reported     Yes    Comments  Patient states his legs are weak which makes him a little wobbly.     Tobacco Cessation  No Change    Warm-up and Cool-down  Performed as group-led instruction    Resistance Training Performed  Yes    VAD Patient?  No    PAD/SET Patient?  No      Pain Assessment   Currently in Pain?  No/denies    Pain Score  0-No pain    Multiple Pain Sites  No       Capillary Blood Glucose: No results found for this or any previous visit (from the past 24 hour(s)).    Social History   Tobacco Use  Smoking Status Former Smoker  . Packs/day: 1.00  . Years: 40.00  . Pack years: 40.00  . Types: Cigarettes  . Last attempt to quit: 06/26/1982  . Years since quitting: 35.5  Smokeless Tobacco Never Used    Goals Met:  Independence with exercise equipment Exercise tolerated well Personal goals reviewed No report of cardiac concerns or symptoms Strength training completed today  Goals Unmet:  Not Applicable  Comments: Check out: 1030   Dr. Kate Sable is Medical Director for [facility].

## 2018-01-14 ENCOUNTER — Encounter (HOSPITAL_COMMUNITY): Payer: Medicare Other

## 2018-01-16 ENCOUNTER — Encounter (HOSPITAL_COMMUNITY): Payer: Medicare Other

## 2018-01-18 ENCOUNTER — Encounter (HOSPITAL_COMMUNITY): Payer: Medicare Other

## 2018-01-21 ENCOUNTER — Encounter (HOSPITAL_COMMUNITY): Payer: Medicare Other

## 2018-01-22 ENCOUNTER — Encounter: Payer: Self-pay | Admitting: Cardiology

## 2018-01-22 NOTE — Progress Notes (Signed)
Cardiology Office Note  Date: 01/23/2018   ID: Randy Bunting., DOB [birth year], MRN [id number]  PCP: Ranae Palms, MD  Evaluating Cardiologist: [name], MD   Chief Complaint  Patient presents with  . Coronary Artery Disease    History of Present Illness: Randy Garcon. is a 84 y.o. male that I am seeing today in clinic for the first time.  He was last seen by Ms. Strader PA-C in May.  I reviewed his records and updated the chart.  He is here today with his daughter.  From a cardiac perspective he does not report any angina symptoms or nitroglycerin use.  In reviewing the chart I see that he was hospitalized at [facility] in June having presented with hematemesis and severe anemia with hemoglobin of 6.6.  He was transported to [facility] where he underwent endoscopy and was found to have a duodenal ulcer.  He required packed red cell transfusions, was ultimately continued on aspirin and Plavix in light of recent DES intervention in May.  He states that he has had follow-up with Dr. Hampton Abbot and received an additional outpatient packed red cell transfusion recently.  He states that his stools are dark but not "black" as they were when he had his acute bleeding event.  He is currently not on iron supplements.  Review his cardiac regimen which at this point includes aspirin, Plavix, Coreg, Lipitor, and as needed nitroglycerin.  He is on high-dose Protonix since recent GI bleed.  He does not report any obvious intolerances to Lipitor.  They we discussed his cardiac history and the interval events including subsequent GI bleed.  Since DES intervention was in May, our plan is to continue aspirin and Plavix if at all possible.  I asked him to make sure that he takes these medications with food in his stomach.  He is to follow-up with his PCP in the next few weeks for follow-up hemoglobin.  I question whether he might further benefit from iron supplements.  He had started  cardiac rehabilitation just prior to his GI bleed, I have recommended to him that he not return to the program at this point, particularly until his hemoglobin stabilizes.  His wife of over 67 years is now in an Alzheimer's unit.  This is been very stressful for him as well.  Past Medical History:  Diagnosis Date  . Acute ST elevation myocardial infarction (STEMI) of inferior wall (HCC) 11/03/2017   PCI/DESx3 to RCA after wire dissection. EF 45-50%  . BPH (benign prostatic hyperplasia)   . CAD (coronary artery disease)    Multivessel/left main disease  . Hypothyroidism   . Mitral regurgitation   . Mixed hyperlipidemia   . Type 2 diabetes mellitus ([place])     Past Surgical History:  Procedure Laterality Date  . CORONARY STENT INTERVENTION N/A 11/03/2017   Procedure: CORONARY STENT INTERVENTION;  Surgeon: Lorretta Harp, MD;  Location: [place] [facility];  Service: Cardiovascular;  Laterality: N/A;  . CORONARY/GRAFT ACUTE MI REVASCULARIZATION N/A 11/03/2017   Procedure: Coronary/Graft Acute MI Revascularization;  Surgeon: Lorretta Harp, MD;  Location: [place] [facility];  Service: Cardiovascular;  Laterality: N/A;  . LEFT HEART CATH AND CORONARY ANGIOGRAPHY N/A 11/03/2017   Procedure: LEFT HEART CATH AND CORONARY ANGIOGRAPHY;  Surgeon: Lorretta Harp, MD;  Location: [place] [facility];  Service: Cardiovascular;  Laterality: N/A;  . STOMACH SURGERY     Treatment of an abscess  Current Outpatient Medications  Medication Sig Dispense Refill  . aspirin EC 81 MG EC tablet Take 1 tablet (81 mg total) by mouth daily.    Marland Kitchen atorvastatin (LIPITOR) 80 MG tablet TAKE 1 TABLET BY MOUTH DAILY AT 6 PM. 30 tablet 0  . carvedilol (COREG) 3.125 MG tablet TAKE 1 TABLET BY MOUTH TWICE DAILY WITH FOOD. 60 tablet 0  . clopidogrel (PLAVIX) 75 MG tablet Take 1 tablet (75 mg total) by mouth daily. 90 tablet 3  . finasteride (PROSCAR) 5 MG tablet Take 5 mg by mouth daily.    Marland Kitchen levothyroxine  (SYNTHROID, LEVOTHROID) 50 MCG tablet Take 50 mcg by mouth daily.     . nitroGLYCERIN (NITROSTAT) 0.4 MG SL tablet Place 1 tablet (0.4 mg total) under the tongue every 5 (five) minutes x 3 doses as needed for chest pain. 25 tablet 1  . pantoprazole (PROTONIX) 40 MG tablet Take 40 mg by mouth 2 (two) times daily.    . tamsulosin (FLOMAX) 0.4 MG CAPS capsule Take 0.4 mg by mouth daily.      No current facility-administered medications for this visit.    Allergies:  Patient has no known allergies.   Social History: The patient  reports that he quit smoking about 35 years ago. His smoking use included cigarettes. He has a 40.00 pack-year smoking history. He has never used smokeless tobacco. He reports that he does not drink alcohol or use drugs.   Family History: The patient's family history includes Heart failure (age of onset: 63) in his father; Parkinsonism (age of onset: 5) in his mother.   ROS:  Please see the history of present illness. Otherwise, complete review of systems is positive for hearing loss.  All other systems are reviewed and negative.   Physical Exam: VS:  BP (!) 148/52   Pulse 60   Ht 5\' 10"  (1.778 m)   Wt 172 lb (78 kg)   SpO2 98%   BMI 24.68 kg/m , BMI Body mass index is 24.68 kg/m.  Wt Readings from Last 3 Encounters:  01/23/18 172 lb (78 kg)  12/26/17 167 lb 4.8 oz (75.9 kg)  11/23/17 171 lb (77.6 kg)    General: Elderly male, appears comfortable at rest. HEENT: Conjunctiva and lids normal, oropharynx clear. Neck: Supple, no elevated JVP or carotid bruits, no thyromegaly. Lungs: Clear to auscultation, nonlabored breathing at rest. Cardiac: Regular rate and rhythm, no S3, 2/6 systolic murmur, no pericardial rub. Abdomen: Soft, nontender, bowel sounds present, no guarding or rebound. Extremities: No pitting edema, distal pulses 2+. Skin: Warm and dry.  Somewhat pale. Musculoskeletal: No kyphosis. Neuropsychiatric: Alert and oriented x3, affect grossly  appropriate.  ECG: I personally reviewed the tracing from 11/23/2017 which showed sinus rhythm with prolonged PR interval, right bundle branch block, old inferior infarct pattern.  Recent Labwork: 11/06/2017: ALT 28; AST 43 11/08/2017: Hemoglobin 9.7; Platelets 206 11/23/2017: BUN 29; Creatinine, Ser 1.65; Potassium 4.6; Sodium 137     Component Value Date/Time   CHOL 224 (H) 11/04/2017 0242   TRIG 263 (H) 11/04/2017 0242   HDL 32 (L) 11/04/2017 0242   CHOLHDL 7.0 11/04/2017 0242   VLDL 53 (H) 11/04/2017 0242   LDLCALC 139 (H) 11/04/2017 0242    Other Studies Reviewed Today:  Echocardiogram 11/04/2017: Study Conclusions  - Left ventricle: Inferior wall hypokinesis The cavity size was   mildly dilated. Wall thickness was increased in a pattern of   moderate LVH. Systolic function was mildly reduced. The estimated  ejection fraction was in the range of 45% to 50%. Doppler   parameters are consistent with both elevated ventricular   end-diastolic filling pressure and elevated left atrial filling   pressure. - Aortic valve: There was trivial regurgitation. - Mitral valve: Severely calcified annulus. There was moderate   regurgitation. - Left atrium: The atrium was mildly dilated. - Atrial septum: No defect or patent foramen ovale was identified. - Pericardium, extracardiac: A trivial pericardial effusion was   identified.  Cardiac catheterization and PCI 11/03/2017:  Mid RCA lesion is 80% stenosed.  Dist RCA lesion is 100% stenosed.  Ost LM to Dist LM lesion is 70% stenosed.  2nd Mrg lesion is 100% stenosed.  A stent was successfully placed.  Post intervention, there is a 0% residual stenosis.  Post intervention, there is a 0% residual stenosis.  IMPRESSION: Successful PCI and drug-eluting stenting of a dominant RCA in the setting of an inferior STEMI complicated by guide related dissection with 3 drug-eluting stents.  He had excellent flow at the end of the case which  was TIMI-3.  He did have a calcified 60 to 70% distal left main and occluded small marginal branch.  His LVEDP was 21.  A 2D echo will be performed.  His enzymes will be cycled.  He will need dual antiplatelet therapy for at least 12 months uninterrupted as well as beta-blockade and high-dose statin therapy.  Assessment and Plan:  1.  Multivessel/left main CAD status post inferior STEMI in May of this year with culprit vessel intervention including DES x3 to the RCA and otherwise medical therapy.  Plan is to continue aspirin and Plavix for now, realizing that he will remain at risk for recurrent GI bleed.  Fortunately, not reporting any active angina at this time.  2.  Ischemic cardiomyopathy with LVEF 45 to 50%.  Weight is relatively stable and he does not have any obvious fluid excess on examination.  Continue Coreg, hold off on addition of ARB at this point in light of recent comorbid illnesses.  Creatinine was 2.0 just recently at [facility].  3.  Recent GI bleed with severe anemia as outlined above.  Patient found to have a duodenal ulcer by endoscopy at [facility].  He has received a total of 3 packed red cell transfusions and is followed by Dr. Hampton Abbot.  Question whether he might benefit from additional iron supplementation.  4.  LDL 139 in May, currently on high-dose Lipitor.  Will consider follow-up lipid panel around his next visit.  5.  Hypothyroidism, on Synthroid.  Current medicines were reviewed with the patient today.  Disposition: Follow-up in 2 to 3 months.  Signed, Satira Sark, MD, [facility] 01/23/2018 2:27 PM    [facility], [place], [place] [address] Phone: [phone]; Fax: [phone]

## 2018-01-23 ENCOUNTER — Ambulatory Visit (INDEPENDENT_AMBULATORY_CARE_PROVIDER_SITE_OTHER): Payer: Medicare Other | Admitting: Cardiology

## 2018-01-23 ENCOUNTER — Encounter (HOSPITAL_COMMUNITY): Payer: Medicare Other

## 2018-01-23 ENCOUNTER — Encounter: Payer: Self-pay | Admitting: Cardiology

## 2018-01-23 VITALS — BP 148/52 | HR 60 | Ht 70.0 in | Wt 172.0 lb

## 2018-01-23 DIAGNOSIS — Z8719 Personal history of other diseases of the digestive system: Secondary | ICD-10-CM

## 2018-01-23 DIAGNOSIS — E039 Hypothyroidism, unspecified: Secondary | ICD-10-CM

## 2018-01-23 DIAGNOSIS — I255 Ischemic cardiomyopathy: Secondary | ICD-10-CM | POA: Diagnosis not present

## 2018-01-23 DIAGNOSIS — E782 Mixed hyperlipidemia: Secondary | ICD-10-CM | POA: Diagnosis not present

## 2018-01-23 DIAGNOSIS — I25119 Atherosclerotic heart disease of native coronary artery with unspecified angina pectoris: Secondary | ICD-10-CM | POA: Diagnosis not present

## 2018-01-23 NOTE — Patient Instructions (Signed)
Medication Instructions:  Your physician recommends that you continue on your current medications as directed. Please refer to the Current Medication list given to you today.  Labwork: NONE  Testing/Procedures: NONE  Follow-Up: Your physician recommends that you schedule a follow-up appointment in: 3 MONTHS WITH DR. MCDOWELL.  Any Other Special Instructions Will Be Listed Below (If Applicable).  If you need a refill on your cardiac medications before your next appointment, please call your pharmacy. 

## 2018-01-25 ENCOUNTER — Encounter (HOSPITAL_COMMUNITY): Payer: Medicare Other

## 2018-01-28 ENCOUNTER — Encounter (HOSPITAL_COMMUNITY): Payer: Medicare Other

## 2018-01-30 ENCOUNTER — Encounter (HOSPITAL_COMMUNITY): Payer: Medicare Other

## 2018-01-30 NOTE — Progress Notes (Signed)
Discharge Progress Report  Patient Details  Name: Randy Fitzpatrick. MRN: [id number] Date of Birth: 03-25-1925 Referring Provider:     CARDIAC REHAB PHASE II ORIENTATION from 12/26/2017 in [place]  Referring Provider  Dr. Domenic Polite       Number of Visits: 7  Reason for Discharge:  Early Exit:  Chronic illness.   Smoking History:  Social History   Tobacco Use  Smoking Status Former Smoker  . Packs/day: 1.00  . Years: 40.00  . Pack years: 40.00  . Types: Cigarettes  . Last attempt to quit: 06/26/1982  . Years since quitting: 35.6  Smokeless Tobacco Never Used    Diagnosis:  Status post coronary artery stent placement  ST elevation myocardial infarction involving right coronary artery ([place])  ADL [facility]:   Initial Exercise Prescription: Initial Exercise Prescription - 12/26/17 1000      Date of Initial Exercise RX and Referring Provider   Date  12/26/17    Referring Provider  Dr. Domenic Polite      NuStep   Level  1    SPM  66    Minutes  15    METs  1.6      Arm Ergometer   Level  1    Watts  7    RPM  20    Minutes  20    METs  1.4      Prescription Details   Frequency (times per week)  3    Duration  Progress to 30 minutes of continuous aerobic without signs/symptoms of physical distress      Intensity   THRR 40-80% of Max Heartrate  [phone]    Ratings of Perceived Exertion  11-13    Perceived Dyspnea  0-4      Progression   Progression  Continue progressive overload as per policy without signs/symptoms or physical distress.      Resistance Training   Training Prescription  Yes    Weight  1    Reps  10-15       Discharge Exercise Prescription (Final Exercise Prescription Changes): Exercise Prescription Changes - 01/23/18 0700      Response to Exercise   Blood Pressure (Admit)  112/62    Blood Pressure (Exercise)  154/60    Blood Pressure (Exit)  126/58    Heart Rate (Admit)  57 bpm    Heart Rate (Exercise)  80 bpm     Heart Rate (Exit)  65 bpm    Rating of Perceived Exertion (Exercise)  13    Symptoms  -- c/o being weak on last visit    Comments  -- Patient has been out due to being in hospital. No progress    Duration  Progress to 30 minutes of  aerobic without signs/symptoms of physical distress    Intensity  THRR unchanged      Progression   Progression  Continue to progress workloads to maintain intensity without signs/symptoms of physical distress.    Average METs  1.7      Resistance Training   Training Prescription  Yes    Weight  1    Reps  10-15      NuStep   Level  1    SPM  77    Minutes  15    METs  1.8      Arm Ergometer   Level  1.2    Watts  9    RPM  22    Minutes  20    METs  1.6      Home Exercise Plan   Plans to continue exercise at  Home (comment)    Frequency  Add 2 additional days to program exercise sessions.    Initial Home Exercises Provided  01/14/18       Functional Capacity: 6 Minute Walk    Row Name 12/26/17 1008         6 Minute Walk   Phase  Initial     Distance  800 feet     Distance % Change  0 %     Distance Feet Change  0 ft     Walk Time  6 minutes     # of Rest Breaks  0     MPH  1.51     METS  2.16     RPE  13     Perceived Dyspnea   9     VO2 Peak  4.31     Symptoms  No     Resting HR  55 bpm     Resting BP  130/50     Resting Oxygen Saturation   97 %     Exercise Oxygen Saturation  during 6 min walk  95 %     Max Ex. HR  88 bpm     Max Ex. BP  160/54     2 Minute Post BP  140/50        Psychological, QOL, Others - Outcomes: PHQ 2/9: Depression screen PHQ 2/9 12/26/2017  Decreased Interest 0  Down, Depressed, Hopeless 1  PHQ - 2 Score 1  Altered sleeping 0  Tired, decreased energy 2  Change in appetite 0  Feeling bad or failure about yourself  0  Trouble concentrating 0  Moving slowly or fidgety/restless 0  Suicidal thoughts 0  PHQ-9 Score 3  Difficult doing work/chores Not difficult at all    Quality of  Life: Quality of Life - 12/26/17 0909      Quality of Life   Select  Quality of Life      Quality of Life Scores   Health/Function Pre  19.57 %    Socioeconomic Pre  21 %    Psych/Spiritual Pre  18.5 %    Family Pre  21.1 %    GLOBAL Pre  19.83 %       Personal Goals: Goals established at orientation with interventions provided to work toward goal. Personal Goals and Risk Factors at Admission - 12/26/17 1116      Core Components/Risk Factors/Patient Goals on Admission    Weight Management  Weight Maintenance    Personal Goal Other  Yes    Personal Goal  Get stronger and gain more strength in his legs.     Intervention  Attend CR 3 x week and supplement with at home exercise 2 x week.     Expected Outcomes  Achieve personal goals.        Personal Goals Discharge:   Exercise Goals and Review: Exercise Goals    Row Name 12/26/17 1010             Exercise Goals   Increase Physical Activity  Yes       Intervention  Provide advice, education, support and counseling about physical activity/exercise needs.;Develop an individualized exercise prescription for aerobic and resistive training based on initial evaluation findings, risk stratification, comorbidities and participant's personal goals.       Expected Outcomes  Short  Term: Attend rehab on a regular basis to increase amount of physical activity.;Long Term: Exercising regularly at least 3-5 days a week.;Long Term: Add in home exercise to make exercise part of routine and to increase amount of physical activity.       Increase Strength and Stamina  Yes       Intervention  Develop an individualized exercise prescription for aerobic and resistive training based on initial evaluation findings, risk stratification, comorbidities and participant's personal goals.;Provide advice, education, support and counseling about physical activity/exercise needs.       Expected Outcomes  Short Term: Increase workloads from initial exercise  prescription for resistance, speed, and METs.       Able to understand and use rate of perceived exertion (RPE) scale  Yes       Intervention  Provide education and explanation on how to use RPE scale       Expected Outcomes  Short Term: Able to use RPE daily in rehab to express subjective intensity level;Long Term:  Able to use RPE to guide intensity level when exercising independently       Able to understand and use Dyspnea scale  Yes       Intervention  Provide education and explanation on how to use Dyspnea scale       Expected Outcomes  Short Term: Able to use Dyspnea scale daily in rehab to express subjective sense of shortness of breath during exertion;Long Term: Able to use Dyspnea scale to guide intensity level when exercising independently       Knowledge and understanding of Target Heart Rate Range (THRR)  Yes       Intervention  Provide education and explanation of THRR including how the numbers were predicted and where they are located for reference       Expected Outcomes  Short Term: Able to state/look up THRR;Long Term: Able to use THRR to govern intensity when exercising independently;Short Term: Able to use daily as guideline for intensity in rehab       Able to check pulse independently  Yes       Intervention  Provide education and demonstration on how to check pulse in carotid and radial arteries.;Review the importance of being able to check your own pulse for safety during independent exercise       Expected Outcomes  Short Term: Able to explain why pulse checking is important during independent exercise;Long Term: Able to check pulse independently and accurately       Understanding of Exercise Prescription  Yes       Intervention  Provide education, explanation, and written materials on patient's individual exercise prescription       Expected Outcomes  Short Term: Able to explain program exercise prescription;Long Term: Able to explain home exercise prescription to exercise  independently          Nutrition & Weight - Outcomes: Pre Biometrics - 12/26/17 1010      Pre Biometrics   Height  5\' 10"  (1.778 m)    Weight  167 lb 4.8 oz (75.9 kg)    Waist Circumference  38 inches    Hip Circumference  36 inches    Waist to Hip Ratio  1.06 %    BMI (Calculated)  24.01    Triceps Skinfold  12 mm    % Body Fat  25 %    Grip Strength  61.6 kg    Flexibility  0 in    Single Leg Stand  0 seconds        Nutrition: Nutrition Therapy & Goals - 01/03/18 1506      Personal Nutrition Goals   Nutrition Goal  For heart healthy choices add >50% of whole grains, make half their plate fruits and vegetables. Discuss the difference between starchy vegetables and leafy greens, and how leafy vegetables provide fiber, helps maintain healthy weight, helps control blood glucose, and lowers cholesterol.  Discuss purchasing fresh or frozen vegetable to reduce sodium and not to add grease, fat or sugar. Consume <18oz of red meat per week. Consume lean cuts of meats and very little of meats high in sodium and nitrates such as pork and lunch meats. Discussed portion control for all food groups.      Additional Goals?  No      Intervention Plan   Intervention  Nutrition handout(s) given to patient.    Expected Outcomes  Short Term Goal: Understand basic principles of dietary content, such as calories, fat, sodium, cholesterol and nutrients.       Nutrition Discharge: Nutrition Assessments - 12/26/17 1115      MEDFICTS Scores   Pre Score  36       Education Questionnaire Score: Knowledge Questionnaire Score - 12/26/17 1107      Knowledge Questionnaire Score   Pre Score  19/24

## 2018-01-30 NOTE — Progress Notes (Signed)
Cardiac Individual Treatment Plan  Patient Details  Name: Randy Fitzpatrick. MRN: [id number] Date of Birth: [birth year] Referring Provider:     CARDIAC REHAB PHASE II ORIENTATION from 12/26/2017 in [facility]  Referring Provider  Dr. Domenic Polite      Initial Encounter Date:    CARDIAC REHAB PHASE II ORIENTATION from 12/26/2017 in [place]  Date  12/26/17      Visit Diagnosis: Status post coronary artery stent placement  ST elevation myocardial infarction involving right coronary artery (HCC)  Patient's Home Medications on Admission:  Current Outpatient Medications:  .  aspirin EC 81 MG EC tablet, Take 1 tablet (81 mg total) by mouth daily., Disp: , Rfl:  .  atorvastatin (LIPITOR) 80 MG tablet, TAKE 1 TABLET BY MOUTH DAILY AT 6 PM., Disp: 30 tablet, Rfl: 0 .  carvedilol (COREG) 3.125 MG tablet, TAKE 1 TABLET BY MOUTH TWICE DAILY WITH FOOD., Disp: 60 tablet, Rfl: 0 .  clopidogrel (PLAVIX) 75 MG tablet, Take 1 tablet (75 mg total) by mouth daily., Disp: 90 tablet, Rfl: 3 .  finasteride (PROSCAR) 5 MG tablet, Take 5 mg by mouth daily., Disp: , Rfl:  .  levothyroxine (SYNTHROID, LEVOTHROID) 50 MCG tablet, Take 50 mcg by mouth daily. , Disp: , Rfl:  .  nitroGLYCERIN (NITROSTAT) 0.4 MG SL tablet, Place 1 tablet (0.4 mg total) under the tongue every 5 (five) minutes x 3 doses as needed for chest pain., Disp: 25 tablet, Rfl: 1 .  pantoprazole (PROTONIX) 40 MG tablet, Take 40 mg by mouth 2 (two) times daily., Disp: , Rfl:  .  tamsulosin (FLOMAX) 0.4 MG CAPS capsule, Take 0.4 mg by mouth daily. , Disp: , Rfl:   Past Medical History: Past Medical History:  Diagnosis Date  . Acute ST elevation myocardial infarction (STEMI) of inferior wall (HCC) 11/03/2017   PCI/DESx3 to RCA after wire dissection. EF 45-50%  . BPH (benign prostatic hyperplasia)   . CAD (coronary artery disease)    Multivessel/left main disease  . Hypothyroidism   . Mitral  regurgitation   . Mixed hyperlipidemia   . Type 2 diabetes mellitus (HCC)     Tobacco Use: Social History   Tobacco Use  Smoking Status Former Smoker  . Packs/day: 1.00  . Years: 40.00  . Pack years: 40.00  . Types: Cigarettes  . Last attempt to quit: 06/26/1982  . Years since quitting: 35.6  Smokeless Tobacco Never Used    Labs: Recent Chemical engineer    Labs for ITP Cardiac and Pulmonary Rehab Latest Ref Rng & Units 11/03/2017 11/04/2017   Cholestrol 0 - 200 mg/dL - 224(H)   LDLCALC 0 - 99 mg/dL - 139(H)   HDL >40 mg/dL - 32(L)   Trlycerides <150 mg/dL - 263(H)   Hemoglobin A1c 4.8 - 5.6 % 6.2(H) -   TCO2 22 - 32 mmol/L 24 -      Capillary Blood Glucose: Lab Results  Component Value Date   GLUCAP 105 (H) 11/05/2017   GLUCAP 191 (H) 11/04/2017     Exercise Target Goals:    Exercise Program Goal: Individual exercise prescription set using results from initial 6 min walk test and THRR while considering  patient's activity barriers and safety.   Exercise Prescription Goal: Starting with aerobic activity 30 plus minutes a day, 3 days per week for initial exercise prescription. Provide home exercise prescription and guidelines that participant acknowledges understanding prior to discharge.  Activity Barriers & Risk  Stratification: Activity Barriers & Cardiac Risk Stratification - 12/26/17 1008      Activity Barriers & Cardiac Risk Stratification   Activity Barriers  Deconditioning;Balance Concerns    Cardiac Risk Stratification  High       6 Minute Walk: 6 Minute Walk    Row Name 12/26/17 1008         6 Minute Walk   Phase  Initial     Distance  800 feet     Distance % Change  0 %     Distance Feet Change  0 ft     Walk Time  6 minutes     # of Rest Breaks  0     MPH  1.51     METS  2.16     RPE  13     Perceived Dyspnea   9     VO2 Peak  4.31     Symptoms  No     Resting HR  55 bpm     Resting BP  130/50     Resting Oxygen Saturation   97 %      Exercise Oxygen Saturation  during 6 min walk  95 %     Max Ex. HR  88 bpm     Max Ex. BP  160/54     2 Minute Post BP  140/50        Oxygen Initial Assessment:   Oxygen Re-Evaluation:   Oxygen Discharge (Final Oxygen Re-Evaluation):   Initial Exercise Prescription: Initial Exercise Prescription - 12/26/17 1000      Date of Initial Exercise RX and Referring Provider   Date  12/26/17    Referring Provider  Dr. Domenic Polite      NuStep   Level  1    SPM  66    Minutes  15    METs  1.6      Arm Ergometer   Level  1    Watts  7    RPM  20    Minutes  20    METs  1.4      Prescription Details   Frequency (times per week)  3    Duration  Progress to 30 minutes of continuous aerobic without signs/symptoms of physical distress      Intensity   THRR 40-80% of Max Heartrate  [phone]    Ratings of Perceived Exertion  11-13    Perceived Dyspnea  0-4      Progression   Progression  Continue progressive overload as per policy without signs/symptoms or physical distress.      Resistance Training   Training Prescription  Yes    Weight  1    Reps  10-15       Perform Capillary Blood Glucose checks as needed.  Exercise Prescription Changes: Exercise Prescription Changes    Row Name 01/12/18 1000 01/23/18 0700           Response to Exercise   Blood Pressure (Admit)  112/62  112/62      Blood Pressure (Exercise)  154/60  154/60      Blood Pressure (Exit)  126/58  126/58      Heart Rate (Admit)  57 bpm  57 bpm      Heart Rate (Exercise)  80 bpm  80 bpm      Heart Rate (Exit)  65 bpm  65 bpm      Rating of Perceived Exertion (Exercise)  13  13  Symptoms  -  - c/o being weak on last visit      Comments  -  - Patient has been out due to being in hospital. No progress      Duration  Progress to 30 minutes of  aerobic without signs/symptoms of physical distress  Progress to 30 minutes of  aerobic without signs/symptoms of physical distress      Intensity  THRR  New [phone]  THRR unchanged        Progression   Progression  Continue to progress workloads to maintain intensity without signs/symptoms of physical distress.  Continue to progress workloads to maintain intensity without signs/symptoms of physical distress.      Average METs  1.7  1.7        Resistance Training   Training Prescription  Yes  Yes      Weight  1  1      Reps  10-15  10-15        NuStep   Level  2  1      SPM  77  77      Minutes  15  15      METs  1.8  1.8        Arm Ergometer   Level  1.2  1.2      Watts  9  9      RPM  22  22      Minutes  20  20      METs  1.6  1.6        Home Exercise Plan   Plans to continue exercise at  Home (comment)  Home (comment)      Frequency  Add 2 additional days to program exercise sessions.  Add 2 additional days to program exercise sessions.      Initial Home Exercises Provided  01/14/18  01/14/18         Exercise Comments: Exercise Comments    Row Name 01/23/18 0756           Exercise Comments  Patient was getting stronger then began complaining of overall weakness. Patient has been out since hospitalization. He called to say he would not be back until he feels stronger.           Exercise Goals and Review: Exercise Goals    Row Name 12/26/17 1010             Exercise Goals   Increase Physical Activity  Yes       Intervention  Provide advice, education, support and counseling about physical activity/exercise needs.;Develop an individualized exercise prescription for aerobic and resistive training based on initial evaluation findings, risk stratification, comorbidities and participant's personal goals.       Expected Outcomes  Short Term: Attend rehab on a regular basis to increase amount of physical activity.;Long Term: Exercising regularly at least 3-5 days a week.;Long Term: Add in home exercise to make exercise part of routine and to increase amount of physical activity.       Increase Strength and Stamina   Yes       Intervention  Develop an individualized exercise prescription for aerobic and resistive training based on initial evaluation findings, risk stratification, comorbidities and participant's personal goals.;Provide advice, education, support and counseling about physical activity/exercise needs.       Expected Outcomes  Short Term: Increase workloads from initial exercise prescription for resistance, speed, and METs.       Able to  understand and use rate of perceived exertion (RPE) scale  Yes       Intervention  Provide education and explanation on how to use RPE scale       Expected Outcomes  Short Term: Able to use RPE daily in rehab to express subjective intensity level;Long Term:  Able to use RPE to guide intensity level when exercising independently       Able to understand and use Dyspnea scale  Yes       Intervention  Provide education and explanation on how to use Dyspnea scale       Expected Outcomes  Short Term: Able to use Dyspnea scale daily in rehab to express subjective sense of shortness of breath during exertion;Long Term: Able to use Dyspnea scale to guide intensity level when exercising independently       Knowledge and understanding of Target Heart Rate Range (THRR)  Yes       Intervention  Provide education and explanation of THRR including how the numbers were predicted and where they are located for reference       Expected Outcomes  Short Term: Able to state/look up THRR;Long Term: Able to use THRR to govern intensity when exercising independently;Short Term: Able to use daily as guideline for intensity in rehab       Able to check pulse independently  Yes       Intervention  Provide education and demonstration on how to check pulse in carotid and radial arteries.;Review the importance of being able to check your own pulse for safety during independent exercise       Expected Outcomes  Short Term: Able to explain why pulse checking is important during independent  exercise;Long Term: Able to check pulse independently and accurately       Understanding of Exercise Prescription  Yes       Intervention  Provide education, explanation, and written materials on patient's individual exercise prescription       Expected Outcomes  Short Term: Able to explain program exercise prescription;Long Term: Able to explain home exercise prescription to exercise independently          Exercise Goals Re-Evaluation : Exercise Goals Re-Evaluation    Row Name 12/31/17 1504 01/23/18 0752           Exercise Goal Re-Evaluation   Exercise Goals Review  Increase Strength and Stamina;Increase Physical Activity;Able to understand and use rate of perceived exertion (RPE) scale;Able to check pulse independently;Knowledge and understanding of Target Heart Rate Range (THRR);Able to understand and use Dyspnea scale;Understanding of Exercise Prescription  Increase Strength and Stamina      Comments  Patient has just started CR. patient will be progressed in time.   Patient has only been to 7 visits. He was in hospital recently due to low hemoglobin and weakness. he called and said he would not be back until he feels stronger.       Expected Outcomes  -  To get stronger          Discharge Exercise Prescription (Final Exercise Prescription Changes): Exercise Prescription Changes - 01/23/18 0700      Response to Exercise   Blood Pressure (Admit)  112/62    Blood Pressure (Exercise)  154/60    Blood Pressure (Exit)  126/58    Heart Rate (Admit)  57 bpm    Heart Rate (Exercise)  80 bpm    Heart Rate (Exit)  65 bpm    Rating of Perceived Exertion (Exercise)  13    Symptoms  -- c/o being weak on last visit    Comments  -- Patient has been out due to being in hospital. No progress    Duration  Progress to 30 minutes of  aerobic without signs/symptoms of physical distress    Intensity  THRR unchanged      Progression   Progression  Continue to progress workloads to maintain  intensity without signs/symptoms of physical distress.    Average METs  1.7      Resistance Training   Training Prescription  Yes    Weight  1    Reps  10-15      NuStep   Level  1    SPM  77    Minutes  15    METs  1.8      Arm Ergometer   Level  1.2    Watts  9    RPM  22    Minutes  20    METs  1.6      Home Exercise Plan   Plans to continue exercise at  Home (comment)    Frequency  Add 2 additional days to program exercise sessions.    Initial Home Exercises Provided  01/14/18       Nutrition:  Target Goals: Understanding of nutrition guidelines, daily intake of sodium 1500mg , cholesterol 200mg , calories 30% from fat and 7% or less from saturated fats, daily to have 5 or more servings of fruits and vegetables.  Biometrics: Pre Biometrics - 12/26/17 1010      Pre Biometrics   Height  5\' 10"  (1.778 m)    Weight  167 lb 4.8 oz (75.9 kg)    Waist Circumference  38 inches    Hip Circumference  36 inches    Waist to Hip Ratio  1.06 %    BMI (Calculated)  24.01    Triceps Skinfold  12 mm    % Body Fat  25 %    Grip Strength  61.6 kg    Flexibility  0 in    Single Leg Stand  0 seconds        Nutrition Therapy Plan and Nutrition Goals: Nutrition Therapy & Goals - 01/03/18 1506      Personal Nutrition Goals   Nutrition Goal  For heart healthy choices add >50% of whole grains, make half their plate fruits and vegetables. Discuss the difference between starchy vegetables and leafy greens, and how leafy vegetables provide fiber, helps maintain healthy weight, helps control blood glucose, and lowers cholesterol.  Discuss purchasing fresh or frozen vegetable to reduce sodium and not to add grease, fat or sugar. Consume <18oz of red meat per week. Consume lean cuts of meats and very little of meats high in sodium and nitrates such as pork and lunch meats. Discussed portion control for all food groups.      Additional Goals?  No      Intervention Plan   Intervention   Nutrition handout(s) given to patient.    Expected Outcomes  Short Term Goal: Understand basic principles of dietary content, such as calories, fat, sodium, cholesterol and nutrients.       Nutrition Assessments: Nutrition Assessments - 12/26/17 1115      MEDFICTS Scores   Pre Score  36       Nutrition Goals Re-Evaluation:   Nutrition Goals Discharge (Final Nutrition Goals Re-Evaluation):   Psychosocial: Target Goals: Acknowledge presence or absence of significant depression and/or stress, maximize  coping skills, provide positive support system. Participant is able to verbalize types and ability to use techniques and skills needed for reducing stress and depression.  Initial Review & Psychosocial Screening: Initial Psych Review & Screening - 12/26/17 1117      Initial Review   Current issues with  None Identified Other than being sad about his wife being in nursing home, he has no depression.       Family Dynamics   Good Support System?  Yes      Barriers   Psychosocial barriers to participate in program  There are no identifiable barriers or psychosocial needs. Shared QOL scores with patient.       Screening Interventions   Interventions  Encouraged to exercise;Provide feedback about the scores to participant    Expected Outcomes  Short Term goal: Identification and review with participant of any Quality of Life or Depression concerns found by scoring the questionnaire.;Long Term goal: The participant improves quality of Life and PHQ9 Scores as seen by post scores and/or verbalization of changes       Quality of Life Scores: Quality of Life - 12/26/17 0909      Quality of Life   Select  Quality of Life      Quality of Life Scores   Health/Function Pre  19.57 %    Socioeconomic Pre  21 %    Psych/Spiritual Pre  18.5 %    Family Pre  21.1 %    GLOBAL Pre  19.83 %      Scores of 19 and below usually indicate a poorer quality of life in these areas.  A difference of   2-3 points is a clinically meaningful difference.  A difference of 2-3 points in the total score of the Quality of Life Index has been associated with significant improvement in overall quality of life, self-image, physical symptoms, and general health in studies assessing change in quality of life.  PHQ-9: Recent Review Flowsheet Data    Depression screen [facility] 2/9 12/26/2017   Decreased Interest 0   Down, Depressed, Hopeless 1   PHQ - 2 Score 1   Altered sleeping 0   Tired, decreased energy 2   Change in appetite 0   Feeling bad or failure about yourself  0   Trouble concentrating 0   Moving slowly or fidgety/restless 0   Suicidal thoughts 0   PHQ-9 Score 3   Difficult doing work/chores Not difficult at all     Interpretation of Total Score  Total Score Depression Severity:  1-4 = Minimal depression, 5-9 = Mild depression, 10-14 = Moderate depression, 15-19 = Moderately severe depression, 20-27 = Severe depression   Psychosocial Evaluation and Intervention: Psychosocial Evaluation - 12/26/17 1119      Psychosocial Evaluation & Interventions   Interventions  Encouraged to exercise with the program and follow exercise prescription    Continue Psychosocial Services   No Follow up required       Psychosocial Re-Evaluation: Psychosocial Re-Evaluation    [place] Name 12/26/17 1119             Psychosocial Re-Evaluation   Current issues with  None Identified       Continue Psychosocial Services   Follow up required by staff Will check on lhow he is feeling emotionally.           Psychosocial Discharge (Final Psychosocial Re-Evaluation): Psychosocial Re-Evaluation - 12/26/17 1119      Psychosocial Re-Evaluation   Current issues with  None Identified    Continue Psychosocial Services   Follow up required by staff Will check on lhow he is feeling emotionally.        Vocational Rehabilitation: Provide vocational rehab assistance to qualifying candidates.   Vocational Rehab  Evaluation & Intervention: Vocational Rehab - 12/26/17 1107      Initial Vocational Rehab Evaluation & Intervention   Assessment shows need for Vocational Rehabilitation  No       Education: Education Goals: Education classes will be provided on a weekly basis, covering required topics. Participant will state understanding/return demonstration of topics presented.  Learning Barriers/Preferences: Learning Barriers/Preferences - 12/26/17 1106      Learning Barriers/Preferences   Learning Barriers  Hearing    Learning Preferences  Skilled Demonstration;Individual Instruction;Group Instruction       Education Topics: Hypertension, Hypertension Reduction -Define heart disease and high blood pressure. Discus how high blood pressure affects the body and ways to reduce high blood pressure.   Exercise and Your Heart -Discuss why it is important to exercise, the FITT principles of exercise, normal and abnormal responses to exercise, and how to exercise safely.   Angina -Discuss definition of angina, causes of angina, treatment of angina, and how to decrease risk of having angina.   Cardiac Medications -Review what the following cardiac medications are used for, how they affect the body, and side effects that may occur when taking the medications.  Medications include Aspirin, Beta blockers, calcium channel blockers, ACE Inhibitors, angiotensin receptor blockers, diuretics, digoxin, and antihyperlipidemics.   Congestive Heart Failure -Discuss the definition of CHF, how to live with CHF, the signs and symptoms of CHF, and how keep track of weight and sodium intake.   Heart Disease and Intimacy -Discus the effect sexual activity has on the heart, how changes occur during intimacy as we age, and safety during sexual activity.   Smoking Cessation / COPD -Discuss different methods to quit smoking, the health benefits of quitting smoking, and the definition of COPD.   Nutrition I:  Fats -Discuss the types of cholesterol, what cholesterol does to the heart, and how cholesterol levels can be controlled.   Nutrition II: Labels -Discuss the different components of food labels and how to read food label   Heart Parts/Heart Disease and PAD -Discuss the anatomy of the heart, the pathway of blood circulation through the heart, and these are affected by heart disease.   CARDIAC REHAB PHASE II EXERCISE from 01/09/2018 in [place]  Date  01/02/18  Educator  DC  Instruction Review Code  2- Demonstrated Understanding      Stress I: Signs and Symptoms -Discuss the causes of stress, how stress may lead to anxiety and depression, and ways to limit stress.   CARDIAC REHAB PHASE II EXERCISE from 01/09/2018 in [place]  Date  01/09/18  Educator  D. Coad  Instruction Review Code  2- Demonstrated Understanding      Stress II: Relaxation -Discuss different types of relaxation techniques to limit stress.   Warning Signs of Stroke / TIA -Discuss definition of a stroke, what the signs and symptoms are of a stroke, and how to identify when someone is having stroke.   Knowledge Questionnaire Score: Knowledge Questionnaire Score - 12/26/17 1107      Knowledge Questionnaire Score   Pre Score  19/24       Core Components/Risk Factors/Patient Goals at Admission: Personal Goals and Risk Factors at Admission - 12/26/17 1116  Core Components/Risk Factors/Patient Goals on Admission    Weight Management  Weight Maintenance    Personal Goal Other  Yes    Personal Goal  Get stronger and gain more strength in his legs.     Intervention  Attend CR 3 x week and supplement with at home exercise 2 x week.     Expected Outcomes  Achieve personal goals.       Core Components/Risk Factors/Patient Goals Review:    Core Components/Risk Factors/Patient Goals at Discharge (Final Review):    ITP Comments: ITP Comments    Row Name  12/26/17 1110 12/31/17 1334 01/03/18 1506 01/30/18 0841     ITP Comments  Mr. Regina is a pleasant 84 year old man. He starts with [place] on Monday 12/31/17 at the 9:30 class. He has a little saddness because his wife of 26 years has been placed in a nursing facility due to her having Alzheimer's. He is eager to get started and is looking forward to the fellowship of the program along with and him getting stronger.   Patient new to program. He has completed 2 sessions. Will continue to monitor for progress.   Patient attended the Family Matters class with hospital chaplian to discuss how this event has impacted their life.   Patient stopped attending after completing 7 sessions due to GI bleed and low Hgb. Dr. Domenic Polite has recommended that he not return to the program. MD will be notified.        Comments: Patient stopped coming to Cardiac Rehab on  01/11/18 dur to GI bleed and low Hgb. Dr. Domenic Polite recommended that he not return to the program. Doctor will be informed.

## 2018-02-01 ENCOUNTER — Encounter (HOSPITAL_COMMUNITY): Payer: Medicare Other

## 2018-02-04 ENCOUNTER — Encounter (HOSPITAL_COMMUNITY): Payer: Medicare Other

## 2018-02-06 ENCOUNTER — Encounter (HOSPITAL_COMMUNITY): Payer: Medicare Other

## 2018-02-08 ENCOUNTER — Encounter (HOSPITAL_COMMUNITY): Payer: Medicare Other

## 2018-02-11 ENCOUNTER — Encounter (HOSPITAL_COMMUNITY): Payer: Medicare Other

## 2018-02-13 ENCOUNTER — Encounter (HOSPITAL_COMMUNITY): Payer: Medicare Other

## 2018-02-15 ENCOUNTER — Encounter (HOSPITAL_COMMUNITY): Payer: Medicare Other

## 2018-02-18 ENCOUNTER — Encounter (HOSPITAL_COMMUNITY): Payer: Medicare Other

## 2018-02-20 ENCOUNTER — Encounter (HOSPITAL_COMMUNITY): Payer: Medicare Other

## 2018-02-22 ENCOUNTER — Encounter (HOSPITAL_COMMUNITY): Payer: Medicare Other

## 2018-02-25 ENCOUNTER — Encounter (HOSPITAL_COMMUNITY): Payer: Medicare Other

## 2018-02-27 ENCOUNTER — Encounter (HOSPITAL_COMMUNITY): Payer: Medicare Other

## 2018-03-01 ENCOUNTER — Encounter (HOSPITAL_COMMUNITY): Payer: Medicare Other

## 2018-03-04 ENCOUNTER — Encounter (HOSPITAL_COMMUNITY): Payer: Medicare Other

## 2018-03-06 ENCOUNTER — Encounter (HOSPITAL_COMMUNITY): Payer: Medicare Other

## 2018-03-08 ENCOUNTER — Encounter (HOSPITAL_COMMUNITY): Payer: Medicare Other

## 2018-03-11 ENCOUNTER — Encounter (HOSPITAL_COMMUNITY): Payer: Medicare Other

## 2018-03-12 ENCOUNTER — Other Ambulatory Visit: Payer: Self-pay

## 2018-03-12 MED ORDER — CARVEDILOL 3.125 MG PO TABS: 3.1250 mg | ORAL_TABLET | Freq: Two times a day (BID) | ORAL | 3 refills | Status: DC

## 2018-03-12 MED ORDER — ATORVASTATIN CALCIUM 80 MG PO TABS: ORAL_TABLET | ORAL | 3 refills | Status: DC

## 2018-03-13 ENCOUNTER — Encounter (HOSPITAL_COMMUNITY): Payer: Medicare Other

## 2018-03-15 ENCOUNTER — Encounter (HOSPITAL_COMMUNITY): Payer: Medicare Other

## 2018-03-18 ENCOUNTER — Encounter: Payer: Self-pay | Admitting: Cardiology

## 2018-03-18 ENCOUNTER — Encounter (HOSPITAL_COMMUNITY): Payer: Medicare Other

## 2018-03-20 ENCOUNTER — Encounter (HOSPITAL_COMMUNITY): Payer: Medicare Other

## 2018-03-22 ENCOUNTER — Encounter (HOSPITAL_COMMUNITY): Payer: Medicare Other

## 2018-03-31 ENCOUNTER — Inpatient Hospital Stay (HOSPITAL_COMMUNITY)
Admission: EM | Admit: 2018-03-31 | Discharge: 2018-04-05 | DRG: 291 | Disposition: A | Discharge status: Home Health | Payer: Medicare Other | Attending: Family Medicine | Admitting: Family Medicine

## 2018-03-31 ENCOUNTER — Other Ambulatory Visit: Payer: Self-pay

## 2018-03-31 ENCOUNTER — Encounter (HOSPITAL_COMMUNITY): Payer: Self-pay | Admitting: *Deleted

## 2018-03-31 ENCOUNTER — Emergency Department (HOSPITAL_COMMUNITY): Payer: Medicare Other

## 2018-03-31 DIAGNOSIS — I255 Ischemic cardiomyopathy: Secondary | ICD-10-CM | POA: Diagnosis present

## 2018-03-31 DIAGNOSIS — Z79899 Other long term (current) drug therapy: Secondary | ICD-10-CM

## 2018-03-31 DIAGNOSIS — D500 Iron deficiency anemia secondary to blood loss (chronic): Secondary | ICD-10-CM | POA: Diagnosis present

## 2018-03-31 DIAGNOSIS — L89311 Pressure ulcer of right buttock, stage 1: Secondary | ICD-10-CM | POA: Diagnosis present

## 2018-03-31 DIAGNOSIS — R319 Hematuria, unspecified: Secondary | ICD-10-CM | POA: Diagnosis not present

## 2018-03-31 DIAGNOSIS — H9192 Unspecified hearing loss, left ear: Secondary | ICD-10-CM | POA: Diagnosis present

## 2018-03-31 DIAGNOSIS — I272 Pulmonary hypertension, unspecified: Secondary | ICD-10-CM | POA: Diagnosis present

## 2018-03-31 DIAGNOSIS — E039 Hypothyroidism, unspecified: Secondary | ICD-10-CM | POA: Diagnosis not present

## 2018-03-31 DIAGNOSIS — D539 Nutritional anemia, unspecified: Secondary | ICD-10-CM | POA: Diagnosis present

## 2018-03-31 DIAGNOSIS — I5023 Acute on chronic systolic (congestive) heart failure: Secondary | ICD-10-CM | POA: Diagnosis not present

## 2018-03-31 DIAGNOSIS — N401 Enlarged prostate with lower urinary tract symptoms: Secondary | ICD-10-CM | POA: Diagnosis present

## 2018-03-31 DIAGNOSIS — I10 Essential (primary) hypertension: Secondary | ICD-10-CM | POA: Diagnosis present

## 2018-03-31 DIAGNOSIS — I252 Old myocardial infarction: Secondary | ICD-10-CM

## 2018-03-31 DIAGNOSIS — I130 Hypertensive heart and chronic kidney disease with heart failure and stage 1 through stage 4 chronic kidney disease, or unspecified chronic kidney disease: Principal | ICD-10-CM | POA: Diagnosis present

## 2018-03-31 DIAGNOSIS — E118 Type 2 diabetes mellitus with unspecified complications: Secondary | ICD-10-CM | POA: Diagnosis present

## 2018-03-31 DIAGNOSIS — I2510 Atherosclerotic heart disease of native coronary artery without angina pectoris: Secondary | ICD-10-CM | POA: Diagnosis present

## 2018-03-31 DIAGNOSIS — I25110 Atherosclerotic heart disease of native coronary artery with unstable angina pectoris: Secondary | ICD-10-CM | POA: Diagnosis not present

## 2018-03-31 DIAGNOSIS — L89301 Pressure ulcer of unspecified buttock, stage 1: Secondary | ICD-10-CM | POA: Diagnosis not present

## 2018-03-31 DIAGNOSIS — E785 Hyperlipidemia, unspecified: Secondary | ICD-10-CM | POA: Diagnosis present

## 2018-03-31 DIAGNOSIS — Z8249 Family history of ischemic heart disease and other diseases of the circulatory system: Secondary | ICD-10-CM

## 2018-03-31 DIAGNOSIS — I5030 Unspecified diastolic (congestive) heart failure: Secondary | ICD-10-CM | POA: Diagnosis not present

## 2018-03-31 DIAGNOSIS — Z87891 Personal history of nicotine dependence: Secondary | ICD-10-CM

## 2018-03-31 DIAGNOSIS — I340 Nonrheumatic mitral (valve) insufficiency: Secondary | ICD-10-CM | POA: Diagnosis present

## 2018-03-31 DIAGNOSIS — Z7982 Long term (current) use of aspirin: Secondary | ICD-10-CM

## 2018-03-31 DIAGNOSIS — N179 Acute kidney failure, unspecified: Secondary | ICD-10-CM | POA: Diagnosis not present

## 2018-03-31 DIAGNOSIS — R338 Other retention of urine: Secondary | ICD-10-CM | POA: Diagnosis present

## 2018-03-31 DIAGNOSIS — N183 Chronic kidney disease, stage 3 unspecified: Secondary | ICD-10-CM | POA: Diagnosis present

## 2018-03-31 DIAGNOSIS — N170 Acute kidney failure with tubular necrosis: Secondary | ICD-10-CM | POA: Diagnosis not present

## 2018-03-31 DIAGNOSIS — Z955 Presence of coronary angioplasty implant and graft: Secondary | ICD-10-CM

## 2018-03-31 DIAGNOSIS — I509 Heart failure, unspecified: Secondary | ICD-10-CM

## 2018-03-31 DIAGNOSIS — K219 Gastro-esophageal reflux disease without esophagitis: Secondary | ICD-10-CM | POA: Diagnosis not present

## 2018-03-31 DIAGNOSIS — E1122 Type 2 diabetes mellitus with diabetic chronic kidney disease: Secondary | ICD-10-CM | POA: Diagnosis present

## 2018-03-31 DIAGNOSIS — E782 Mixed hyperlipidemia: Secondary | ICD-10-CM | POA: Diagnosis present

## 2018-03-31 DIAGNOSIS — Z7902 Long term (current) use of antithrombotics/antiplatelets: Secondary | ICD-10-CM

## 2018-03-31 DIAGNOSIS — Z974 Presence of external hearing-aid: Secondary | ICD-10-CM

## 2018-03-31 DIAGNOSIS — I5021 Acute systolic (congestive) heart failure: Secondary | ICD-10-CM | POA: Diagnosis not present

## 2018-03-31 DIAGNOSIS — E43 Unspecified severe protein-calorie malnutrition: Secondary | ICD-10-CM | POA: Diagnosis present

## 2018-03-31 DIAGNOSIS — Z6823 Body mass index (BMI) 23.0-23.9, adult: Secondary | ICD-10-CM

## 2018-03-31 DIAGNOSIS — Z79890 Hormone replacement therapy (postmenopausal): Secondary | ICD-10-CM

## 2018-03-31 LAB — BASIC METABOLIC PANEL
ANION GAP: 9 (ref 5–15)
BUN: 34 mg/dL — ABNORMAL HIGH (ref 8–23)
CO2: 19 mmol/L — AB (ref 22–32)
Calcium: 8.9 mg/dL (ref 8.9–10.3)
Chloride: 110 mmol/L (ref 98–111)
Creatinine, Ser: 2.15 mg/dL — ABNORMAL HIGH (ref 0.61–1.24)
GFR calc Af Amer: 29 mL/min — ABNORMAL LOW (ref >60)
GFR calc non Af Amer: 25 mL/min — ABNORMAL LOW (ref >60)
GLUCOSE: 212 mg/dL — AB (ref 70–99)
POTASSIUM: 5.6 mmol/L — AB (ref 3.5–5.1)
Sodium: 138 mmol/L (ref 135–145)

## 2018-03-31 LAB — CREATININE, SERUM
Creatinine, Ser: 2.1 mg/dL — ABNORMAL HIGH (ref 0.61–1.24)
GFR calc Af Amer: 30 mL/min — ABNORMAL LOW (ref >60)
GFR, EST NON AFRICAN AMERICAN: 26 mL/min — AB (ref >60)

## 2018-03-31 LAB — CBC WITH DIFFERENTIAL/PLATELET
ABS IMMATURE GRANULOCYTES: 0.1 10*3/uL (ref 0.0–0.1)
Basophils Absolute: 0 10*3/uL (ref 0.0–0.1)
Basophils Relative: 0 %
Eosinophils Absolute: 0.3 10*3/uL (ref 0.0–0.7)
Eosinophils Relative: 2 %
HCT: 29.4 % — ABNORMAL LOW (ref 39.0–52.0)
HEMOGLOBIN: 9 g/dL — AB (ref 13.0–17.0)
Immature Granulocytes: 1 %
LYMPHS PCT: 16 %
Lymphs Abs: 2.2 10*3/uL (ref 0.7–4.0)
MCH: 32.8 pg (ref 26.0–34.0)
MCHC: 30.6 g/dL (ref 30.0–36.0)
MCV: 107.3 fL — AB (ref 78.0–100.0)
MONO ABS: 1.3 10*3/uL — AB (ref 0.1–1.0)
MONOS PCT: 10 %
NEUTROS ABS: 10 10*3/uL — AB (ref 1.7–7.7)
Neutrophils Relative %: 71 %
Platelets: 203 10*3/uL (ref 150–400)
RBC: 2.74 MIL/uL — ABNORMAL LOW (ref 4.22–5.81)
RDW: 15.8 % — ABNORMAL HIGH (ref 11.5–15.5)
WBC: 13.9 10*3/uL — ABNORMAL HIGH (ref 4.0–10.5)

## 2018-03-31 LAB — CBC
HEMATOCRIT: 27.4 % — AB (ref 39.0–52.0)
Hemoglobin: 8.6 g/dL — ABNORMAL LOW (ref 13.0–17.0)
MCH: 32.1 pg (ref 26.0–34.0)
MCHC: 31.4 g/dL (ref 30.0–36.0)
MCV: 102.2 fL — AB (ref 78.0–100.0)
Platelets: 177 10*3/uL (ref 150–400)
RBC: 2.68 MIL/uL — ABNORMAL LOW (ref 4.22–5.81)
RDW: 15.7 % — ABNORMAL HIGH (ref 11.5–15.5)
WBC: 9.6 10*3/uL (ref 4.0–10.5)

## 2018-03-31 LAB — I-STAT TROPONIN, ED: Troponin i, poc: 0.03 ng/mL (ref 0.00–0.08)

## 2018-03-31 LAB — BRAIN NATRIURETIC PEPTIDE: B NATRIURETIC PEPTIDE 5: 650.5 pg/mL — AB (ref 0.0–100.0)

## 2018-03-31 MED ORDER — LEVOTHYROXINE SODIUM 50 MCG PO TABS
50.0000 ug | ORAL_TABLET | Freq: Every day | ORAL | Status: DC
  Administered 2018-04-01 – 2018-04-05 (×5): 50 ug via ORAL
  Filled 2018-03-31 (×5): qty 1

## 2018-03-31 MED ORDER — ACETAMINOPHEN 325 MG PO TABS: 650.0000 mg | ORAL_TABLET | ORAL | Status: DC | PRN

## 2018-03-31 MED ORDER — NITROGLYCERIN 2 % TD OINT
1.0000 [in_us] | TOPICAL_OINTMENT | Freq: Once | TRANSDERMAL | Status: AC
  Administered 2018-03-31: 1 [in_us] via TOPICAL
  Filled 2018-03-31: qty 1

## 2018-03-31 MED ORDER — CARVEDILOL 3.125 MG PO TABS
3.1250 mg | ORAL_TABLET | Freq: Two times a day (BID) | ORAL | Status: DC
  Administered 2018-03-31 – 2018-04-05 (×10): 3.125 mg via ORAL
  Filled 2018-03-31 (×10): qty 1

## 2018-03-31 MED ORDER — SODIUM CHLORIDE 0.9% FLUSH
3.0000 mL | Freq: Two times a day (BID) | INTRAVENOUS | Status: DC
  Administered 2018-03-31 – 2018-04-04 (×8): 3 mL via INTRAVENOUS

## 2018-03-31 MED ORDER — FUROSEMIDE 10 MG/ML IJ SOLN
40.0000 mg | Freq: Two times a day (BID) | INTRAMUSCULAR | Status: DC
  Administered 2018-03-31 – 2018-04-02 (×6): 40 mg via INTRAVENOUS
  Filled 2018-03-31 (×6): qty 4

## 2018-03-31 MED ORDER — ATORVASTATIN CALCIUM 80 MG PO TABS
80.0000 mg | ORAL_TABLET | Freq: Every day | ORAL | Status: DC
  Administered 2018-03-31 – 2018-04-04 (×5): 80 mg via ORAL
  Filled 2018-03-31 (×5): qty 1

## 2018-03-31 MED ORDER — SODIUM CHLORIDE 0.9 % IV SOLN
250.0000 mL | INTRAVENOUS | Status: DC | PRN
  Administered 2018-04-01: 250 mL via INTRAVENOUS

## 2018-03-31 MED ORDER — ONDANSETRON HCL 4 MG/2ML IJ SOLN: 4.0000 mg | Freq: Four times a day (QID) | INTRAMUSCULAR | Status: DC | PRN

## 2018-03-31 MED ORDER — ENOXAPARIN SODIUM 30 MG/0.3ML ~~LOC~~ SOLN
30.0000 mg | Freq: Every day | SUBCUTANEOUS | Status: DC
  Administered 2018-03-31 – 2018-04-05 (×6): 30 mg via SUBCUTANEOUS
  Filled 2018-03-31 (×6): qty 0.3

## 2018-03-31 MED ORDER — PANTOPRAZOLE SODIUM 40 MG PO TBEC
40.0000 mg | DELAYED_RELEASE_TABLET | Freq: Every day | ORAL | Status: DC
  Administered 2018-03-31 – 2018-04-05 (×6): 40 mg via ORAL
  Filled 2018-03-31 (×6): qty 1

## 2018-03-31 MED ORDER — ASPIRIN EC 81 MG PO TBEC
81.0000 mg | DELAYED_RELEASE_TABLET | Freq: Every day | ORAL | Status: DC
  Administered 2018-03-31 – 2018-04-05 (×6): 81 mg via ORAL
  Filled 2018-03-31 (×6): qty 1

## 2018-03-31 MED ORDER — TAMSULOSIN HCL 0.4 MG PO CAPS
0.4000 mg | ORAL_CAPSULE | Freq: Every day | ORAL | Status: DC
  Administered 2018-03-31 – 2018-04-05 (×6): 0.4 mg via ORAL
  Filled 2018-03-31 (×6): qty 1

## 2018-03-31 MED ORDER — FINASTERIDE 5 MG PO TABS
5.0000 mg | ORAL_TABLET | Freq: Every day | ORAL | Status: DC
  Administered 2018-03-31 – 2018-04-05 (×6): 5 mg via ORAL
  Filled 2018-03-31 (×6): qty 1

## 2018-03-31 MED ORDER — CLOPIDOGREL BISULFATE 75 MG PO TABS
75.0000 mg | ORAL_TABLET | Freq: Every day | ORAL | Status: DC
  Administered 2018-03-31 – 2018-04-05 (×6): 75 mg via ORAL
  Filled 2018-03-31 (×6): qty 1

## 2018-03-31 MED ORDER — SODIUM CHLORIDE 0.9% FLUSH: 3.0000 mL | INTRAVENOUS | Status: DC | PRN

## 2018-03-31 MED ORDER — FUROSEMIDE 10 MG/ML IJ SOLN
40.0000 mg | Freq: Once | INTRAMUSCULAR | Status: AC
  Administered 2018-03-31: 40 mg via INTRAVENOUS
  Filled 2018-03-31: qty 4

## 2018-03-31 NOTE — ED Triage Notes (Signed)
The pt arrived by [facility] from [place]  Where the rescue squad there asked them to bring the pt here.  Pt sob for several days  No chest pain  Pt on non-rebreather on arrival iv per ems  No family in [place]  Daughter in [place]. Feet and ankles swollen   allert and oriented skion warm and dry

## 2018-03-31 NOTE — ED Notes (Signed)
Breathing  Appears to be some better

## 2018-03-31 NOTE — ED Notes (Signed)
Much improved  Output good.  The pt reports that he feels so much better

## 2018-03-31 NOTE — H&P (Addendum)
History and Physical  Randy Fitzpatrick. NWG:[id number] DOB: [birth year] DOA: 03/31/2018  Referring physician: Stark Jock PCP: Ranae Palms, MD  Outpatient Specialists: Unknown Patient coming from: Ambulates & is able to ambulate yes  Chief Complaint: Shortness of breath  HPI: Randy Fitzpatrick. is a 84 y.o. male with medical history significant for hypertension, type 2 diabetes mellitus, hyperlipidemia, coronary artery disease S/P MI STEMI with stent placement about 5 months ago he presents with ongoing shortness of breath for 3 days but he became worse the evening of presentation to the emergency room he denies any chest pain fever chills or productive cough he reports some swelling in his feet.  His troponin was slightly elevated it was 0.03 he is slightly anemic with hemoglobin of 13 9 and 8.6 his ejection fraction by a previous echocardiogram is 45% BNP 650 ED Course: Patient was given IV Lasix  Review of Systems: Respiratory shortness of breath Musculoskeletal mild edema Pt denies any no fever no chills no chest pain.  Review of systems are otherwise negative   Past Medical History:  Diagnosis Date  . Acute ST elevation myocardial infarction (STEMI) of inferior wall (HCC) 11/03/2017   PCI/DESx3 to RCA after wire dissection. EF 45-50%  . BPH (benign prostatic hyperplasia)   . CAD (coronary artery disease)    Multivessel/left main disease  . Hypothyroidism   . Mitral regurgitation   . Mixed hyperlipidemia   . Type 2 diabetes mellitus ([place])    Past Surgical History:  Procedure Laterality Date  . CORONARY STENT INTERVENTION N/A 11/03/2017   Procedure: CORONARY STENT INTERVENTION;  Surgeon: Lorretta Harp, MD;  Location: [place] [facility];  Service: Cardiovascular;  Laterality: N/A;  . CORONARY/GRAFT ACUTE MI REVASCULARIZATION N/A 11/03/2017   Procedure: Coronary/Graft Acute MI Revascularization;  Surgeon: Lorretta Harp, MD;  Location: [place] [facility];  Service:  Cardiovascular;  Laterality: N/A;  . LEFT HEART CATH AND CORONARY ANGIOGRAPHY N/A 11/03/2017   Procedure: LEFT HEART CATH AND CORONARY ANGIOGRAPHY;  Surgeon: Lorretta Harp, MD;  Location: [place] [facility];  Service: Cardiovascular;  Laterality: N/A;  . STOMACH SURGERY     Treatment of an abscess    Social History:  reports that he quit smoking about 35 years ago. His smoking use included cigarettes. He has a 40.00 pack-year smoking history. He has never used smokeless tobacco. He reports that he does not drink alcohol or use drugs.   No Known Allergies  Family History  Problem Relation Age of Onset  . Heart failure Father 106  . Parkinsonism Mother 58      Prior to Admission medications   Medication Sig Start Date End Date Taking? Authorizing Provider  aspirin EC 81 MG EC tablet Take 1 tablet (81 mg total) by mouth daily. 11/09/17  Yes Cheryln Manly, NP  atorvastatin (LIPITOR) 80 MG tablet TAKE 1 TABLET BY MOUTH DAILY AT 6 PM. Patient taking differently: Take 80 mg by mouth daily at 6 PM.  03/12/18  Yes Strader, [place] M, PA-C  carvedilol (COREG) 3.125 MG tablet Take 1 tablet (3.125 mg total) by mouth 2 (two) times daily with a meal. 03/12/18  Yes Strader, [place] M, PA-C  clopidogrel (PLAVIX) 75 MG tablet Take 1 tablet (75 mg total) by mouth daily. 11/23/17  Yes Strader, [place] M, PA-C  finasteride (PROSCAR) 5 MG tablet Take 5 mg by mouth daily.   Yes [provider]  levothyroxine (SYNTHROID, LEVOTHROID) 50 MCG tablet Take  50 mcg by mouth daily.  02/29/12  Yes [provider]  nitroGLYCERIN (NITROSTAT) 0.4 MG SL tablet Place 1 tablet (0.4 mg total) under the tongue every 5 (five) minutes x 3 doses as needed for chest pain. 11/08/17  Yes Reino Bellis B, NP  pantoprazole (PROTONIX) 40 MG tablet Take 40 mg by mouth 2 (two) times daily.   Yes [provider]  tamsulosin (FLOMAX) 0.4 MG CAPS capsule Take 0.4 mg by mouth daily.    Yes [provider]    Physical Exam: BP 137/62 (BP Location: Right Arm)   Pulse 66   Temp 98.3 F (36.8 C) (Oral)   Resp 19   Ht 5\' 10"  (1.778 m)   Wt 75 kg   SpO2 100%   BMI 23.72 kg/m   General: Frail looking elderly male alert oriented x3 well-developed well-nourished Eyes: Clear no icteric ENT: Nares are clear Neck: Supple Cardiovascular: Regular rate and rhythm no murmur no friction rub peripheral pulses palpable bilaterally he does have 2+ pitting edema in both lower extremity Respiratory: Effort was normal no distress he has no wheeze but has some bilateral basal rales Abdomen: Soft full nontender no hepatosplenomegaly Skin: Warm and dry no rashes Musculoskeletal: Moves all 4 extremities positive edema Psychiatric: Appropriate affect Neurologic: Speech is clear alert oriented to person place and time          Labs on Admission:  Basic Metabolic Panel: Recent Labs  Lab 03/31/18 0349 03/31/18 1029  NA 138  --   K 5.6*  --   CL 110  --   CO2 19*  --   GLUCOSE 212*  --   BUN 34*  --   CREATININE 2.15* 2.10*  CALCIUM 8.9  --    Liver Function Tests: No results for input(s): AST, ALT, ALKPHOS, BILITOT, PROT, ALBUMIN in the last 168 hours. No results for input(s): LIPASE, AMYLASE in the last 168 hours. No results for input(s): AMMONIA in the last 168 hours. CBC: Recent Labs  Lab 03/31/18 0349 03/31/18 1029  WBC 13.9* 9.6  NEUTROABS 10.0*  --   HGB 9.0* 8.6*  HCT 29.4* 27.4*  MCV 107.3* 102.2*  PLT 203 177   Cardiac Enzymes: No results for input(s): CKTOTAL, CKMB, CKMBINDEX, TROPONINI in the last 168 hours.  BNP (last 3 results) Recent Labs    03/31/18 0411  BNP 650.5*    ProBNP (last 3 results) No results for input(s): PROBNP in the last 8760 hours.  CBG: No results for input(s): GLUCAP in the last 168 hours.  Radiological Exams on Admission: Dg Chest Port 1 View  Result Date: 03/31/2018 CLINICAL DATA:  84 y/o M; feet and ankles swollen.  Shortness of breath. EXAM: PORTABLE CHEST 1 VIEW COMPARISON:  01/13/2018 chest radiograph. FINDINGS: Stable normal cardiac silhouette given projection and technique. Aortic atherosclerosis with calcification. Increased reticular and peripheral linear opacities of the lungs. No consolidation or pneumothorax. Possible small left effusion. No acute osseous abnormality is evident. IMPRESSION: Interstitial opacities, likely edema.  Possible small left effusion. Electronically Signed   By: Kristine Garbe M.D.   On: 03/31/2018 04:33    EKG:  Per ER MD  Assessment/Plan Present on Admission: . Hyperlipidemia with target LDL less than 70 . Type 2 diabetes mellitus with complication, without long-term current use of insulin ([place]) . Coronary artery disease involving native coronary artery of native heart with unstable angina pectoris ([place]) . Left main coronary artery disease . Moderate mitral regurgitation by prior  echocardiogram . Acute renal failure superimposed on stage 3 chronic kidney disease ([place]) . Essential hypertension . Hypothyroidism (acquired) . GERD (gastroesophageal reflux disease)  1.  Acute congestive heart failure on chronic heart failure ejection fraction is 45% we will continue gentle IV diuresis.  Advised consulted with cardiology who advised gentle diuresis 2.  Coronary artery disease status post MI with 3 stent placement 5 months ago continue his current heart medications 3.  Chronic kidney disease gentle hydration his creatinine is 2.1 today we will monitor his electrolytes and kidney function. 4.  Diabetes mellitus continue current medication  Principal Problem:   Acute CHF (congestive heart failure) (HCC) Active Problems:   Coronary artery disease involving native coronary artery of native heart with unstable angina pectoris (HCC)   Left main coronary artery disease   Hyperlipidemia with target LDL less than 70   Type 2 diabetes mellitus with complication, without  long-term current use of insulin (HCC)   Moderate mitral regurgitation by prior echocardiogram   Acute renal failure superimposed on stage 3 chronic kidney disease (HCC)   Essential hypertension   Hypothyroidism (acquired)   GERD (gastroesophageal reflux disease)   Congestive heart failure (CHF) (HCC)   DVT prophylaxis: Heparin  Code Status: Full code  Family Communication: None at bedside  Disposition Plan: Home when stable  Consults called: Curbside cardiology  Admission status: Inpatient    Cristal Deer MD [facility] Pager [phone]  If 7PM-7AM, please contact night-coverage [email or web address] Password [email or web address]  03/31/2018, 3:52 PM

## 2018-03-31 NOTE — ED Provider Notes (Addendum)
[facility] EMERGENCY DEPARTMENT Provider Note   CSN: [id number] Arrival date & time: 03/31/18  7124     History   Chief Complaint No chief complaint on file.   HPI Randy Arment. is a 84 y.o. male.  Patient is a 84 year old male with past medical history of coronary artery disease with recent MI requiring stent.  He also has a history of diabetes.  He presents today for evaluation of shortness of breath.  This has been ongoing for the past 3 days, however became worse this evening.  He denies any chest pain, fevers, chills, or productive cough.  He does report some swelling of his legs.  The history is provided by the patient.  Shortness of Breath  This is a new problem. The average episode lasts 3 days. The problem occurs continuously.The problem has been gradually worsening. Associated symptoms include leg swelling. Pertinent negatives include no fever, no cough, no sputum production and no chest pain. He has tried nothing for the symptoms.    Past Medical History:  Diagnosis Date  . Acute ST elevation myocardial infarction (STEMI) of inferior wall (HCC) 11/03/2017   PCI/DESx3 to RCA after wire dissection. EF 45-50%  . BPH (benign prostatic hyperplasia)   . CAD (coronary artery disease)    Multivessel/left main disease  . Hypothyroidism   . Mitral regurgitation   . Mixed hyperlipidemia   . Type 2 diabetes mellitus [facility])     Patient Active Problem List   Diagnosis Date Noted  . Essential hypertension 11/23/2017  . Acute renal failure superimposed on stage 3 chronic kidney disease ([place]) 11/07/2017  . Coronary artery disease involving native coronary artery of native heart with unstable angina pectoris ([place]) 11/05/2017  . Left main coronary artery disease 11/05/2017  . Hyperlipidemia with target LDL less than 70 11/05/2017  . Type 2 diabetes mellitus with complication, without long-term current use of insulin ([place]) 11/05/2017  . Moderate mitral  regurgitation by prior echocardiogram 11/05/2017  . Pressure injury of skin 11/04/2017  . Acute ST elevation myocardial infarction (STEMI) of inferior wall ([place]) 11/03/2017  . Precordial pain 05/07/2012    Past Surgical History:  Procedure Laterality Date  . CORONARY STENT INTERVENTION N/A 11/03/2017   Procedure: CORONARY STENT INTERVENTION;  Surgeon: Lorretta Harp, MD;  Location: [place] [facility];  Service: Cardiovascular;  Laterality: N/A;  . CORONARY/GRAFT ACUTE MI REVASCULARIZATION N/A 11/03/2017   Procedure: Coronary/Graft Acute MI Revascularization;  Surgeon: Lorretta Harp, MD;  Location: [place] [facility];  Service: Cardiovascular;  Laterality: N/A;  . LEFT HEART CATH AND CORONARY ANGIOGRAPHY N/A 11/03/2017   Procedure: LEFT HEART CATH AND CORONARY ANGIOGRAPHY;  Surgeon: Lorretta Harp, MD;  Location: [place] [facility];  Service: Cardiovascular;  Laterality: N/A;  . STOMACH SURGERY     Treatment of an abscess        Home Medications    Prior to Admission medications   Medication Sig Start Date End Date Taking? Authorizing Provider  aspirin EC 81 MG EC tablet Take 1 tablet (81 mg total) by mouth daily. 11/09/17   Cheryln Manly, NP  atorvastatin (LIPITOR) 80 MG tablet TAKE 1 TABLET BY MOUTH DAILY AT 6 PM. 03/12/18   Strader, [place] M, PA-C  carvedilol (COREG) 3.125 MG tablet Take 1 tablet (3.125 mg total) by mouth 2 (two) times daily with a meal. 03/12/18   Strader, [place] M, PA-C  clopidogrel (PLAVIX) 75 MG tablet Take 1 tablet (75  mg total) by mouth daily. 11/23/17   Strader, Fransisco Hertz, PA-C  finasteride (PROSCAR) 5 MG tablet Take 5 mg by mouth daily.    [provider]  levothyroxine (SYNTHROID, LEVOTHROID) 50 MCG tablet Take 50 mcg by mouth daily.  02/29/12   [provider]  nitroGLYCERIN (NITROSTAT) 0.4 MG SL tablet Place 1 tablet (0.4 mg total) under the tongue every 5 (five) minutes x 3 doses as needed for chest pain. 11/08/17   Cheryln Manly, NP  pantoprazole (PROTONIX) 40 MG tablet Take 40 mg by mouth 2 (two) times daily.    [provider]  tamsulosin (FLOMAX) 0.4 MG CAPS capsule Take 0.4 mg by mouth daily.     [provider]    Family History Family History  Problem Relation Age of Onset  . Heart failure Father 85  . Parkinsonism Mother 48    Social History Social History   Tobacco Use  . Smoking status: Former Smoker    Packs/day: 1.00    Years: 40.00    Pack years: 40.00    Types: Cigarettes    Last attempt to quit: 06/26/1982    Years since quitting: 35.7  . Smokeless tobacco: Never Used  Substance Use Topics  . Alcohol use: No  . Drug use: No     Allergies   Patient has no known allergies.   Review of Systems Review of Systems  Constitutional: Negative for fever.  Respiratory: Positive for shortness of breath. Negative for cough and sputum production.   Cardiovascular: Positive for leg swelling. Negative for chest pain.  All other systems reviewed and are negative.    Physical Exam Updated Vital Signs There were no vitals taken for this visit.  Physical Exam  Constitutional: He is oriented to person, place, and time. He appears well-developed and well-nourished. No distress.  HENT:  Head: Normocephalic and atraumatic.  Mouth/Throat: Oropharynx is clear and moist.  Neck: Normal range of motion. Neck supple.  Cardiovascular: Normal rate and regular rhythm. Exam reveals no friction rub.  No murmur heard. Pulmonary/Chest: Effort normal. No respiratory distress. He has no wheezes. He has rales.  There are rales in the bases bilaterally.  Abdominal: Soft. Bowel sounds are normal. He exhibits no distension. There is no tenderness.  Musculoskeletal: Normal range of motion. He exhibits edema.  There is 2+ pitting edema of both lower extremities.  Neurological: He is alert and oriented to person, place, and time. Coordination normal.  Skin: Skin is warm and dry. He is  not diaphoretic.  Nursing note and vitals reviewed.    ED Treatments / Results  Labs (all labs ordered are listed, but only abnormal results are displayed) Labs Reviewed  BASIC METABOLIC PANEL  CBC WITH DIFFERENTIAL/PLATELET  BRAIN NATRIURETIC PEPTIDE  I-STAT TROPONIN, ED    EKG EKG Interpretation  Date/Time:  Sunday March 31 2018 03:38:43 EDT Ventricular Rate:  106 PR Interval:    QRS Duration: 145 QT Interval:  407 QTC Calculation: 541 R Axis:   -69 Text Interpretation:  Sinus tachycardia IVCD, consider atypical RBBB Confirmed by Veryl Speak [phone]) on 03/31/2018 3:50:35 AM   Radiology No results found.  Procedures Procedures (including critical care time)  Medications Ordered in ED Medications  furosemide (LASIX) injection 40 mg (has no administration in time range)  nitroGLYCERIN (NITROGLYN) 2 % ointment 1 inch (has no administration in time range)     Initial Impression / Assessment and Plan / ED Course  I have reviewed the  triage vital signs and the nursing notes.  Pertinent labs & imaging results that were available during my care of the patient were reviewed by me and considered in my medical decision making (see chart for details).  Patient presenting with shortness of breath that appears to be related to congestive heart failure.  He has an elevated BNP and findings on his chest x-ray that are consistent with pulmonary edema.  He was given intravenous Lasix along with nitroglycerin paste with good results.  He now appears much more comfortable.  His EKG is unchanged and troponin is negative.  He will be admitted to the hospitalist service for further treatment and work-up.  Dr. Alcario Drought agrees to admit.  CRITICAL CARE Performed by: Veryl Speak Total critical care time: 35 minutes Critical care time was exclusive of separately billable procedures and treating other patients. Critical care was necessary to treat or prevent imminent or life-threatening  deterioration. Critical care was time spent personally by me on the following activities: development of treatment plan with patient and/or surrogate as well as nursing, discussions with consultants, evaluation of patient's response to treatment, examination of patient, obtaining history from patient or surrogate, ordering and performing treatments and interventions, ordering and review of laboratory studies, ordering and review of radiographic studies, pulse oximetry and re-evaluation of patient's condition.   Final Clinical Impressions(s) / ED Diagnoses   Final diagnoses:  None    ED Discharge Orders    None       Veryl Speak, MD 03/31/18 1595    Veryl Speak, MD 03/31/18 [phone]

## 2018-04-01 ENCOUNTER — Inpatient Hospital Stay (HOSPITAL_COMMUNITY): Payer: Medicare Other

## 2018-04-01 ENCOUNTER — Other Ambulatory Visit (HOSPITAL_COMMUNITY): Payer: Medicare Other

## 2018-04-01 DIAGNOSIS — I5030 Unspecified diastolic (congestive) heart failure: Secondary | ICD-10-CM

## 2018-04-01 DIAGNOSIS — I509 Heart failure, unspecified: Secondary | ICD-10-CM

## 2018-04-01 LAB — COMPREHENSIVE METABOLIC PANEL
ALK PHOS: 52 U/L (ref 38–126)
ALT: 17 U/L (ref 0–44)
AST: 21 U/L (ref 15–41)
Albumin: 3.2 g/dL — ABNORMAL LOW (ref 3.5–5.0)
Anion gap: 9 (ref 5–15)
BUN: 45 mg/dL — AB (ref 8–23)
CALCIUM: 8.8 mg/dL — AB (ref 8.9–10.3)
CO2: 22 mmol/L (ref 22–32)
CREATININE: 2.28 mg/dL — AB (ref 0.61–1.24)
Chloride: 105 mmol/L (ref 98–111)
GFR, EST AFRICAN AMERICAN: 27 mL/min — AB (ref >60)
GFR, EST NON AFRICAN AMERICAN: 23 mL/min — AB (ref >60)
Glucose, Bld: 173 mg/dL — ABNORMAL HIGH (ref 70–99)
Potassium: 4.9 mmol/L (ref 3.5–5.1)
Sodium: 136 mmol/L (ref 135–145)
Total Bilirubin: 0.6 mg/dL (ref 0.3–1.2)
Total Protein: 6.7 g/dL (ref 6.5–8.1)

## 2018-04-01 LAB — ECHOCARDIOGRAM COMPLETE
HEIGHTINCHES: 70 in
Weight: 2612.8 oz

## 2018-04-01 LAB — CBC WITH DIFFERENTIAL/PLATELET
ABS IMMATURE GRANULOCYTES: 0.1 10*3/uL (ref 0.0–0.1)
Basophils Absolute: 0 10*3/uL (ref 0.0–0.1)
Basophils Relative: 0 %
Eosinophils Absolute: 0 10*3/uL (ref 0.0–0.7)
Eosinophils Relative: 0 %
HCT: 25.7 % — ABNORMAL LOW (ref 39.0–52.0)
HEMOGLOBIN: 8.2 g/dL — AB (ref 13.0–17.0)
Immature Granulocytes: 1 %
LYMPHS PCT: 10 %
Lymphs Abs: 1.1 10*3/uL (ref 0.7–4.0)
MCH: 32.4 pg (ref 26.0–34.0)
MCHC: 31.9 g/dL (ref 30.0–36.0)
MCV: 101.6 fL — AB (ref 78.0–100.0)
MONO ABS: 1 10*3/uL (ref 0.1–1.0)
MONOS PCT: 10 %
NEUTROS ABS: 8 10*3/uL — AB (ref 1.7–7.7)
Neutrophils Relative %: 79 %
Platelets: 204 10*3/uL (ref 150–400)
RBC: 2.53 MIL/uL — ABNORMAL LOW (ref 4.22–5.81)
RDW: 15.7 % — ABNORMAL HIGH (ref 11.5–15.5)
WBC: 10.2 10*3/uL (ref 4.0–10.5)

## 2018-04-01 LAB — URINALYSIS, ROUTINE W REFLEX MICROSCOPIC
Bilirubin Urine: NEGATIVE
GLUCOSE, UA: NEGATIVE mg/dL
Ketones, ur: NEGATIVE mg/dL
NITRITE: NEGATIVE
PH: 5 (ref 5.0–8.0)
PROTEIN: 100 mg/dL — AB
RBC / HPF: >50 RBC/hpf — ABNORMAL HIGH (ref 0–5)
Specific Gravity, Urine: 1.013 (ref 1.005–1.030)
WBC, UA: >50 WBC/hpf — ABNORMAL HIGH (ref 0–5)

## 2018-04-01 LAB — FERRITIN: Ferritin: 155 ng/mL (ref 24–336)

## 2018-04-01 LAB — IRON AND TIBC
Iron: 47 ug/dL (ref 45–182)
SATURATION RATIOS: 19 % (ref 17.9–39.5)
TIBC: 246 ug/dL — ABNORMAL LOW (ref 250–450)
UIBC: 199 ug/dL

## 2018-04-01 LAB — VITAMIN B12: Vitamin B-12: 435 pg/mL (ref 180–914)

## 2018-04-01 LAB — RETICULOCYTES
RBC.: 2.6 MIL/uL — ABNORMAL LOW (ref 4.22–5.81)
RETIC CT PCT: 2.9 % (ref 0.4–3.1)
Retic Count, Absolute: 75.4 10*3/uL (ref 19.0–186.0)

## 2018-04-01 LAB — SAVE SMEAR

## 2018-04-01 MED ORDER — ENSURE ENLIVE PO LIQD
237.0000 mL | Freq: Two times a day (BID) | ORAL | Status: DC
  Administered 2018-04-01 – 2018-04-05 (×2): 237 mL via ORAL

## 2018-04-01 MED ORDER — SODIUM CHLORIDE 0.9 % IV SOLN
1.0000 g | INTRAVENOUS | Status: DC
  Administered 2018-04-01 – 2018-04-03 (×3): 1 g via INTRAVENOUS
  Filled 2018-04-01 (×3): qty 10

## 2018-04-01 NOTE — Evaluation (Signed)
Physical Therapy Evaluation Patient Details Name: Randy Fitzpatrick. MRN: [id number] DOB: [birth year] Today's Date: 04/01/2018   History of Present Illness  Patient is a 84 y/o male presenting to the ED on 03/31/18 with primary complaints of SOB. Past medical history significant for hypertension, type 2 diabetes mellitus, hyperlipidemia, coronary artery disease S/P MI STEMI with stent placement about 5 months ago. Admitted for acute on chronic CHF work-up.     Clinical Impression  Randy Fitzpatrick is a very pleasant 84 y.o male admitted with the above listed diagnosis. Patient reports that prior to admission he was Mod I with all mobility and lived independently. Patient today requiring general min guard for all bed mobility, transfers, and gait for safety with verbal cueing for safety and sequencing. PT to follow acutely to maximize safe and independent functional mobility. May need to call family members to assess availability for assistance at discharge.     Follow Up Recommendations Home health PT;Supervision/Assistance - 24 hour(may initially require 24 hr sup depending on progress)    Equipment Recommendations  None recommended by PT    Recommendations for Other Services       Precautions / Restrictions Precautions Precautions: Fall Restrictions Weight Bearing Restrictions: No      Mobility  Bed Mobility Overal bed mobility: Needs Assistance Bed Mobility: Supine to Sit     Supine to sit: Supervision     General bed mobility comments: increased time and effort  Transfers Overall transfer level: Needs assistance Equipment used: Rolling walker (2 wheeled) Transfers: Sit to/from Stand Sit to Stand: Min guard;From elevated surface         General transfer comment: min guard for safety and immediate standing balance  Ambulation/Gait Ambulation/Gait assistance: Min guard Gait Distance (Feet): 150 Feet Assistive device: Rolling walker (2 wheeled) Gait Pattern/deviations:  Step-through pattern;Decreased stride length;Trunk flexed Gait velocity: decreased   General Gait Details: slow pace of gait; use of RW for stability; no LOB   Stairs            Wheelchair Mobility    Modified Rankin (Stroke Patients Only)       Balance Overall balance assessment: Needs assistance Sitting-balance support: No upper extremity supported;Feet supported Sitting balance-Leahy Scale: Fair     Standing balance support: Bilateral upper extremity supported;During functional activity Standing balance-Leahy Scale: Poor Standing balance comment: reliant on UE at RW                             Pertinent Vitals/Pain Pain Assessment: No/denies pain    Home Living Family/patient expects to be discharged to:: Private residence Living Arrangements: Alone Available Help at Discharge: Family;Friend(s);Available PRN/intermittently Type of Home: House Home Access: Stairs to enter Entrance Stairs-Rails: None Entrance Stairs-Number of Steps: 2 Home Layout: One level Home Equipment: Cane - single point;Walker - 2 wheels      Prior Function Level of Independence: Independent         Comments: reports he does his own grocery shopping and driving     Hand Dominance   Dominant Hand: Right    Extremity/Trunk Assessment   Upper Extremity Assessment Upper Extremity Assessment: Defer to OT evaluation    Lower Extremity Assessment Lower Extremity Assessment: Generalized weakness    Cervical / Trunk Assessment Cervical / Trunk Assessment: Kyphotic  Communication   Communication: HOH  Cognition Arousal/Alertness: Awake/alert Behavior During Therapy: WFL for tasks assessed/performed Overall Cognitive Status: Within Functional Limits for tasks  assessed                                        General Comments      Exercises     Assessment/Plan    PT Assessment Patient needs continued PT services  PT Problem List Decreased  strength;Decreased activity tolerance;Decreased balance;Decreased mobility;Decreased safety awareness       PT Treatment Interventions      PT Goals (Current goals can be found in the Care Plan section)  Acute Rehab PT Goals Patient Stated Goal: none stated PT Goal Formulation: With patient Time For Goal Achievement: 04/15/18 Potential to Achieve Goals: Good    Frequency Min 3X/week   Barriers to discharge        Co-evaluation               AM-PAC PT "6 Clicks" Daily Activity  Outcome Measure Difficulty turning over in bed (including adjusting bedclothes, sheets and blankets)?: A Lot Difficulty moving from lying on back to sitting on the side of the bed? : A Lot Difficulty sitting down on and standing up from a chair with arms (e.g., wheelchair, bedside commode, etc,.)?: Unable Help needed moving to and from a bed to chair (including a wheelchair)?: A Little Help needed walking in hospital room?: A Little Help needed climbing 3-5 steps with a railing? : A Lot 6 Click Score: 13    End of Session Equipment Utilized During Treatment: Gait belt Activity Tolerance: Patient tolerated treatment well Patient left: in chair;with call bell/phone within reach;with chair alarm set Nurse Communication: Mobility status PT Visit Diagnosis: Unsteadiness on feet (R26.81);Other abnormalities of gait and mobility (R26.89);Muscle weakness (generalized) (M62.81)    Time: 1657-9038 PT Time Calculation (min) (ACUTE ONLY): 33 min   Charges:   PT Evaluation $PT Eval Moderate Complexity: 1 Mod PT Treatments $Gait Training: 8-22 mins        Randy Fitzpatrick, PT, DPT Supplemental Physical Therapist 04/01/18 3:32 PM Pager: [phone] Office: [phone]

## 2018-04-01 NOTE — Progress Notes (Signed)
Initial Nutrition Assessment  DOCUMENTATION CODES:   Not applicable  INTERVENTION:    Recommend liberalizing diet to 2 gm sodium with fluid restriction; renal restrictions (low potassium, low phosphorus) not needed.  Ensure Enlive po BID, each supplement provides 350 kcal and 20 grams of protein  NUTRITION DIAGNOSIS:   Increased nutrient needs related to chronic illness(CHF) as evidenced by estimated needs.  GOAL:   Patient will meet greater than or equal to 90% of their needs  MONITOR:   PO intake, Supplement acceptance  REASON FOR ASSESSMENT:   Consult (new onset heart failure)  ASSESSMENT:   84 yo male with PMH of BPH, STEMI May 2019, CAD, DM-2, HLD, hypothyroidism who was admitted on 10/6 with pulmonary edema associated with acute CHF, UTI.   Patient reports eating 3 meals a day plus snacks between meals at home. He lives alone. Since his wife passed away, he has not eaten as regularly as he used to, but he still eats well. He does not like the hospital food, but says he is "eating enough to keep from being hungry."  Patient is very [place], even with hearing aid.  Provided him with "Heart Failure Nutrition Therapy for the Undernourished" handout from the [employer]. Patient appreciative of education handout. Unable to provide verbal education because patient is so HOH.   Labs reviewed. BUN 45 (H), Creatinine 2.28 (H) Medications reviewed and include Lasix and Flomax.   Per review of weight encounters, patient has lost 5% of usual weight within the past 3 months, not significant for the time frame.  I/O -4.9 L since admission, weight trending down with diuresis.   NUTRITION - FOCUSED PHYSICAL EXAM:    Most Recent Value  Orbital Region  No depletion  Upper Arm Region  No depletion  Thoracic and Lumbar Region  No depletion  Buccal Region  No depletion  Temple Region  No depletion  Clavicle Bone Region  No depletion  Clavicle and Acromion  Bone Region  No depletion  Scapular Bone Region  No depletion  Dorsal Hand  Mild depletion  Patellar Region  No depletion  Anterior Thigh Region  Mild depletion  Posterior Calf Region  Mild depletion  Edema (RD Assessment)  None  Hair  Reviewed  Eyes  Reviewed  Mouth  Reviewed  Skin  Reviewed  Nails  Reviewed       Diet Order:   Diet Order            Diet 2 gram sodium Room service appropriate? Yes; Fluid consistency: Thin  Diet effective now              EDUCATION NEEDS:   Education needs have been addressed  Skin:  Skin Assessment: Skin Integrity Issues: Skin Integrity Issues:: DTI DTI: buttocks  Last BM:  10/5  Height:   Ht Readings from Last 1 Encounters:  03/31/18 5\' 10"  (1.778 m)    Weight:   Wt Readings from Last 1 Encounters:  04/01/18 74.1 kg    Ideal Body Weight:  75.5 kg  BMI:  Body mass index is 23.43 kg/m.  Estimated Nutritional Needs:   Kcal:  1800-2000  Protein:  90-110 gm  Fluid:  1.8 L    Molli Barrows, RD, LDN, CNSC Pager [phone] After Hours Pager [phone]

## 2018-04-01 NOTE — Progress Notes (Signed)
Echocardiogram 2D Echocardiogram has been performed.  04/01/2018, 2:42 PM

## 2018-04-01 NOTE — Progress Notes (Signed)
Notified [facility] on-call with UA results

## 2018-04-01 NOTE — Progress Notes (Addendum)
PROGRESS NOTE  Randy Fitzpatrick. ENI:[id number] DOB: [birth year] DOA: 03/31/2018 PCP: Ranae Palms, MD  HPI/Recap of past 24 hours:  Randy Fitzpatrick. is a 84 y.o. male with medical history significant for hypertension, type 2 diabetes mellitus, hyperlipidemia, coronary artery disease S/P MI STEMI with stent placement about 5 months ago he presents with ongoing shortness of breath for 3 days but he became worse the evening of presentation to the emergency room he denies any chest pain fever chills or productive cough he reports some swelling in his feet.  His troponin was slightly elevated it was 0.03 he is slightly anemic with hemoglobin of 13 9 and 8.6 his ejection fraction by a previous echocardiogram is 45% BNP 650. Patient was given IV Lasix.  04/01/2018: Patient seen and examined at bedside.  Very hard of hearing even with his hearing aid in place in the left ear.  Admits to burning sometimes when he urinates.  Positive UA.  Urine culture ordered.  Started on IV antibiotics empirically for UTI.  States breathing is improving this morning.  Denies chest pain or palpitations.  Assessment/Plan: Principal Problem:   Acute CHF (congestive heart failure) (HCC) Active Problems:   Coronary artery disease involving native coronary artery of native heart with unstable angina pectoris (HCC)   Left main coronary artery disease   Hyperlipidemia with target LDL less than 70   Type 2 diabetes mellitus with complication, without long-term current use of insulin (HCC)   Moderate mitral regurgitation by prior echocardiogram   Acute renal failure superimposed on stage 3 chronic kidney disease (HCC)   Essential hypertension   Hypothyroidism (acquired)   GERD (gastroesophageal reflux disease)   Congestive heart failure (CHF) (HCC)   Acute systolic CHF exacerbation BNP on presentation over 600 Started on diuretic Monitor urine output Strict I's and O's and daily weight Salt restriction Last 2D  echo revealed EF of 45%  Acute pulmonary edema suspect secondary to acute systolic CHF exacerbation On diuretic Monitor urine output Monitor renal function O2 supplementation to maintain O2 saturation 92% or greater  AKI on CKD 3 Suspect prerenal from dehydration Baseline creatinine 1.6 with GFR 35 Creatinine worsening at 2.1 with GFR of 23 Avoid nephrotoxic agents/dehydration/hypotension Obtain renal US  UTI Positive urine analysis Urine cultures ordered Started IV Rocephin empirically Narrow down antibiotics once results of sensitivities return Monitor fever curve and WBC Obtain CBC in the morning  Chronic macrocytic anemia Baseline hg 11 Hg down to 8.2 Obtain FOBT Obtain anemia workup  Hematuria with foley cath Clinically undetermined unclear source of hematuria  Monitor urine output Renal U/S to assess AKI  Uncontrolled hypertension Blood pressure medications include Coreg 3.125 mg twice daily, Lasix IV 40 mg twice daily Also tamsulosin 0.4 mg daily  History of ST elevation MI of inferior wall Continue aspirin, Plavix, Lipitor, and Coreg  Hypothyroidism Continue levothyroxine  GERD Continue Protonix  Hyperlipidemia Continue Lipitor  BPH On tamsulosin Foley placed in 03/31/18 Consider removing foley cath in 48 hours if appropriate  Risks: High risk for decompensation due to acute systolic CHF exacerbation, UTI, multiple comorbidities, and advanced age.   Code Status: Full code  Family Communication: None at bedside  Disposition Plan: Home when clinically stable possibly in 1 to 2 days   Consultants:  None  Admitting physician curb sided with cardiology  Procedures: None  Antimicrobials:  Rocephin  DVT prophylaxis: Subcu Lovenox daily   Objective: Vitals:   03/31/18 1512 03/31/18 1709 03/31/18  2222 04/01/18 0411  BP: 137/62 (!) 149/113 (!) 146/56 (!) 131/53  Pulse: 66 74 68 67  Resp:   17 17  Temp: 98.3 F (36.8 C)  98.1 F  (36.7 C) 97.6 F (36.4 C)  TempSrc: Oral  Oral Oral  SpO2: 100%  98% 98%  Weight:    74.1 kg  Height:        Intake/Output Summary (Last 24 hours) at 04/01/2018 1350 Last data filed at 04/01/2018 1053 Gross per 24 hour  Intake -  Output 2600 ml  Net -2600 ml   Filed Weights   03/31/18 0401 03/31/18 1105 04/01/18 0411  Weight: 78.9 kg 75 kg 74.1 kg    Exam:  . General: 84 y.o. year-old male well developed well nourished in no acute distress.  Alert and interactive.  Very hard of hearing. . Cardiovascular: Regular rate and rhythm with no rubs or gallops.  No thyromegaly or JVD noted.   Marland Kitchen Respiratory: Mild rales at bases with no wheezes.. Good inspiratory effort. . Abdomen: Soft nontender nondistended with normal bowel sounds x4 quadrants. . Musculoskeletal: 1+ pitting edema in lower extremity bilaterally. 2/4 pulses in all 4 extremities. Marland Kitchen Psychiatry: Mood is appropriate for condition and setting   Data Reviewed: CBC: Recent Labs  Lab 03/31/18 0349 03/31/18 1029 04/01/18 0442  WBC 13.9* 9.6 10.2  NEUTROABS 10.0*  --  8.0*  HGB 9.0* 8.6* 8.2*  HCT 29.4* 27.4* 25.7*  MCV 107.3* 102.2* 101.6*  PLT 203 177 696   Basic Metabolic Panel: Recent Labs  Lab 03/31/18 0349 03/31/18 1029 04/01/18 0442  NA 138  --  136  K 5.6*  --  4.9  CL 110  --  105  CO2 19*  --  22  GLUCOSE 212*  --  173*  BUN 34*  --  45*  CREATININE 2.15* 2.10* 2.28*  CALCIUM 8.9  --  8.8*   GFR: Estimated Creatinine Clearance: 21.8 mL/min (A) (by C-G formula based on SCr of 2.28 mg/dL (H)). Liver Function Tests: Recent Labs  Lab 04/01/18 0442  AST 21  ALT 17  ALKPHOS 52  BILITOT 0.6  PROT 6.7  ALBUMIN 3.2*   No results for input(s): LIPASE, AMYLASE in the last 168 hours. No results for input(s): AMMONIA in the last 168 hours. Coagulation Profile: No results for input(s): INR, PROTIME in the last 168 hours. Cardiac Enzymes: No results for input(s): CKTOTAL, CKMB, CKMBINDEX, TROPONINI  in the last 168 hours. BNP (last 3 results) No results for input(s): PROBNP in the last 8760 hours. HbA1C: No results for input(s): HGBA1C in the last 72 hours. CBG: No results for input(s): GLUCAP in the last 168 hours. Lipid Profile: No results for input(s): CHOL, HDL, LDLCALC, TRIG, CHOLHDL, LDLDIRECT in the last 72 hours. Thyroid Function Tests: No results for input(s): TSH, T4TOTAL, FREET4, T3FREE, THYROIDAB in the last 72 hours. Anemia Panel: No results for input(s): VITAMINB12, FOLATE, FERRITIN, TIBC, IRON, RETICCTPCT in the last 72 hours. Urine analysis:    Component Value Date/Time   COLORURINE YELLOW 04/01/2018 0252   APPEARANCEUR CLOUDY (A) 04/01/2018 0252   LABSPEC 1.013 04/01/2018 0252   PHURINE 5.0 04/01/2018 0252   GLUCOSEU NEGATIVE 04/01/2018 0252   HGBUR LARGE (A) 04/01/2018 0252   BILIRUBINUR NEGATIVE 04/01/2018 0252   KETONESUR NEGATIVE 04/01/2018 0252   PROTEINUR 100 (A) 04/01/2018 0252   NITRITE NEGATIVE 04/01/2018 0252   LEUKOCYTESUR SMALL (A) 04/01/2018 0252   Sepsis Labs: @LABRCNTIP (procalcitonin:4,lacticidven:4)  )No results found for  this or any previous visit (from the past 240 hour(s)).    Studies: Dg Chest 2 View  Result Date: 04/01/2018 CLINICAL DATA:  84 year old male congestive heart failure. Subsequent encounter. EXAM: CHEST - 2 VIEW COMPARISON:  03/31/2018 chest x-ray. FINDINGS: Mild pulmonary edema with small pleural effusions. Left-sided pleural effusion slightly smaller. Opacity right middle lobe may represent pleural fluid or atelectasis, cannot entirely exclude subtle infiltrate. Mild cardiomegaly. Coronary artery calcifications. Calcified aorta. No acute osseous abnormality. IMPRESSION: 1. Mild pulmonary edema with small pleural effusions. Left pleural effusion slightly smaller. 2. Right middle lobe opacity may represent pleural fluid or atelectasis, cannot entirely exclude a subtle infiltrate. 3. Mild cardiomegaly with coronary artery  calcifications. 4.  Aortic Atherosclerosis (ICD10-I70.0). Electronically Signed   By: Genia Del M.D.   On: 04/01/2018 07:02    Scheduled Meds: . aspirin EC  81 mg Oral Daily  . atorvastatin  80 mg Oral q1800  . carvedilol  3.125 mg Oral BID WC  . clopidogrel  75 mg Oral Daily  . enoxaparin (LOVENOX) injection  30 mg Subcutaneous Daily  . finasteride  5 mg Oral Daily  . furosemide  40 mg Intravenous BID  . levothyroxine  50 mcg Oral QAC breakfast  . pantoprazole  40 mg Oral Daily  . sodium chloride flush  3 mL Intravenous Q12H  . tamsulosin  0.4 mg Oral Daily    Continuous Infusions: . sodium chloride 250 mL (04/01/18 1259)  . cefTRIAXone (ROCEPHIN)  IV 1 g (04/01/18 1301)     LOS: 1 day     Kayleen Memos, MD [facility] Pager [phone]  If 7PM-7AM, please contact night-coverage [email or web address] Password [email or web address] 04/01/2018, 1:50 PM

## 2018-04-01 NOTE — Progress Notes (Signed)
Pt had foley placed in ED- documented that urine was clear/yellow now it has become amber/red with sediment that appears to be blood. Paged [facility] on-call. Order placed for UA

## 2018-04-02 ENCOUNTER — Inpatient Hospital Stay (HOSPITAL_COMMUNITY): Payer: Medicare Other

## 2018-04-02 DIAGNOSIS — N170 Acute kidney failure with tubular necrosis: Secondary | ICD-10-CM

## 2018-04-02 DIAGNOSIS — I10 Essential (primary) hypertension: Secondary | ICD-10-CM

## 2018-04-02 DIAGNOSIS — N183 Chronic kidney disease, stage 3 (moderate): Secondary | ICD-10-CM

## 2018-04-02 DIAGNOSIS — I5021 Acute systolic (congestive) heart failure: Secondary | ICD-10-CM

## 2018-04-02 DIAGNOSIS — I25110 Atherosclerotic heart disease of native coronary artery with unstable angina pectoris: Secondary | ICD-10-CM

## 2018-04-02 LAB — BASIC METABOLIC PANEL
Anion gap: 9 (ref 5–15)
BUN: 54 mg/dL — AB (ref 8–23)
CO2: 24 mmol/L (ref 22–32)
CREATININE: 2.35 mg/dL — AB (ref 0.61–1.24)
Calcium: 8.7 mg/dL — ABNORMAL LOW (ref 8.9–10.3)
Chloride: 105 mmol/L (ref 98–111)
GFR calc Af Amer: 26 mL/min — ABNORMAL LOW (ref >60)
GFR, EST NON AFRICAN AMERICAN: 23 mL/min — AB (ref >60)
GLUCOSE: 109 mg/dL — AB (ref 70–99)
POTASSIUM: 4.6 mmol/L (ref 3.5–5.1)
SODIUM: 138 mmol/L (ref 135–145)

## 2018-04-02 LAB — CBC
HEMATOCRIT: 25.2 % — AB (ref 39.0–52.0)
Hemoglobin: 8.2 g/dL — ABNORMAL LOW (ref 13.0–17.0)
MCH: 32.9 pg (ref 26.0–34.0)
MCHC: 32.5 g/dL (ref 30.0–36.0)
MCV: 101.2 fL — AB (ref 80.0–100.0)
PLATELETS: 206 10*3/uL (ref 150–400)
RBC: 2.49 MIL/uL — ABNORMAL LOW (ref 4.22–5.81)
RDW: 15.9 % — AB (ref 11.5–15.5)
WBC: 9.3 10*3/uL (ref 4.0–10.5)

## 2018-04-02 LAB — FOLATE RBC
FOLATE, HEMOLYSATE: 315 ng/mL
Folate, RBC: 1245 ng/mL (ref >498)
Hematocrit: 25.3 % — ABNORMAL LOW (ref 37.5–51.0)

## 2018-04-02 MED ORDER — FERROUS SULFATE 325 (65 FE) MG PO TABS
325.0000 mg | ORAL_TABLET | Freq: Every day | ORAL | Status: DC
  Administered 2018-04-02 – 2018-04-04 (×3): 325 mg via ORAL
  Filled 2018-04-02 (×3): qty 1

## 2018-04-02 NOTE — Care Management Note (Addendum)
Case Management Note  Patient Details  Name: Randy Fitzpatrick. MRN: [id number] Date of Birth: 29-Apr-1925  Subjective/Objective:  Pt presented for SOB- Acute CHF. PTA from home alone. Patients wife is at [facility]. Pt states he still drives and visits wife at [facility]. Pt gets medications @ [facility]- compliant with medications. Pt has PCP Katheren Puller. PT recommendations for [facility] with 24 hr supervision.                Action/Plan: CM did speak with daughter Randy Fitzpatrick @ [phone] in regards to plan of care for her father. Randy Fitzpatrick is the POA for [facility]. Per Randy Fitzpatrick, patient has utilized [facility] in the past for personal care services. [facility] was willing to contact them again for services, however, Randy Fitzpatrick is refusing to allow them to come back in. CM did ask about another [facility] care agency and patient is declining services- stating he will not need. CM did ask him about [facility] for PT -he utilized [facility] in the past and would like to utilize again. Pt declined [facility]. CM will fax referral to Randy Fitz[facility] at [facility] to see if they can service the patient. CM did page MD for [facility] and F2F. Awaiting call back to see if patient will transition home today vs tomorrow. Patient will need transportation home. Daughter resides in [place]. CM will continue to monitor.   Expected Discharge Date:                  Expected Discharge Plan:  Randy [facility]  In-House Referral:  NA  Discharge planning Services  CM Consult  Post Acute Care Choice:  Home Health Choice offered to:  Patient  DME Arranged:  N/A DME Agency:  NA  HH Arranged:  PT HH Agency:  [facility]  Status of Service:  Completed, signed off  If discussed at Long Length of Stay Meetings, dates discussed:    Additional Comments: 1024 04-05-18 Randy Krauss, RN,BSN [id number] Daughter aware of transition home. CM will  fax d/c summary to [facility]. No further needs from CM at this time.    [id number] Randy Krauss, RN,BSN [phone] Case Manager did speak with daughter in regards to disposition. Patient will stay overnight Cr elevated-plan IVF and reevaluate in am in regards to home with [facility]. CM will continue to monitor.    Randy Fitzpatrick 04-02-18 Randy Krauss, RN,BSN [phone] [facility] can provide [facility]. CM will continue to monitor for additional needs.  Randy Roys, RN 04/02/2018, 12:13 PM

## 2018-04-02 NOTE — Consult Note (Signed)
Cardiology Consultation:   Patient ID: Randy Fitzpatrick. MRN: [id number]; DOB: 12-Jun-1925  Admit date: 03/31/2018 Date of Consult: 04/02/2018  Primary Care Provider: Ranae Palms, MD Primary Cardiologist: [name], MD  Primary Electrophysiologist:  None    Patient Profile:   Randy Fitzpatrick. is a 84 y.o. 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 Nevada Crane.  History of Present Illness:   Randy Fitzpatrick 84 year old male with prior ST elevation myocardial infarction 10/2017 with DES x3 to RCA, gated by wire dissection who presented here with shortness of breath for about 3 days.  His BNP was minimally elevated at 650.  Ultimately he has been complaining of burning with urination.  IV antibiotics were started.  Very hard of hearing.  No real complaints of shortness of breath at this time.  He was given IV Lasix originally.  His creatinine has increased significantly.  He states that he has gotten off a whole lot of moisture.  He is feeling much better than he did when he came in.  Currently sitting in bed, no labored breathing.  Denies any chest pain.  Past Medical History:  Diagnosis Date  . Acute ST elevation myocardial infarction (STEMI) of inferior wall (HCC) 11/03/2017   PCI/DESx3 to RCA after wire dissection. EF 45-50%  . BPH (benign prostatic hyperplasia)   . CAD (coronary artery disease)    Multivessel/left main disease  . Hypothyroidism   . Mitral regurgitation   . Mixed hyperlipidemia   . Type 2 diabetes mellitus ([place])     Past Surgical History:  Procedure Laterality Date  . CORONARY STENT INTERVENTION N/A 11/03/2017   Procedure: CORONARY STENT INTERVENTION;  Surgeon: Lorretta Harp, MD;  Location: [facility];  Service: Cardiovascular;  Laterality: N/A;  . CORONARY/GRAFT ACUTE MI REVASCULARIZATION N/A 11/03/2017   Procedure: Coronary/Graft  Acute MI Revascularization;  Surgeon: Lorretta Harp, MD;  Location: [place] [facility];  Service: Cardiovascular;  Laterality: N/A;  . LEFT HEART CATH AND CORONARY ANGIOGRAPHY N/A 11/03/2017   Procedure: LEFT HEART CATH AND CORONARY ANGIOGRAPHY;  Surgeon: Lorretta Harp, MD;  Location: [facility];  Service: Cardiovascular;  Laterality: N/A;  . STOMACH SURGERY     Treatment of an abscess     Home Medications:  Prior to Admission medications   Medication Sig Start Date End Date Taking? Authorizing Provider  aspirin EC 81 MG EC tablet Take 1 tablet (81 mg total) by mouth daily. 11/09/17  Yes Cheryln Manly, NP  atorvastatin (LIPITOR) 80 MG tablet TAKE 1 TABLET BY MOUTH DAILY AT 6 PM. Patient taking differently: Take 80 mg by mouth daily at 6 PM.  03/12/18  Yes Strader, [place] M, PA-C  carvedilol (COREG) 3.125 MG tablet Take 1 tablet (3.125 mg total) by mouth 2 (two) times daily with a meal. 03/12/18  Yes Strader, [place] M, PA-C  clopidogrel (PLAVIX) 75 MG tablet Take 1 tablet (75 mg total) by mouth daily. 11/23/17  Yes Strader, [place] M, PA-C  finasteride (PROSCAR) 5 MG tablet Take 5 mg by mouth daily.   Yes [provider]  levothyroxine (SYNTHROID, LEVOTHROID) 50 MCG tablet Take 50 mcg by mouth daily.  02/29/12  Yes [provider]  nitroGLYCERIN (NITROSTAT) 0.4 MG SL tablet Place 1 tablet (0.4 mg total) under the tongue every 5 (five) minutes x 3  doses as needed for chest pain. 11/08/17  Yes Reino Bellis B, NP  pantoprazole (PROTONIX) 40 MG tablet Take 40 mg by mouth 2 (two) times daily.   Yes [provider]  tamsulosin (FLOMAX) 0.4 MG CAPS capsule Take 0.4 mg by mouth daily.    Yes [provider]    Inpatient Medications: Scheduled Meds: . aspirin EC  81 mg Oral Daily  . atorvastatin  80 mg Oral q1800  . carvedilol  3.125 mg Oral BID WC  . clopidogrel  75 mg Oral Daily  . enoxaparin (LOVENOX) injection  30 mg Subcutaneous Daily    . feeding supplement (ENSURE ENLIVE)  237 mL Oral BID BM  . ferrous sulfate  325 mg Oral Q supper  . finasteride  5 mg Oral Daily  . furosemide  40 mg Intravenous BID  . levothyroxine  50 mcg Oral QAC breakfast  . pantoprazole  40 mg Oral Daily  . sodium chloride flush  3 mL Intravenous Q12H  . tamsulosin  0.4 mg Oral Daily   Continuous Infusions: . sodium chloride 250 mL (04/01/18 1259)  . cefTRIAXone (ROCEPHIN)  IV 1 g (04/02/18 1151)   PRN Meds: sodium chloride, acetaminophen, ondansetron (ZOFRAN) IV, sodium chloride flush  Allergies:   No Known Allergies  Social History:   Social History   Socioeconomic History  . Marital status: Married    Spouse name: Not on file  . Number of children: 1  . Years of education: Not on file  . Highest education level: Not on file  Occupational History  . Not on file  Social Needs  . Financial resource strain: Not on file  . Food insecurity:    Worry: Not on file    Inability: Not on file  . Transportation needs:    Medical: Not on file    Non-medical: Not on file  Tobacco Use  . Smoking status: Former Smoker    Packs/day: 1.00    Years: 40.00    Pack years: 40.00    Types: Cigarettes    Last attempt to quit: 06/26/1982    Years since quitting: 35.7  . Smokeless tobacco: Never Used  Substance and Sexual Activity  . Alcohol use: No  . Drug use: No  . Sexual activity: Not on file  Lifestyle  . Physical activity:    Days per week: Not on file    Minutes per session: Not on file  . Stress: Not on file  Relationships  . Social connections:    Talks on phone: Not on file    Gets together: Not on file    Attends religious service: Not on file    Active member of club or organization: Not on file    Attends meetings of clubs or organizations: Not on file    Relationship status: Not on file  . Intimate partner violence:    Fear of current or ex partner: Not on file    Emotionally abused: Not on file    Physically abused:  Not on file    Forced sexual activity: Not on file  Other Topics Concern  . Not on file  Social History Narrative  . Not on file    Family History:    Family History  Problem Relation Age of Onset  . Heart failure Father 41  . Parkinsonism Mother 67     ROS:  Please see the history of present illness.  No Reiger's, no melena, no headaches All other ROS reviewed  and negative.     Physical Exam/Data:   Vitals:   04/02/18 0500 04/02/18 0726 04/02/18 1232 04/02/18 1721  BP: (!) 126/50 140/63 (!) 130/58 (!) 171/65  Pulse: 65 71 71 70  Resp: 17  20   Temp: 98.1 F (36.7 C)  97.8 F (36.6 C)   TempSrc: Oral  Oral   SpO2: 94%  96%   Weight:   72.3 kg   Height:        Intake/Output Summary (Last 24 hours) at 04/02/2018 1900 Last data filed at 04/02/2018 1807 Gross per 24 hour  Intake 463 ml  Output 1825 ml  Net -1362 ml   Filed Weights   03/31/18 1105 04/01/18 0411 04/02/18 1232  Weight: 75 kg 74.1 kg 72.3 kg   Body mass index is 22.89 kg/m.  General:  Well nourished, well developed, in no acute distress elderly male, sitting comfortably in bed HEENT: normal Lymph: no adenopathy Neck: no JVD Endocrine:  No thryomegaly Vascular: No carotid bruits;  Cardiac:  normal S1, S2; RRR; soft systolic murmur apex  Lungs:  clear to auscultation bilaterally, no wheezing, rhonchi or rales  Abd: soft, nontender, no hepatomegaly  Ext: no edema Musculoskeletal:  No deformities, BUE and BLE strength normal and equal Skin: warm and dry  Neuro:  CNs 2-12 intact, no focal abnormalities noted Psych:  Normal affect   EKG:  The EKG was personally reviewed and demonstrates: 03/31/2018-sinus tachycardia 106 with right bundle branch block and nonspecific ST-T wave changes  Telemetry:  Telemetry was personally reviewed and demonstrates: Sinus rhythm interventricular conduction delay  Relevant CV Studies:   - Left ventricle: The cavity size was normal. Wall thickness was   increased  in a pattern of mild LVH. Akinesis of the inferior   wall. Inferolateral hypokinesis. The estimated ejection fraction   was 40%. Features are consistent with a pseudonormal left   ventricular filling pattern, with concomitant abnormal relaxation   and increased filling pressure (grade 2 diastolic dysfunction). - Aortic valve: There was trivial regurgitation. - Aorta: Mildly dilated ascending aorta, 3.7 cm. - Mitral valve: Moderately calcified annulus. Mildly calcified   leaflets . There was no evidence for stenosis. There was moderate   regurgitation, suspect infarct-related with   inferior/inferolateral wall motion abnormalities and restriction   of posterior leaflet. Mean gradient (D): 5 mm Hg. Valve area by   pressure half-time: 2.65 cm^2. - Left atrium: The atrium was mildly dilated. - Right ventricle: The cavity size was normal. Systolic function   was normal. - Right atrium: The atrium was mildly dilated. - Tricuspid valve: Peak RV-RA gradient (S): 44 mm Hg. - Pulmonary arteries: PA peak pressure: 52 mm Hg (S). - Systemic veins: IVC measured 2.1 cm with > 50% respirophasic   variation, suggesting RA pressure 8 mmHg. - Pericardium, extracardiac: Cystic structures noted in liver. A   trivial pericardial effusion was identified.  Impressions:  - Normal LV size with mild LV hypertrophy, EF 40%. Inferior   akinesis, inferolateral hypokinesis. Moderate diastolic   dysfunction. Normal RV size and systolic function. Moderate   mitral regurgitation, suspect infarct-related. Moderate pulmonary   hypertension.   Laboratory Data:  Chemistry Recent Labs  Lab 03/31/18 0349 03/31/18 1029 04/01/18 0442 04/02/18 0558  NA 138  --  136 138  K 5.6*  --  4.9 4.6  CL 110  --  105 105  CO2 19*  --  22 24  GLUCOSE 212*  --  173* 109*  BUN  34*  --  45* 54*  CREATININE 2.15* 2.10* 2.28* 2.35*  CALCIUM 8.9  --  8.8* 8.7*  GFRNONAA 25* 26* 23* 23*  GFRAA 29* 30* 27* 26*  ANIONGAP 9   --  9 9    Recent Labs  Lab 04/01/18 0442  PROT 6.7  ALBUMIN 3.2*  AST 21  ALT 17  ALKPHOS 52  BILITOT 0.6   Hematology Recent Labs  Lab 03/31/18 1029 04/01/18 0442 04/01/18 1446 04/02/18 0558  WBC 9.6 10.2  --  9.3  RBC 2.68* 2.53* 2.60* 2.49*  HGB 8.6* 8.2*  --  8.2*  HCT 27.4* 25.7* 25.3* 25.2*  MCV 102.2* 101.6*  --  101.2*  MCH 32.1 32.4  --  32.9  MCHC 31.4 31.9  --  32.5  RDW 15.7* 15.7*  --  15.9*  PLT 177 204  --  206   Cardiac EnzymesNo results for input(s): TROPONINI in the last 168 hours.  Recent Labs  Lab 03/31/18 0354  TROPIPOC 0.03    BNP Recent Labs  Lab 03/31/18 0411  BNP 650.5*    DDimer No results for input(s): DDIMER in the last 168 hours.  Radiology/Studies:  Dg Chest 2 View  Result Date: 04/01/2018 CLINICAL DATA:  84 year old male congestive heart failure. Subsequent encounter. EXAM: CHEST - 2 VIEW COMPARISON:  03/31/2018 chest x-ray. FINDINGS: Mild pulmonary edema with small pleural effusions. Left-sided pleural effusion slightly smaller. Opacity right middle lobe may represent pleural fluid or atelectasis, cannot entirely exclude subtle infiltrate. Mild cardiomegaly. Coronary artery calcifications. Calcified aorta. No acute osseous abnormality. IMPRESSION: 1. Mild pulmonary edema with small pleural effusions. Left pleural effusion slightly smaller. 2. Right middle lobe opacity may represent pleural fluid or atelectasis, cannot entirely exclude a subtle infiltrate. 3. Mild cardiomegaly with coronary artery calcifications. 4.  Aortic Atherosclerosis (ICD10-I70.0). Electronically Signed   By: Genia Del M.D.   On: 04/01/2018 07:02   US Renal  Result Date: 04/02/2018 CLINICAL DATA:  Acute kidney injury.  Increased BUN and creatinine. EXAM: RENAL / URINARY TRACT ULTRASOUND COMPLETE COMPARISON:  CT abdomen and pelvis 01/05/2014 FINDINGS: Right Kidney: Length: 15.1 cm. Multiple simple appearing renal cysts, similar to previous CT. No  hydronephrosis. Normal parenchymal echotexture. Left Kidney: Length: 11.2 cm. Multiple simple appearing renal cysts, similar to prior CT. No hydronephrosis. Normal parenchymal echotexture. Bladder: Bladder is decompressed with a Foley catheter. IMPRESSION: Bilateral benign-appearing renal cysts. No hydronephrosis. Foley catheter decompresses the bladder. Electronically Signed   By: Lucienne Capers M.D.   On: 04/02/2018 03:56   Dg Chest Port 1 View  Result Date: 03/31/2018 CLINICAL DATA:  84 y/o M; feet and ankles swollen. Shortness of breath. EXAM: PORTABLE CHEST 1 VIEW COMPARISON:  01/13/2018 chest radiograph. FINDINGS: Stable normal cardiac silhouette given projection and technique. Aortic atherosclerosis with calcification. Increased reticular and peripheral linear opacities of the lungs. No consolidation or pneumothorax. Possible small left effusion. No acute osseous abnormality is evident. IMPRESSION: Interstitial opacities, likely edema.  Possible small left effusion. Electronically Signed   By: Kristine Garbe M.D.   On: 03/31/2018 04:33    Assessment and Plan:   Acute on chronic systolic heart failure - EF 40 to 45% with inferolateral wall motion - Echocardiogram is essentially unchanged from prior with EF being 40% now, previously 45%.  This is a negligible change and likely represents reader variation.  When looking at both echoes concomitantly, there is no significant difference. - No further work-up necessary. - Okay with low-dose carvedilol. -Avoid  ACE inhibitor or angiotensin receptor blocker because of chronic kidney disease stage III/IV - I would continue with continued conservative.  Agree that he is probably slightly prerenal at this point from prior administration of IV Lasix 40 mill grams twice a day.  Creatinine 2.35 up from 1.6.  Stop Lasix.  I will write the order to stop his Lasix.  When his creatinine has stabilized, I would advocate for low-dose Lasix as outpatient,  20 mg once a day with close basic metabolic profile follow-up.  Mitral regurgitation -Moderate, likely associated with decreased ejection fraction, wall motion abnormality in the inferior region, infarct related.  No intervention necessary.  Coronary artery disease - Currently stable with no anginal symptoms.  Infarction noted on echocardiogram.  Continue with dual antiplatelet therapy.  Statin.  Urinary retention/UTI/worsening AKI -Per primary team.  Anemia - Per primary team, hemoglobin 8.6.  Please let us know if we can be of further assistance.  [facility] will sign off.   Medication Recommendations: Hold diuretic for now.  Once stabilized creatinine, Lasix 20 mg once a day as outpatient.  Salt and fluid restriction. Other recommendations (labs, testing, etc): Continue to monitor creatinine closely as outpatient Follow up as an outpatient: As previously scheduled  For questions or updates, please contact [facility] Please consult [email or web address] for contact info under     Signed, Candee Furbish, MD  04/02/2018 7:00 PM

## 2018-04-02 NOTE — Progress Notes (Signed)
PROGRESS NOTE  Randy Fitzpatrick. MPN:[id number] DOB: Mar 10, 1925 DOA: 03/31/2018 PCP: Ranae Palms, MD  HPI/Recap of past 24 hours:  Randy Fitzpatrick. is a 84 y.o. male with medical history significant for hypertension, type 2 diabetes mellitus, hyperlipidemia, coronary artery disease S/P MI STEMI with stent placement about 5 months ago he presents with ongoing shortness of breath for 3 days but he became worse the evening of presentation to the emergency room he denies any chest pain fever chills or productive cough he reports some swelling in his feet.  His troponin was slightly elevated it was 0.03 he is slightly anemic with hemoglobin of 13 9 and 8.6 his ejection fraction by a previous echocardiogram is 45% BNP 650. Patient was given IV Lasix.  04/01/2018: Patient seen and examined at bedside.  Very hard of hearing even with his hearing aid in place in the left ear.  Admits to burning sometimes when he urinates.  Positive UA.  Urine culture ordered.  Started on IV antibiotics empirically for UTI.  States breathing is improving this morning.  Denies chest pain or palpitations.  04/02/2018: Patient seen and examined his bedside.  No acute events overnight.  Patient is very hard of hearing.  He has no new complaints.  Denies any chest pain, palpitations or dyspnea at rest.  Assessment/Plan: Principal Problem:   Acute CHF (congestive heart failure) (HCC) Active Problems:   Coronary artery disease involving native coronary artery of native heart with unstable angina pectoris (HCC)   Left main coronary artery disease   Hyperlipidemia with target LDL less than 70   Type 2 diabetes mellitus with complication, without long-term current use of insulin (HCC)   Moderate mitral regurgitation by prior echocardiogram   Acute renal failure superimposed on stage 3 chronic kidney disease ([place])   Essential hypertension   Hypothyroidism (acquired)   GERD (gastroesophageal reflux disease)   Congestive  heart failure (CHF) (HCC)   Acute systolic CHF exacerbation BNP on presentation over 600 On diuretic Continue monitor urine output Continue strict I's and O's and daily weight Continue salt restriction Last 2D echo revealed EF of 45% Repeated 2D echo done on 04/01/2018 revealed LVEF 40% with inferolateral wall motion abnormality Cardiology consulted;  Acute pulmonary edema suspect secondary to acute systolic CHF exacerbation On diuretic Continue monitor urine output Continue monitor renal function Continue O2 supplementation to maintain O2 saturation 92% or greater  Worsening AKI on CKD 3 Suspect prerenal from dehydration Baseline creatinine 1.6 with GFR 35 Creatinine worsening at 2.35 from 2.1 with GFR of 23 Avoid nephrotoxic agents/dehydration/hypotension Renal ultrasound revealed bilateral benign appearing renal cysts.  No hydronephrosis.  Urinary retention Remove Foley catheter post 48 hours Voiding trial  UTI Positive urine analysis Urine culture in process Continue IV Rocephin empirically Narrow down antibiotics once results of sensitivities return Continue monitor fever curve and WBC  Chronic macrocytic anemia/iron deficiency anemia Baseline hg 11 Hg down to 8.2 Obtain FOBT Start ferrous sulfate 325 mg daily  Resolving hematuria with foley cath Clinically undetermined unclear source of hematuria   Uncontrolled hypertension Blood pressure medications include Coreg 3.125 mg twice daily, Lasix IV 40 mg twice daily Also tamsulosin 0.4 mg daily  History of ST elevation MI of inferior wall Continue aspirin, Plavix, Lipitor, and Coreg  Hypothyroidism Continue levothyroxine  GERD Continue Protonix  Hyperlipidemia Continue Lipitor  BPH On tamsulosin Foley placed in 03/31/18 Consider removing foley cath in 48 hours if appropriate  Risks: High risk for  decompensation due to acute systolic CHF exacerbation, UTI, multiple comorbidities, and advanced  age.   Code Status: Full code  Family Communication: None at bedside  Disposition Plan: Home when clinically stable possibly in 1 to 2 days   Consultants:  None  Admitting physician curb sided with cardiology  Procedures: None  Antimicrobials:  Rocephin  DVT prophylaxis: Subcu Lovenox daily   Objective: Vitals:   04/02/18 0500 04/02/18 0726 04/02/18 1232 04/02/18 1721  BP: (!) 126/50 140/63 (!) 130/58 (!) 171/65  Pulse: 65 71 71 70  Resp: 17  20   Temp: 98.1 F (36.7 C)  97.8 F (36.6 C)   TempSrc: Oral  Oral   SpO2: 94%  96%   Weight:   72.3 kg   Height:        Intake/Output Summary (Last 24 hours) at 04/02/2018 1727 Last data filed at 04/02/2018 1225 Gross per 24 hour  Intake 343 ml  Output 1625 ml  Net -1282 ml   Filed Weights   03/31/18 1105 04/01/18 0411 04/02/18 1232  Weight: 75 kg 74.1 kg 72.3 kg    Exam:  . General: 84 y.o. year-old male well-developed well-nourished in no acute distress.  Alert interactive.  Very hard of hearing. . Cardiovascular: Regular rate and rhythm with no rubs or gallops.  No thyromegaly or JVD noted. Marland Kitchen Respiratory: Mild rales at bases with no wheezes.. Good inspiratory effort. . Abdomen: Soft nontender nondistended with normal bowel sounds x4 quadrants. . Musculoskeletal: 1+ pitting edema in lower extremity bilaterally. 2/4 pulses in all 4 extremities. Marland Kitchen Psychiatry: Mood is appropriate for condition and setting   Data Reviewed: CBC: Recent Labs  Lab 03/31/18 0349 03/31/18 1029 04/01/18 0442 04/01/18 1446 04/02/18 0558  WBC 13.9* 9.6 10.2  --  9.3  NEUTROABS 10.0*  --  8.0*  --   --   HGB 9.0* 8.6* 8.2*  --  8.2*  HCT 29.4* 27.4* 25.7* 25.3* 25.2*  MCV 107.3* 102.2* 101.6*  --  101.2*  PLT 203 177 204  --  440   Basic Metabolic Panel: Recent Labs  Lab 03/31/18 0349 03/31/18 1029 04/01/18 0442 04/02/18 0558  NA 138  --  136 138  K 5.6*  --  4.9 4.6  CL 110  --  105 105  CO2 19*  --  22 24  GLUCOSE  212*  --  173* 109*  BUN 34*  --  45* 54*  CREATININE 2.15* 2.10* 2.28* 2.35*  CALCIUM 8.9  --  8.8* 8.7*   GFR: Estimated Creatinine Clearance: 20.9 mL/min (A) (by C-G formula based on SCr of 2.35 mg/dL (H)). Liver Function Tests: Recent Labs  Lab 04/01/18 0442  AST 21  ALT 17  ALKPHOS 52  BILITOT 0.6  PROT 6.7  ALBUMIN 3.2*   No results for input(s): LIPASE, AMYLASE in the last 168 hours. No results for input(s): AMMONIA in the last 168 hours. Coagulation Profile: No results for input(s): INR, PROTIME in the last 168 hours. Cardiac Enzymes: No results for input(s): CKTOTAL, CKMB, CKMBINDEX, TROPONINI in the last 168 hours. BNP (last 3 results) No results for input(s): PROBNP in the last 8760 hours. HbA1C: No results for input(s): HGBA1C in the last 72 hours. CBG: No results for input(s): GLUCAP in the last 168 hours. Lipid Profile: No results for input(s): CHOL, HDL, LDLCALC, TRIG, CHOLHDL, LDLDIRECT in the last 72 hours. Thyroid Function Tests: No results for input(s): TSH, T4TOTAL, FREET4, T3FREE, THYROIDAB in the last 72 hours.  Anemia Panel: Recent Labs    04/01/18 1446  VITAMINB12 435  FERRITIN 155  TIBC 246*  IRON 47  RETICCTPCT 2.9   Urine analysis:    Component Value Date/Time   COLORURINE YELLOW 04/01/2018 0252   APPEARANCEUR CLOUDY (A) 04/01/2018 0252   LABSPEC 1.013 04/01/2018 0252   PHURINE 5.0 04/01/2018 0252   GLUCOSEU NEGATIVE 04/01/2018 0252   HGBUR LARGE (A) 04/01/2018 0252   BILIRUBINUR NEGATIVE 04/01/2018 0252   KETONESUR NEGATIVE 04/01/2018 0252   PROTEINUR 100 (A) 04/01/2018 0252   NITRITE NEGATIVE 04/01/2018 0252   LEUKOCYTESUR SMALL (A) 04/01/2018 0252   Sepsis Labs: @LABRCNTIP (procalcitonin:4,lacticidven:4)  )No results found for this or any previous visit (from the past 240 hour(s)).    Studies: US Renal  Result Date: 04/02/2018 CLINICAL DATA:  Acute kidney injury.  Increased BUN and creatinine. EXAM: RENAL / URINARY TRACT  ULTRASOUND COMPLETE COMPARISON:  CT abdomen and pelvis 01/05/2014 FINDINGS: Right Kidney: Length: 15.1 cm. Multiple simple appearing renal cysts, similar to previous CT. No hydronephrosis. Normal parenchymal echotexture. Left Kidney: Length: 11.2 cm. Multiple simple appearing renal cysts, similar to prior CT. No hydronephrosis. Normal parenchymal echotexture. Bladder: Bladder is decompressed with a Foley catheter. IMPRESSION: Bilateral benign-appearing renal cysts. No hydronephrosis. Foley catheter decompresses the bladder. Electronically Signed   By: Lucienne Capers M.D.   On: 04/02/2018 03:56    Scheduled Meds: . aspirin EC  81 mg Oral Daily  . atorvastatin  80 mg Oral q1800  . carvedilol  3.125 mg Oral BID WC  . clopidogrel  75 mg Oral Daily  . enoxaparin (LOVENOX) injection  30 mg Subcutaneous Daily  . feeding supplement (ENSURE ENLIVE)  237 mL Oral BID BM  . ferrous sulfate  325 mg Oral Q supper  . finasteride  5 mg Oral Daily  . furosemide  40 mg Intravenous BID  . levothyroxine  50 mcg Oral QAC breakfast  . pantoprazole  40 mg Oral Daily  . sodium chloride flush  3 mL Intravenous Q12H  . tamsulosin  0.4 mg Oral Daily    Continuous Infusions: . sodium chloride 250 mL (04/01/18 1259)  . cefTRIAXone (ROCEPHIN)  IV 1 g (04/02/18 1151)     LOS: 2 days     Kayleen Memos, MD [facility] Pager [phone]  If 7PM-7AM, please contact night-coverage [email or web address] Password [email or web address] 04/02/2018, 5:27 PM

## 2018-04-02 NOTE — Progress Notes (Addendum)
CARDIAC REHAB PHASE I   PRE:  Rate/Rhythm: 70 SR    BP: sitting 138/75    SaO2: 98 RA  MODE:  Ambulation: 250 ft   POST:  Rate/Rhythm: 90 SR with PVC    BP: sitting 139/76     SaO2: 99 RA  Pt willing to walk. Moved to EOB and stood independently. Used RW with gait belt, steady, slow pace. Used x2 assist for safety but can be x1 next walk.  Only c/o was weak legs with distance. Pt is extremely hard of hearing and does not have a hearing aide battery. He also does not have his glasses with him. His family does not know he is here. He lives alone and travels to see his wife in dementia care. Will f/u tomorrow. RN trying to contact family.  [phone]  [place], ACSM 04/02/2018 11:00 AM

## 2018-04-03 DIAGNOSIS — L89301 Pressure ulcer of unspecified buttock, stage 1: Secondary | ICD-10-CM

## 2018-04-03 DIAGNOSIS — E039 Hypothyroidism, unspecified: Secondary | ICD-10-CM

## 2018-04-03 DIAGNOSIS — E118 Type 2 diabetes mellitus with unspecified complications: Secondary | ICD-10-CM

## 2018-04-03 LAB — BASIC METABOLIC PANEL
Anion gap: 9 (ref 5–15)
BUN: 64 mg/dL — ABNORMAL HIGH (ref 8–23)
CALCIUM: 8.5 mg/dL — AB (ref 8.9–10.3)
CO2: 25 mmol/L (ref 22–32)
Chloride: 102 mmol/L (ref 98–111)
Creatinine, Ser: 2.58 mg/dL — ABNORMAL HIGH (ref 0.61–1.24)
GFR calc non Af Amer: 20 mL/min — ABNORMAL LOW (ref >60)
GFR, EST AFRICAN AMERICAN: 23 mL/min — AB (ref >60)
Glucose, Bld: 117 mg/dL — ABNORMAL HIGH (ref 70–99)
Potassium: 4.6 mmol/L (ref 3.5–5.1)
SODIUM: 136 mmol/L (ref 135–145)

## 2018-04-03 LAB — URINE CULTURE
Culture: <10000 — AB
Special Requests: NORMAL

## 2018-04-03 MED ORDER — SODIUM CHLORIDE 0.9 % IV BOLUS
250.0000 mL | Freq: Once | INTRAVENOUS | Status: AC
  Administered 2018-04-03: 250 mL via INTRAVENOUS

## 2018-04-03 NOTE — Progress Notes (Signed)
CARDIAC REHAB PHASE I   PRE:  Rate/Rhythm: 64 SR with PVCs  BP:  Sitting: 157/76      SaO2: 95 RA  MODE:  Ambulation: 250 ft   POST:  Rate/Rhythm: 75 SR with PVCs  BP:  Sitting: 148/80    SaO2: 98 RA   Pt ambulated 264ft in hallway assist of one with front wheel walker and gait belt. O2 sats difficult to obtain during walk, pt declined SOB. Sats 98 on RA upon return to room. Pt in recliner, chair alarm on, call bell within reach. Will continue to follow.  [id number] Rufina Falco, RN BSN 04/03/2018 11:45 AM

## 2018-04-03 NOTE — Care Management Important Message (Signed)
Important Message  Patient Details  Name: Randy Fitzpatrick. MRN: [id number] Date of Birth: 1925/05/05   Medicare Important Message Given:  Yes    [name] 04/03/2018, 10:33 AM

## 2018-04-03 NOTE — Progress Notes (Addendum)
PROGRESS NOTE    Randy Fitzpatrick.  KDX:[id number] DOB: [birth year] DOA: 03/31/2018 PCP: Randy Palms, MD      Brief Narrative:  Randy Fitzpatrick is a 84 y.o. M with HTN, diet-controlled DM, CAD s/p PCI early 2019 who presented with 3 days severe SOB, pulmonary edema, pedal edema, elevated BNP.  Started on IV diuresis.     Assessment & Plan:  Acute on chronic systolic CHF Appears euvolemic.  EF 45%.  Cardiology have signed off. -Hold diuretic -Strict I/Os   AKI on CKD Baseline Cr 1.6, today worsened from 2.3 to 2.5.  Renal US with bilateral benign-appearing cysts, no other findings. -Check UA -Hold diuretics -Avoid nephrotoxins. -Push fluids, small fluid bolus -Repeat Cr  UTI vs traumatic foley insertion Initially presented without urinary complaints.  Foley placed in ER, for urinary retention, UA obtained from foley with many RBCs, some blood tinged urine.  Foley now removed.  Urine culture without growth.   -Stop antibiotics and follow fever curve.  Urinary retention BPH Foley removed yesterday and passed voiding trial, good UOP yesterday. -Continue Flomax, finasteride  Macrocytic anemia Chronic blood loss anemia UGIB in July at [place].  Has received 1 unit transfusion since discharge.  Hemoglobin baseline around 9 g/dL in July at [place], stable here.  B12 and folate normal. -Follow-up hemoglobin with PCP -FOBT ordered here -obtain fobts as outpatient -Continue iron  Hypertension Coronary artery disease status post PCI earlier this year -Continue aspirin, atorvastatin, carvedilol, Plavix  Hypothyroidism -Continue levothyroxine  Diet-controlled diabetes Not on meds, glucoses here have been WNL.   Stage I pressure injury to buttock, POA          DVT prophylaxis: Lovenox Code Status: FULL Family Communication: Daughter by phone MDM and disposition Plan: The below labs and imaging reports were reviewed and summarized above.  Medication management as above.  The  patient was admitted with severe CHF flare.  He has been diuresed, now has steadily worsneing kidney function.  Will give IV fluids today, trend Cr. If improving, likely home in 24 to 48 hours.  PT recommend HHPT, with 24 hr supervision, which patient declines.       Consultants:   Cardiology  Procedures:   None  Antimicrobials:   Ceftriaxone 10/7>>10/9    Subjective: Feels no dyspnea, chest pain, orthopnea, leg swelling.  No heamtochezia, melena.  No hematemesis.  No confusion  Objective: Vitals:   04/02/18 1721 04/02/18 2100 04/03/18 0500 04/03/18 0916  BP: (!) 171/65 (!) 109/54 (!) 131/57 (!) 114/45  Pulse: 70 71 67 73  Resp:  17 18   Temp:  97.6 F (36.4 C) 98.3 F (36.8 C)   TempSrc:  Oral Oral   SpO2:  97% 95%   Weight:   72.6 kg   Height:        Intake/Output Summary (Last 24 hours) at 04/03/2018 1351 Last data filed at 04/03/2018 1303 Gross per 24 hour  Intake 1200 ml  Output 1275 ml  Net -75 ml   Filed Weights   04/01/18 0411 04/02/18 1232 04/03/18 0500  Weight: 74.1 kg 72.3 kg 72.6 kg    Examination: General appearance: elderly adult male, alert and in no acute distress.   HEENT: Anicteric, conjunctiva pink, lids and lashes normal. No nasal deformity, discharge, epistaxis.  Lips moist, OP dry, no oral lesions.   Skin: Warm and dry.  no jaundice.  No suspicious rashes or lesions. Cardiac: RRR, nl S1-S2, no murmurs appreciated.  Capillary refill  is brisk.  JVP not visible.  No LE edema.  Radia  pulses 2+ and symmetric. Respiratory: Normal respiratory rate and rhythm.  CTAB without rales or wheezes. Abdomen: Abdomen soft.  no TTP. No ascites, distension, hepatosplenomegaly.   MSK: No deformities or effusions. Neuro: Awake and alert.  EOMI, moves all extremities but globally very weak. Speech fluent.    Psych: Sensorium intact and responding to questions, attention normal. Affect normal.  Judgment and insight appear normal.    Data Reviewed: I have  personally reviewed following labs and imaging studies:  CBC: Recent Labs  Lab 03/31/18 0349 03/31/18 1029 04/01/18 0442 04/01/18 1446 04/02/18 0558  WBC 13.9* 9.6 10.2  --  9.3  NEUTROABS 10.0*  --  8.0*  --   --   HGB 9.0* 8.6* 8.2*  --  8.2*  HCT 29.4* 27.4* 25.7* 25.3* 25.2*  MCV 107.3* 102.2* 101.6*  --  101.2*  PLT 203 177 204  --  235   Basic Metabolic Panel: Recent Labs  Lab 03/31/18 0349 03/31/18 1029 04/01/18 0442 04/02/18 0558 04/03/18 0445  NA 138  --  136 138 136  K 5.6*  --  4.9 4.6 4.6  CL 110  --  105 105 102  CO2 19*  --  22 24 25   GLUCOSE 212*  --  173* 109* 117*  BUN 34*  --  45* 54* 64*  CREATININE 2.15* 2.10* 2.28* 2.35* 2.58*  CALCIUM 8.9  --  8.8* 8.7* 8.5*   GFR: Estimated Creatinine Clearance: 19.2 mL/min (A) (by C-G formula based on SCr of 2.58 mg/dL (H)). Liver Function Tests: Recent Labs  Lab 04/01/18 0442  AST 21  ALT 17  ALKPHOS 52  BILITOT 0.6  PROT 6.7  ALBUMIN 3.2*   No results for input(s): LIPASE, AMYLASE in the last 168 hours. No results for input(s): AMMONIA in the last 168 hours. Coagulation Profile: No results for input(s): INR, PROTIME in the last 168 hours. Cardiac Enzymes: No results for input(s): CKTOTAL, CKMB, CKMBINDEX, TROPONINI in the last 168 hours. BNP (last 3 results) No results for input(s): PROBNP in the last 8760 hours. HbA1C: No results for input(s): HGBA1C in the last 72 hours. CBG: No results for input(s): GLUCAP in the last 168 hours. Lipid Profile: No results for input(s): CHOL, HDL, LDLCALC, TRIG, CHOLHDL, LDLDIRECT in the last 72 hours. Thyroid Function Tests: No results for input(s): TSH, T4TOTAL, FREET4, T3FREE, THYROIDAB in the last 72 hours. Anemia Panel: Recent Labs    04/01/18 1446  VITAMINB12 435  FERRITIN 155  TIBC 246*  IRON 47  RETICCTPCT 2.9   Urine analysis:    Component Value Date/Time   COLORURINE YELLOW 04/01/2018 0252   APPEARANCEUR CLOUDY (A) 04/01/2018 0252    LABSPEC 1.013 04/01/2018 0252   PHURINE 5.0 04/01/2018 0252   GLUCOSEU NEGATIVE 04/01/2018 0252   HGBUR LARGE (A) 04/01/2018 0252   BILIRUBINUR NEGATIVE 04/01/2018 0252   KETONESUR NEGATIVE 04/01/2018 0252   PROTEINUR 100 (A) 04/01/2018 0252   NITRITE NEGATIVE 04/01/2018 0252   LEUKOCYTESUR SMALL (A) 04/01/2018 0252   Sepsis Labs: @LABRCNTIP (procalcitonin:4,lacticacidven:4)  ) Recent Results (from the past 240 hour(s))  Culture, Urine     Status: Abnormal   Collection Time: 04/01/18 11:42 PM  Result Value Ref Range Status   Specimen Description URINE, CLEAN CATCH  Final   Special Requests Normal  Final   Culture (A)  Final    <10,000 COLONIES/mL INSIGNIFICANT GROWTH Performed at [facility],  [address], [place], [place] [address]    Report Status 04/03/2018 FINAL  Final         Radiology Studies: US Renal  Result Date: 04/02/2018 CLINICAL DATA:  Acute kidney injury.  Increased BUN and creatinine. EXAM: RENAL / URINARY TRACT ULTRASOUND COMPLETE COMPARISON:  CT abdomen and pelvis 01/05/2014 FINDINGS: Right Kidney: Length: 15.1 cm. Multiple simple appearing renal cysts, similar to previous CT. No hydronephrosis. Normal parenchymal echotexture. Left Kidney: Length: 11.2 cm. Multiple simple appearing renal cysts, similar to prior CT. No hydronephrosis. Normal parenchymal echotexture. Bladder: Bladder is decompressed with a Foley catheter. IMPRESSION: Bilateral benign-appearing renal cysts. No hydronephrosis. Foley catheter decompresses the bladder. Electronically Signed   By: Lucienne Capers M.D.   On: 04/02/2018 03:56        Scheduled Meds: . aspirin EC  81 mg Oral Daily  . atorvastatin  80 mg Oral q1800  . carvedilol  3.125 mg Oral BID WC  . clopidogrel  75 mg Oral Daily  . enoxaparin (LOVENOX) injection  30 mg Subcutaneous Daily  . feeding supplement (ENSURE ENLIVE)  237 mL Oral BID BM  . ferrous sulfate  325 mg Oral Q supper  . finasteride  5 mg Oral Daily    . levothyroxine  50 mcg Oral QAC breakfast  . pantoprazole  40 mg Oral Daily  . sodium chloride flush  3 mL Intravenous Q12H  . tamsulosin  0.4 mg Oral Daily   Continuous Infusions: . sodium chloride 250 mL (04/01/18 1259)  . cefTRIAXone (ROCEPHIN)  IV 1 g (04/03/18 1112)  . sodium chloride       LOS: 3 days    Time spent: 25 minutes    Edwin Dada, MD [facility] 04/03/2018, 1:51 PM     Pager [phone] --- please page though AMION:  [email or web address] Password [email or web address] If 7PM-7AM, please contact night-coverage

## 2018-04-03 NOTE — Progress Notes (Signed)
Physical Therapy Treatment Patient Details Name: Randy Fitzpatrick. MRN: [id number] DOB: 10-26-1924 Today's Date: 04/03/2018    History of Present Illness Patient is a 84 y/o male presenting to the ED on 03/31/18 with primary complaints of SOB. Past medical history significant for hypertension, type 2 diabetes mellitus, hyperlipidemia, coronary artery disease S/P MI STEMI with stent placement about 5 months ago. Admitted for acute on chronic CHF work-up.     PT Comments    Pt was seen for mobility after having been on room air, noted his O2 sats on room air fluctuated from95% to 73%, rested then maintained no lower than 85% with gait.  He was at 160 pulse end of session when PT checked but quickly recovered to 81 pulse.  Will recommend follow up to see if he is going to need O2 for home or to see if this is not typically an issue.     Follow Up Recommendations  Home health PT;Supervision/Assistance - 24 hour     Equipment Recommendations  None recommended by PT    Recommendations for Other Services       Precautions / Restrictions Precautions Precautions: Fall Restrictions Weight Bearing Restrictions: No    Mobility  Bed Mobility               General bed mobility comments: up in chair when PT arrived  Transfers Overall transfer level: Needs assistance Equipment used: Rolling walker (2 wheeled) Transfers: Sit to/from Stand Sit to Stand: Min guard         General transfer comment: min guard for safety and immediate standing balance  Ambulation/Gait Ambulation/Gait assistance: Min guard Gait Distance (Feet): 120 Feet Assistive device: Rolling walker (2 wheeled) Gait Pattern/deviations: Step-through pattern;Decreased stride length;Narrow base of support Gait velocity: decreased   General Gait Details: using walker and monitored O2 sats with effort   Stairs             Wheelchair Mobility    Modified Rankin (Stroke Patients Only)       Balance  Overall balance assessment: Needs assistance Sitting-balance support: Feet supported Sitting balance-Leahy Scale: Good     Standing balance support: Bilateral upper extremity supported;During functional activity Standing balance-Leahy Scale: Fair Standing balance comment: less than fair with movement                            Cognition Arousal/Alertness: Awake/alert Behavior During Therapy: WFL for tasks assessed/performed Overall Cognitive Status: Within Functional Limits for tasks assessed                                        Exercises General Exercises - Lower Extremity Ankle Circles/Pumps: AAROM;Both;10 reps    General Comments General comments (skin integrity, edema, etc.): O2 sats were variable and dropped with effort on room air      Pertinent Vitals/Pain Pain Assessment: No/denies pain    Home Living                      Prior Function            PT Goals (current goals can now be found in the care plan section) Acute Rehab PT Goals Patient Stated Goal: get home Progress towards PT goals: Progressing toward goals    Frequency    Min 3X/week  PT Plan Current plan remains appropriate    Co-evaluation              AM-PAC PT "6 Clicks" Daily Activity  Outcome Measure  Difficulty turning over in bed (including adjusting bedclothes, sheets and blankets)?: A Little Difficulty moving from lying on back to sitting on the side of the bed? : A Little Difficulty sitting down on and standing up from a chair with arms (e.g., wheelchair, bedside commode, etc,.)?: Unable Help needed moving to and from a bed to chair (including a wheelchair)?: A Little Help needed walking in hospital room?: A Little Help needed climbing 3-5 steps with a railing? : A Lot 6 Click Score: 15    End of Session Equipment Utilized During Treatment: Gait belt Activity Tolerance: Patient tolerated treatment well;Treatment limited secondary  to medical complications (Comment) Patient left: in chair;with call bell/phone within reach;with chair alarm set Nurse Communication: Mobility status PT Visit Diagnosis: Unsteadiness on feet (R26.81);Other abnormalities of gait and mobility (R26.89);Muscle weakness (generalized) (M62.81)     Time: 7622-6333 PT Time Calculation (min) (ACUTE ONLY): 24 min  Charges:  $Gait Training: 8-22 mins $Therapeutic Exercise: 8-22 mins                     Ramond Dial 04/03/2018, 1:03 PM   Mee Hives, PT MS Acute Rehab Dept. Number: [place] [facility] [place]

## 2018-04-04 ENCOUNTER — Other Ambulatory Visit: Payer: Self-pay

## 2018-04-04 LAB — BASIC METABOLIC PANEL
Anion gap: 7 (ref 5–15)
BUN: 63 mg/dL — AB (ref 8–23)
CHLORIDE: 108 mmol/L (ref 98–111)
CO2: 24 mmol/L (ref 22–32)
Calcium: 8.4 mg/dL — ABNORMAL LOW (ref 8.9–10.3)
Creatinine, Ser: 2.36 mg/dL — ABNORMAL HIGH (ref 0.61–1.24)
GFR calc Af Amer: 26 mL/min — ABNORMAL LOW (ref >60)
GFR calc non Af Amer: 23 mL/min — ABNORMAL LOW (ref >60)
GLUCOSE: 123 mg/dL — AB (ref 70–99)
Potassium: 4.5 mmol/L (ref 3.5–5.1)
SODIUM: 139 mmol/L (ref 135–145)

## 2018-04-04 LAB — CBC
HEMATOCRIT: 25.2 % — AB (ref 39.0–52.0)
Hemoglobin: 8.2 g/dL — ABNORMAL LOW (ref 13.0–17.0)
MCH: 32.3 pg (ref 26.0–34.0)
MCHC: 32.5 g/dL (ref 30.0–36.0)
MCV: 99.2 fL (ref 80.0–100.0)
Platelets: 199 10*3/uL (ref 150–400)
RBC: 2.54 MIL/uL — ABNORMAL LOW (ref 4.22–5.81)
RDW: 15.4 % (ref 11.5–15.5)
WBC: 6.8 10*3/uL (ref 4.0–10.5)
nRBC: 0 % (ref 0.0–0.2)

## 2018-04-04 MED ORDER — FUROSEMIDE 20 MG PO TABS
20.0000 mg | ORAL_TABLET | Freq: Every day | ORAL | Status: DC
  Administered 2018-04-04 – 2018-04-05 (×2): 20 mg via ORAL
  Filled 2018-04-04 (×2): qty 1

## 2018-04-04 NOTE — Plan of Care (Signed)

## 2018-04-04 NOTE — Progress Notes (Signed)
PROGRESS NOTE    Randy Fitzpatrick.  QVZ:[id number] DOB: [birth year] DOA: 03/31/2018 PCP: Ranae Palms, MD      Brief Narrative:  Randy Fitzpatrick is a 84 y.o. M with HTN, diet-controlled DM, CAD s/p PCI early 2019 who presented with 3 days severe SOB, pulmonary edema, pedal edema, elevated BNP.  Started on IV diuresis.     Assessment & Plan:  Acute on chronic systolic CHF Appears euvolemic.  EF 45%.  Cardiology have signed off.  Net negative 6L on admission, slightly net positive (with IV fluids yesterday).   -Start oral Lasix 20 mg daily today -Trend Cr tomorrow -Strict I/Os -If Cr stable or improving on oral Lasix, will discharge to home   AKI on CKD Baseline Cr 1.6, today worsened from 2.3 to 2.5, better today to 2.3 again.  Renal US with bilateral benign-appearing cysts, no other findings.  UA with red and white blood cells, no casts. -Cautious restart lasix -Repeat Cr -Avoid nephrotoxins.  UTI vs traumatic foley insertion Initially presented without urinary complaints.  Foley placed in ER, for urinary retention, UA obtained from foley with many RBCs, some blood tinged urine.  Foley now removed.  Urine culture without growth.  Antibiotics stopped yesterday, no fever. -Follow fever curve  Urinary retention BPH Foley removed 2 days ago and passed voiding trial, maintains good urine output. -Continue Flomax, finasteride  Macrocytic anemia Chronic blood loss anemia UGIB in July at [place].  Has received 1 unit transfusion since discharge, none during his hospitalization.  Hemoglobin baseline around 9 g/dL in July at [place], stable here.  B12 and folate normal. -Follow-up hemoglobin with PCP -FOBT ordered here -Obtain FOBT as outpatient -Continue iron  Hypertension Coronary artery disease status post PCI earlier this year -Continue aspirin, atorvastatin, carvedilol, Plavix  Hypothyroidism -Continue levothyroxine  Diet-controlled diabetes Not on meds, glucoses here have been  WNL.   Stage I pressure injury to buttock, POA -Continue nutritional supplement  Severe protein calorie malnutrition As evidenced by loss of subcutaneous muscle mass and fat diffusely, decreased functional status, weakness.    DVT prophylaxis: Lovenox Code Status: FULL Family Communication: Daughter by phone MDM and disposition Plan: The below labs and imaging reports were reviewed and summarized above.  Medication management as above.  The patient was admitted with severe CHF flare, he has been diuresed, and now has had steadily worsening kidney function.  He was given IV fluids yesterday and his creatinine has started to improve, today we will start an oral diuretic regimen, and if his creatinine is stable to improving, will discharge tomorrow with home health PT       Consultants:   Cardiology  Procedures:   None  Antimicrobials:   Ceftriaxone 10/7>>10/9    Subjective: No new fever, bladder pain, dysuria.  No hematochezia, melena.  No hematemesis.  No chest pain, orthopnea, leg swelling.  Objective: Vitals:   04/03/18 1719 04/03/18 2158 04/04/18 0548 04/04/18 0832  BP: 106/85 (!) 121/40 (!) 147/66 (!) 149/66  Pulse: 78 (!) 57 67 72  Resp:      Temp:  98.3 F (36.8 C) 97.7 F (36.5 C)   TempSrc:  Oral Oral   SpO2:  94% 100%   Weight:      Height:        Intake/Output Summary (Last 24 hours) at 04/04/2018 1127 Last data filed at 04/04/2018 1100 Gross per 24 hour  Intake 740.53 ml  Output 675 ml  Net 65.53 ml   Filed  Weights   04/01/18 0411 04/02/18 1232 04/03/18 0500  Weight: 74.1 kg 72.3 kg 72.6 kg    Examination: General appearance: Frail elderly adult male. HEENT: Anicteric, conjunctival pink, lids and lashes normal.  No nasal deformity, discharge, or epistaxis.  Lips moist, dentition normal, oropharynx moist, no oral lesions, hearing diminished.   Skin: Skin warm and dry, no jaundice, no suspicious rashes or lesions. Cardiac: Regular rate and  rhythm, normal S1 and S2, soft systolic murmur, no capillary refill delay, JVP normal, no lower extremity edema. Respiratory: Normal respiratory rate and rhythm, lungs clear without rales wheezes. Abdomen: Abdomen soft without tenderness to palpation, ascites, distention, or hepatosplenomegaly. MSK: No deformities or effusions of the large joints of the upper lower extremities bilaterally.   Neuro: Awake and alert, sitting up in recliner, extraocular movements intact, moves all extremities with symmetric but globally weak strength.  Speech fluent.    Psych: Sensorium intact and responding to questions, oriented to person, place, and time.  Attention normal.  Affect blunted.  Judgment and insight appear slightly impaired.    Data Reviewed: I have personally reviewed following labs and imaging studies:  CBC: Recent Labs  Lab 03/31/18 0349 03/31/18 1029 04/01/18 0442 04/01/18 1446 04/02/18 0558 04/04/18 0336  WBC 13.9* 9.6 10.2  --  9.3 6.8  NEUTROABS 10.0*  --  8.0*  --   --   --   HGB 9.0* 8.6* 8.2*  --  8.2* 8.2*  HCT 29.4* 27.4* 25.7* 25.3* 25.2* 25.2*  MCV 107.3* 102.2* 101.6*  --  101.2* 99.2  PLT 203 177 204  --  206 671   Basic Metabolic Panel: Recent Labs  Lab 03/31/18 0349 03/31/18 1029 04/01/18 0442 04/02/18 0558 04/03/18 0445 04/04/18 0336  NA 138  --  136 138 136 139  K 5.6*  --  4.9 4.6 4.6 4.5  CL 110  --  105 105 102 108  CO2 19*  --  22 24 25 24   GLUCOSE 212*  --  173* 109* 117* 123*  BUN 34*  --  45* 54* 64* 63*  CREATININE 2.15* 2.10* 2.28* 2.35* 2.58* 2.36*  CALCIUM 8.9  --  8.8* 8.7* 8.5* 8.4*   GFR: Estimated Creatinine Clearance: 20.9 mL/min (A) (by C-G formula based on SCr of 2.36 mg/dL (H)). Liver Function Tests: Recent Labs  Lab 04/01/18 0442  AST 21  ALT 17  ALKPHOS 52  BILITOT 0.6  PROT 6.7  ALBUMIN 3.2*   No results for input(s): LIPASE, AMYLASE in the last 168 hours. No results for input(s): AMMONIA in the last 168  hours. Coagulation Profile: No results for input(s): INR, PROTIME in the last 168 hours. Cardiac Enzymes: No results for input(s): CKTOTAL, CKMB, CKMBINDEX, TROPONINI in the last 168 hours. BNP (last 3 results) No results for input(s): PROBNP in the last 8760 hours. HbA1C: No results for input(s): HGBA1C in the last 72 hours. CBG: No results for input(s): GLUCAP in the last 168 hours. Lipid Profile: No results for input(s): CHOL, HDL, LDLCALC, TRIG, CHOLHDL, LDLDIRECT in the last 72 hours. Thyroid Function Tests: No results for input(s): TSH, T4TOTAL, FREET4, T3FREE, THYROIDAB in the last 72 hours. Anemia Panel: Recent Labs    04/01/18 1446  VITAMINB12 435  FERRITIN 155  TIBC 246*  IRON 47  RETICCTPCT 2.9   Urine analysis:    Component Value Date/Time   COLORURINE YELLOW 04/01/2018 0252   APPEARANCEUR CLOUDY (A) 04/01/2018 0252   LABSPEC 1.013 04/01/2018 0252  PHURINE 5.0 04/01/2018 0252   GLUCOSEU NEGATIVE 04/01/2018 0252   HGBUR LARGE (A) 04/01/2018 0252   BILIRUBINUR NEGATIVE 04/01/2018 0252   KETONESUR NEGATIVE 04/01/2018 0252   PROTEINUR 100 (A) 04/01/2018 0252   NITRITE NEGATIVE 04/01/2018 0252   LEUKOCYTESUR SMALL (A) 04/01/2018 0252   Sepsis Labs: @LABRCNTIP (procalcitonin:4,lacticacidven:4)  ) Recent Results (from the past 240 hour(s))  Culture, Urine     Status: Abnormal   Collection Time: 04/01/18 11:42 PM  Result Value Ref Range Status   Specimen Description URINE, CLEAN CATCH  Final   Special Requests Normal  Final   Culture (A)  Final    <10,000 COLONIES/mL INSIGNIFICANT GROWTH Performed at [facility], [address], [place], [place] [address]    Report Status 04/03/2018 FINAL  Final         Radiology Studies: No results found.      Scheduled Meds: . aspirin EC  81 mg Oral Daily  . atorvastatin  80 mg Oral q1800  . carvedilol  3.125 mg Oral BID WC  . clopidogrel  75 mg Oral Daily  . enoxaparin (LOVENOX) injection  30 mg  Subcutaneous Daily  . feeding supplement (ENSURE ENLIVE)  237 mL Oral BID BM  . ferrous sulfate  325 mg Oral Q supper  . finasteride  5 mg Oral Daily  . furosemide  20 mg Oral Daily  . levothyroxine  50 mcg Oral QAC breakfast  . pantoprazole  40 mg Oral Daily  . sodium chloride flush  3 mL Intravenous Q12H  . tamsulosin  0.4 mg Oral Daily   Continuous Infusions: . sodium chloride 250 mL (04/01/18 1259)     LOS: 4 days    Time spent: 25 minutes    Edwin Dada, MD [facility] 04/04/2018, 11:27 AM     Pager [phone] --- please page though AMION:  [email or web address] Password [email or web address] If 7PM-7AM, please contact night-coverage

## 2018-04-04 NOTE — Progress Notes (Signed)
Physical Therapy Treatment Patient Details Name: Randy Fitzpatrick. MRN: [id number] DOB: 1925/03/24 Today's Date: 04/04/2018    History of Present Illness Patient is a 84 y/o male presenting to the ED on 03/31/18 with primary complaints of SOB. Past medical history significant for hypertension, type 2 diabetes mellitus, hyperlipidemia, coronary artery disease S/P MI STEMI with stent placement about 5 months ago. Admitted for acute on chronic CHF work-up.     PT Comments    Pt doing well with mobility. Expect he is approaching his baseline.    Follow Up Recommendations  Home health PT;Supervision - Intermittent     Equipment Recommendations  None recommended by PT    Recommendations for Other Services       Precautions / Restrictions Precautions Precautions: Fall Restrictions Weight Bearing Restrictions: No    Mobility  Bed Mobility Overal bed mobility: Modified Independent                Transfers Overall transfer level: Modified independent Equipment used: Rolling walker (2 wheeled) Transfers: Sit to/from Stand Sit to Stand: Modified independent (Device/Increase time)         General transfer comment: Pt able to rise without assist  Ambulation/Gait Ambulation/Gait assistance: Modified independent (Device/Increase time) Gait Distance (Feet): 350 Feet Assistive device: Rolling walker (2 wheeled) Gait Pattern/deviations: Step-through pattern;Decreased stride length Gait velocity: decreased Gait velocity interpretation: 1.31 - 2.62 ft/sec, indicative of limited community ambulator General Gait Details: Steady gait with walker. Amb on RA. SpO2 with good waveform >92%   Stairs             Wheelchair Mobility    Modified Rankin (Stroke Patients Only)       Balance Overall balance assessment: Needs assistance Sitting-balance support: Feet supported Sitting balance-Leahy Scale: Good     Standing balance support: During functional activity;No  upper extremity supported Standing balance-Leahy Scale: Fair                              Cognition Arousal/Alertness: Awake/alert Behavior During Therapy: WFL for tasks assessed/performed Overall Cognitive Status: Within Functional Limits for tasks assessed                                        Exercises      General Comments        Pertinent Vitals/Pain Pain Assessment: No/denies pain    Home Living                      Prior Function            PT Goals (current goals can now be found in the care plan section) Progress towards PT goals: Progressing toward goals    Frequency    Min 3X/week      PT Plan Current plan remains appropriate    Co-evaluation              AM-PAC PT "6 Clicks" Daily Activity  Outcome Measure  Difficulty turning over in bed (including adjusting bedclothes, sheets and blankets)?: None Difficulty moving from lying on back to sitting on the side of the bed? : None Difficulty sitting down on and standing up from a chair with arms (e.g., wheelchair, bedside commode, etc,.)?: None Help needed moving to and from a bed to chair (including a wheelchair)?: None Help needed  walking in hospital room?: None Help needed climbing 3-5 steps with a railing? : A Little 6 Click Score: 23    End of Session Equipment Utilized During Treatment: Gait belt Activity Tolerance: Patient tolerated treatment well Patient left: in chair;with call bell/phone within reach;with chair alarm set Nurse Communication: Mobility status PT Visit Diagnosis: Other abnormalities of gait and mobility (R26.89);Muscle weakness (generalized) (M62.81);Unsteadiness on feet (R26.81)     Time: 0263-7858 PT Time Calculation (min) (ACUTE ONLY): 14 min  Charges:  $Gait Training: 8-22 mins                     Winter Pager [phone] Office [place] 04/04/2018, 11:40  AM

## 2018-04-04 NOTE — Progress Notes (Signed)
CARDIAC REHAB PHASE I   Offered to walk with pt, pt declining stating he walked twice today to the end of the hall and back both times. Pt states he can see his ankles, and that they haven't been that small in "years". Pt states he lives alone, and doesn't have family in the area. Pt still drives which makes getting in-home services difficult. Encouraged pt to weigh daily, pt states he has a scale at home, but will "let the doctors keep track of that". Encouraged continued safe mobility as able. Will continue to follow.  [phone] Rufina Falco, RN BSN 04/04/2018 2:32 PM

## 2018-04-05 DIAGNOSIS — I5023 Acute on chronic systolic (congestive) heart failure: Secondary | ICD-10-CM

## 2018-04-05 LAB — BASIC METABOLIC PANEL
ANION GAP: 11 (ref 5–15)
BUN: 64 mg/dL — AB (ref 8–23)
CHLORIDE: 105 mmol/L (ref 98–111)
CO2: 24 mmol/L (ref 22–32)
Calcium: 8.7 mg/dL — ABNORMAL LOW (ref 8.9–10.3)
Creatinine, Ser: 2.28 mg/dL — ABNORMAL HIGH (ref 0.61–1.24)
GFR calc Af Amer: 27 mL/min — ABNORMAL LOW (ref >60)
GFR, EST NON AFRICAN AMERICAN: 23 mL/min — AB (ref >60)
GLUCOSE: 115 mg/dL — AB (ref 70–99)
POTASSIUM: 4.5 mmol/L (ref 3.5–5.1)
Sodium: 140 mmol/L (ref 135–145)

## 2018-04-05 MED ORDER — ENSURE ENLIVE PO LIQD: 237.0000 mL | Freq: Two times a day (BID) | ORAL | 12 refills | Status: DC

## 2018-04-05 MED ORDER — FUROSEMIDE 20 MG PO TABS: 20.0000 mg | ORAL_TABLET | Freq: Every day | ORAL | 1 refills | Status: DC

## 2018-04-05 MED ORDER — POTASSIUM CHLORIDE ER 10 MEQ PO TBCR: 10.0000 meq | EXTENDED_RELEASE_TABLET | Freq: Every day | ORAL | 1 refills | Status: DC

## 2018-04-05 NOTE — Progress Notes (Signed)
CARDIAC REHAB PHASE I    HF booklet given to, and reviewed with pt. Strongly encouraged daily weights. Pt continues with concerns about if he will be able to get help at home. Pt considering a short stay in rehab. RN and CM made aware.   [phone] Rufina Falco, RN BSN 04/05/2018 8:49 AM

## 2018-04-05 NOTE — Discharge Instructions (Addendum)
Heart Failure Action Plan A heart failure action plan helps you understand what to do when you have symptoms of heart failure. Follow the plan that was created by you and your health care provider. Review your plan each time you visit your health care provider. Red zone These signs and symptoms mean you should get medical help right away:  You have trouble breathing when resting.  You have a dry cough that is getting worse.  You have swelling or pain in your legs or abdomen that is getting worse.  You suddenly gain more than 2-3 lb (0.9-1.4 kg) in a day, or more than 5 lb (2.3 kg) in one week. This amount may be more or less depending on your condition.  You have trouble staying awake or you feel confused.  You have chest pain.  You do not have an appetite.  You pass out.  If you experience any of these symptoms:  Call your local emergency services (911 in the [place] right away or seek help at the emergency department of the nearest hospital.  Yellow zone These signs and symptoms mean your condition may be getting worse and you should make some changes:  You have trouble breathing when you are active or you need to sleep with extra pillows.  You have swelling in your legs or abdomen.  You gain 2-3 lb (0.9-1.4 kg) in one day, or 5 lb (2.3 kg) in one week. This amount may be more or less depending on your condition.  You get tired easily.  You have trouble sleeping.  You have a dry cough.  If you experience any of these symptoms:  Contact your health care provider within the next day.  Your health care provider may adjust your medicines.  Green zone These signs mean you are doing well and can continue what you are doing:  You do not have shortness of breath.  You have very little swelling or no new swelling.  Your weight is stable (no gain or loss).  You have a normal activity level.  You do not have chest pain or any other new symptoms.  Follow these  instructions at home:  Take over-the-counter and prescription medicines only as told by your health care provider.  Weigh yourself daily. Your target weight is __________ lb (__________ kg). ? Call your health care provider if you gain more than __________ lb (__________ kg) in a day, or more than __________ lb (__________ kg) in one week.  Eat a heart-healthy diet. Work with a diet and nutrition specialist (dietitian) to create an eating plan that is best for you.  Keep all follow-up visits as told by your health care provider. This is important. Where to find more information:  American Heart Association: www.heart.org Summary  Follow the action plan that was created by you and your health care provider.  Get help right away if you have any symptoms in the Red zone. This information is not intended to replace advice given to you by your health care provider. Make sure you discuss any questions you have with your health care provider. Document Released: 07/22/2016 Document Revised: 07/22/2016 Document Reviewed: 07/22/2016 Elsevier Interactive Patient Education  2018 [employer].  Heart Failure Heart failure means your heart has trouble pumping blood. This makes it hard for your body to work well. Heart failure is usually a long-term (chronic) condition. You must take good care of yourself and follow your doctor's treatment plan. Follow these instructions at home:  Take your  heart medicine as told by your doctor. ? Do not stop taking medicine unless your doctor tells you to. ? Do not skip any dose of medicine. ? Refill your medicines before they run out. ? Take other medicines only as told by your doctor or pharmacist.  Stay active if told by your doctor. The elderly and people with severe heart failure should talk with a doctor about physical activity.  Eat heart-healthy foods. Choose foods that are without trans fat and are low in saturated fat, cholesterol, and salt (sodium).  This includes fresh or frozen fruits and vegetables, fish, lean meats, fat-free or low-fat dairy foods, whole grains, and high-fiber foods. Lentils and dried peas and beans (legumes) are also good choices.  Limit salt if told by your doctor.  Cook in a healthy way. Roast, grill, broil, bake, poach, steam, or stir-fry foods.  Limit fluids as told by your doctor.  Weigh yourself every morning. Do this after you pee (urinate) and before you eat breakfast. Write down your weight to give to your doctor.  Take your blood pressure and write it down if your doctor tells you to.  Ask your doctor how to check your pulse. Check your pulse as told.  Lose weight if told by your doctor.  Stop smoking or chewing tobacco. Do not use gum or patches that help you quit without your doctor's approval.  Schedule and go to doctor visits as told.  Nonpregnant women should have no more than 1 drink a day. Men should have no more than 2 drinks a day. Talk to your doctor about drinking alcohol.  Stop illegal drug use.  Stay current with shots (immunizations).  Manage your health conditions as told by your doctor.  Learn to manage your stress.  Rest when you are tired.  If it is really hot outside: ? Avoid intense activities. ? Use air conditioning or fans, or get in a cooler place. ? Avoid caffeine and alcohol. ? Wear loose-fitting, lightweight, and light-colored clothing.  If it is really cold outside: ? Avoid intense activities. ? Layer your clothing. ? Wear mittens or gloves, a hat, and a scarf when going outside. ? Avoid alcohol.  Learn about heart failure and get support as needed.  Get help to maintain or improve your quality of life and your ability to care for yourself as needed. Contact a doctor if:  You gain weight quickly.  You are more short of breath than usual.  You cannot do your normal activities.  You tire easily.  You cough more than normal, especially with  activity.  You have any or more puffiness (swelling) in areas such as your hands, feet, ankles, or belly (abdomen).  You cannot sleep because it is hard to breathe.  You feel like your heart is beating fast (palpitations).  You get dizzy or light-headed when you stand up. Get help right away if:  You have trouble breathing.  There is a change in mental status, such as becoming less alert or not being able to focus.  You have chest pain or discomfort.  You faint. This information is not intended to replace advice given to you by your health care provider. Make sure you discuss any questions you have with your health care provider. Document Released: 03/21/2008 Document Revised: 11/18/2015 Document Reviewed: 07/29/2012 Elsevier Interactive Patient Education  2017 [employer].

## 2018-04-05 NOTE — Progress Notes (Signed)
Pt in stable condition at discharge. I reviewed d/c instructions with patient and daughter Adela Lank. No further questions. Pt d/c'd via wheelchair to private vehcile

## 2018-04-05 NOTE — Discharge Summary (Addendum)
Physician Discharge Summary  Randy Fitzpatrick. ZOX:[id number] DOB: 82/11/1924 DOA: 03/31/2018  PCP: Ranae Palms, MD  Admit date: 03/31/2018 Discharge date: 04/05/2018  Admitted From: Home  Disposition:  Home with home health  Recommendations for Outpatient Follow-up:  1. Follow up with PCP in 1 week 2. Please obtain basic metabolic panel in 1 week, adjust Lasix depending 3. Please check urinalysis in 6 weeks to reevaluate microscopic hematuria 4. Patient did not need transfusion of blood here, but his Hgb still trending down on Plavix after recent stent; Consider serial FOBT and referral if needed     Home Health: Yes Equipment/Devices: None  Discharge Condition: Fair CODE STATUS: Full code Diet recommendation: Cardiac, low-sodium  Brief/Interim Summary: Randy Fitzpatrick is a 84 y.o. M with HTN, diet-controlled DM, CAD with recent NSTEMI earlier this year, new LVSD who presented with 3 days severe SOB found to have pulmonary edema on CXR, pedal edema, elevated BNP.  Started on IV diuresis.       Principal discharge diagnosis Acute on chronic systolic CHF    Discharge Diagnoses:   Acute on chronic systolic CHF Started on IV Lasix, diuresed net -6 L on admission.  Cardiology was consulted, echocardiogram repeated, ejection fraction essentially the same 40% now.    Creatinine began to worsen to 2.6 with diuresis, which was held.  Before discharge, patient was started on oral Lasix regimen, had slight net negative, creatinine continue to improve.  On BB.  ACEi/ARB/Entresto contraindicated given renal function. -Continue Lasix 20 mg oral daily -Weigh daily - Check BMP in 1 week   Acute kidney injury on chronic kidney disease stage III Patient's most recent baseline around 2 per daughter.  Peaked 2.6 here, IV diuretic held, continued to improve on oral Lasix 20.  Renal US obtained, no acute disease. -Close follow up renal function   Micro hematuria from UTI vs traumatic  foley insertion Initially presented without urinary complaints.  Foley placed in ER, for urinary retention, UA obtained from foley with many RBCs.  Foley later removed without urine retention.  Urine culture without growth.  Treated with ceftriaxone 1g x3, then antibiotics stopped, no fever. -Repeat UA in 6 weeks  Urinary retention BPH No change to Flomax, finasteride  Macrocytic anemia Chronic blood loss anemia UGIB in July at [facility].  Had received 1 unit transfusion since discharge per report.  Hgb stable here, no need for transfusion.  B12 and folate normal. -Obtain FOBT as outpatient  Hypertension Coronary artery disease status post PCI earlier this year, ischemic Cardiomyopathy On excellent secondary prevention regimen, ASA, BB, Plavix, statin.  Hypothyroidism  Diet-controlled diabetes Not on meds, glucoses here have been WNL.   Stage I pressure injury to buttock, POA Continue nutritional supplement at discharge.  Severe protein calorie malnutrition As evidenced by loss of subcutaneous muscle mass and fat diffusely, decreased functional status, weakness.          Discharge Instructions  Discharge Instructions    Diet - low sodium heart healthy   Complete by:  As directed    Discharge instructions   Complete by:  As directed    From Dr. Loleta Books: You were admitted with trouble breathing.  When you got to the hospital, your chest x-ray showed "pulmonary edema", which means fluid backed up into the lungs from the heart.   These represent signs of an episode of congestive heart failure, which is common after a heart attack.  You were treated with diuretics ("fluid pills"), called Lasix  Going forward: 1. Take furosemide/Lasix 20 mg once daily in the morning 2. Avoid salt containing or high sodium foods, limit to 2000 mg salt daily 3. Get a scale and WEIGH YOURSELF every day          Weigh yourself today when you get home.  This is your "dry weight"           Weigh yourself every morning in your pajamas when you wake up          If you ever notice more than 3 lbs weight gain in a day or more than 5 lbs weight gain TOTAL from your "dry weight" call Dr. Allen Kell office immediately, even on the weekend 4. Lasix depletes potassium, so take a potassium supplement (10 mEq, prescribed) daily with the Lasix 5. Have Dr. Hampton Abbot check your kidney function and electrolytes in 1 week    We recommend you take Ensure once or twice daily ebtween meals Monitor the red spot on your back/bottom.  While you were here, we looked at your urine under the microscope.  There was small blood in it.  THis is probably just from having the tube placed in your bladder. However, make sure Dr. Hampton Abbot rechecks your urine in about 6 weeks.   Increase activity slowly   Complete by:  As directed      Allergies as of 04/05/2018   No Known Allergies     Medication List    TAKE these medications   aspirin 81 MG EC tablet Take 1 tablet (81 mg total) by mouth daily.   atorvastatin 80 MG tablet Commonly known as:  LIPITOR TAKE 1 TABLET BY MOUTH DAILY AT 6 PM. What changed:    how much to take  how to take this  when to take this  additional instructions   carvedilol 3.125 MG tablet Commonly known as:  COREG Take 1 tablet (3.125 mg total) by mouth 2 (two) times daily with a meal.   clopidogrel 75 MG tablet Commonly known as:  PLAVIX Take 1 tablet (75 mg total) by mouth daily.   feeding supplement (ENSURE ENLIVE) Liqd Take 237 mLs by mouth 2 (two) times daily between meals.   finasteride 5 MG tablet Commonly known as:  PROSCAR Take 5 mg by mouth daily.   furosemide 20 MG tablet Commonly known as:  LASIX Take 1 tablet (20 mg total) by mouth daily.   levothyroxine 50 MCG tablet Commonly known as:  SYNTHROID, LEVOTHROID Take 50 mcg by mouth daily.   nitroGLYCERIN 0.4 MG SL tablet Commonly known as:  NITROSTAT Place 1 tablet (0.4 mg total) under the tongue every  5 (five) minutes x 3 doses as needed for chest pain.   pantoprazole 40 MG tablet Commonly known as:  PROTONIX Take 40 mg by mouth 2 (two) times daily.   potassium chloride 10 MEQ tablet Commonly known as:  K-DUR Take 1 tablet (10 mEq total) by mouth daily.   tamsulosin 0.4 MG Caps capsule Commonly known as:  FLOMAX Take 0.4 mg by mouth daily.      Follow-up Information    [facility] Follow up.   Why:  Office to provide Physical Therapy- Office to call and set up time to visit.  Contact information: [address], [place] [address] [phone]         No Known Allergies  Consultations:  Cardiology   Procedures/Studies: Dg Chest 2 View  Result Date: 04/01/2018 CLINICAL DATA:  84 year old male congestive  heart failure. Subsequent encounter. EXAM: CHEST - 2 VIEW COMPARISON:  03/31/2018 chest x-ray. FINDINGS: Mild pulmonary edema with small pleural effusions. Left-sided pleural effusion slightly smaller. Opacity right middle lobe may represent pleural fluid or atelectasis, cannot entirely exclude subtle infiltrate. Mild cardiomegaly. Coronary artery calcifications. Calcified aorta. No acute osseous abnormality. IMPRESSION: 1. Mild pulmonary edema with small pleural effusions. Left pleural effusion slightly smaller. 2. Right middle lobe opacity may represent pleural fluid or atelectasis, cannot entirely exclude a subtle infiltrate. 3. Mild cardiomegaly with coronary artery calcifications. 4.  Aortic Atherosclerosis (ICD10-I70.0). Electronically Signed   By: Genia Del M.D.   On: 04/01/2018 07:02   US Renal  Result Date: 04/02/2018 CLINICAL DATA:  Acute kidney injury.  Increased BUN and creatinine. EXAM: RENAL / URINARY TRACT ULTRASOUND COMPLETE COMPARISON:  CT abdomen and pelvis 01/05/2014 FINDINGS: Right Kidney: Length: 15.1 cm. Multiple simple appearing renal cysts, similar to previous CT. No hydronephrosis. Normal parenchymal echotexture. Left Kidney:  Length: 11.2 cm. Multiple simple appearing renal cysts, similar to prior CT. No hydronephrosis. Normal parenchymal echotexture. Bladder: Bladder is decompressed with a Foley catheter. IMPRESSION: Bilateral benign-appearing renal cysts. No hydronephrosis. Foley catheter decompresses the bladder. Electronically Signed   By: Lucienne Capers M.D.   On: 04/02/2018 03:56   Dg Chest Port 1 View  Result Date: 03/31/2018 CLINICAL DATA:  84 y/o M; feet and ankles swollen. Shortness of breath. EXAM: PORTABLE CHEST 1 VIEW COMPARISON:  01/13/2018 chest radiograph. FINDINGS: Stable normal cardiac silhouette given projection and technique. Aortic atherosclerosis with calcification. Increased reticular and peripheral linear opacities of the lungs. No consolidation or pneumothorax. Possible small left effusion. No acute osseous abnormality is evident. IMPRESSION: Interstitial opacities, likely edema.  Possible small left effusion. Electronically Signed   By: Kristine Garbe M.D.   On: 03/31/2018 04:33       Subjective: Feeling well.  No dyspnea on exertion, no orthopnea, no leg swelling, no palpitation, no chest pain  Discharge Exam: Vitals:   04/05/18 0509 04/05/18 0925  BP: (!) 121/47 126/64  Pulse: 60   Resp:  15  Temp: 97.7 F (36.5 C)   SpO2: 98%    Vitals:   04/04/18 2131 04/05/18 0500 04/05/18 0509 04/05/18 0925  BP: 138/62  (!) 121/47 126/64  Pulse: 69  60   Resp:    15  Temp: 98.3 F (36.8 C)  97.7 F (36.5 C)   TempSrc: Oral  Oral   SpO2: 99%  98%   Weight:  74.6 kg    Height:        General: Pt is alert, awake, not in acute distress, lying in bed, arouses easily Cardiovascular: RRR, S1/S2 +, no rubs, no gallops Respiratory: CTA bilaterally, no wheezing, no rhonchi Abdominal: Soft, NT, ND, bowel sounds + Extremities: no edema, no cyanosis    The results of significant diagnostics from this hospitalization (including imaging, microbiology, ancillary and laboratory) are  listed below for reference.     Microbiology: Recent Results (from the past 240 hour(s))  Culture, Urine     Status: Abnormal   Collection Time: 04/01/18 11:42 PM  Result Value Ref Range Status   Specimen Description URINE, CLEAN CATCH  Final   Special Requests Normal  Final   Culture (A)  Final    <10,000 COLONIES/mL INSIGNIFICANT GROWTH Performed at [facility], [address] [place], [place] [address]    Report Status 04/03/2018 FINAL  Final     Labs: BNP (last 3 results) Recent  Labs    03/31/18 0411  BNP 793.9*   Basic Metabolic Panel: Recent Labs  Lab 04/01/18 0442 04/02/18 0558 04/03/18 0445 04/04/18 0336 04/05/18 0538  NA 136 138 136 139 140  K 4.9 4.6 4.6 4.5 4.5  CL 105 105 102 108 105  CO2 22 24 25 24 24   GLUCOSE 173* 109* 117* 123* 115*  BUN 45* 54* 64* 63* 64*  CREATININE 2.28* 2.35* 2.58* 2.36* 2.28*  CALCIUM 8.8* 8.7* 8.5* 8.4* 8.7*   Liver Function Tests: Recent Labs  Lab 04/01/18 0442  AST 21  ALT 17  ALKPHOS 52  BILITOT 0.6  PROT 6.7  ALBUMIN 3.2*   No results for input(s): LIPASE, AMYLASE in the last 168 hours. No results for input(s): AMMONIA in the last 168 hours. CBC: Recent Labs  Lab 03/31/18 0349 03/31/18 1029 04/01/18 0442 04/01/18 1446 04/02/18 0558 04/04/18 0336  WBC 13.9* 9.6 10.2  --  9.3 6.8  NEUTROABS 10.0*  --  8.0*  --   --   --   HGB 9.0* 8.6* 8.2*  --  8.2* 8.2*  HCT 29.4* 27.4* 25.7* 25.3* 25.2* 25.2*  MCV 107.3* 102.2* 101.6*  --  101.2* 99.2  PLT 203 177 204  --  206 199   Cardiac Enzymes: No results for input(s): CKTOTAL, CKMB, CKMBINDEX, TROPONINI in the last 168 hours. BNP: Invalid input(s): POCBNP CBG: No results for input(s): GLUCAP in the last 168 hours. D-Dimer No results for input(s): DDIMER in the last 72 hours. Hgb A1c No results for input(s): HGBA1C in the last 72 hours. Lipid Profile No results for input(s): CHOL, HDL, LDLCALC, TRIG, CHOLHDL, LDLDIRECT in the last 72  hours. Thyroid function studies No results for input(s): TSH, T4TOTAL, T3FREE, THYROIDAB in the last 72 hours.  Invalid input(s): FREET3 Anemia work up No results for input(s): VITAMINB12, FOLATE, FERRITIN, TIBC, IRON, RETICCTPCT in the last 72 hours. Urinalysis    Component Value Date/Time   COLORURINE YELLOW 04/01/2018 0252   APPEARANCEUR CLOUDY (A) 04/01/2018 0252   LABSPEC 1.013 04/01/2018 0252   PHURINE 5.0 04/01/2018 0252   GLUCOSEU NEGATIVE 04/01/2018 0252   HGBUR LARGE (A) 04/01/2018 0252   BILIRUBINUR NEGATIVE 04/01/2018 0252   KETONESUR NEGATIVE 04/01/2018 0252   PROTEINUR 100 (A) 04/01/2018 0252   NITRITE NEGATIVE 04/01/2018 0252   LEUKOCYTESUR SMALL (A) 04/01/2018 0252   Sepsis Labs Invalid input(s): PROCALCITONIN,  WBC,  LACTICIDVEN Microbiology Recent Results (from the past 240 hour(s))  Culture, Urine     Status: Abnormal   Collection Time: 04/01/18 11:42 PM  Result Value Ref Range Status   Specimen Description URINE, CLEAN CATCH  Final   Special Requests Normal  Final   Culture (A)  Final    <10,000 COLONIES/mL INSIGNIFICANT GROWTH Performed at [facility], [address] [place], [place] [address]    Report Status 04/03/2018 FINAL  Final     Time coordinating discharge: 20 minutes      SIGNED:   Edwin Dada, MD  [facility] 04/05/2018, 10:25 AM

## 2018-04-16 NOTE — Telephone Encounter (Signed)
I reviewed the chart, I just recently met Randy Fitzpatrick in clinic.  I reviewed the discharge summary as well as cardiology consultation by Dr. Marlou Porch.  He has CKD stage III-IV at baseline, the creatinine of 2.5 is higher than at hospital discharge.  For now would try Lasix 20 mg every other day.  Suggest follow-up BMET with PCP in about 2 weeks.  Depending on daily weights, he may actually be able to go to as needed use of Lasix rather than standing.

## 2018-04-24 NOTE — Progress Notes (Signed)
Cardiology Office Note  Date: 04/25/2018   ID: Randy Bunting., DOB 08/25/1924, MRN [id number]  PCP: Ranae Palms, MD  Primary Cardiologist: [name], MD   Chief Complaint  Patient presents with  . Coronary Artery Disease    History of Present Illness: Randy Mort. is a 84 y.o. male that I met in July.  He is here for a routine visit. Interval records indicate hospitalization in early October with acute on chronic systolic heart failure.  Patient improved with IV diuresis although did have acute on chronic renal insufficiency with creatinine up to 2.3.  Lasix was to be cut back to every other day, but he tells me that he has been taking it every day since hospital discharge.  He has continued on remaining cardiac regimen and follow-up echocardiogram revealed LVEF approximately 40%.  He has home health nursing and PT twice a week.  He did bring in records, his weight is trended down about 5 pounds.  He states that he is breathing more easily, he is not having any angina symptoms.  Still has generalized fatigue but thinks he has made some progress.  I reviewed his medications which include aspirin, Coreg, Lipitor, Plavix, Lasix, potassium supplements, and as needed nitroglycerin.  Past Medical History:  Diagnosis Date  . Acute ST elevation myocardial infarction (STEMI) of inferior wall (HCC) 11/03/2017   PCI/DESx3 to RCA after wire dissection. EF 45-50%  . BPH (benign prostatic hyperplasia)   . CAD (coronary artery disease)    Multivessel/left main disease  . Hypothyroidism   . Mitral regurgitation   . Mixed hyperlipidemia   . Type 2 diabetes mellitus ([place])     Past Surgical History:  Procedure Laterality Date  . CORONARY STENT INTERVENTION N/A 11/03/2017   Procedure: CORONARY STENT INTERVENTION;  Surgeon: Lorretta Harp, MD;  Location: [facility];  Service: Cardiovascular;  Laterality: N/A;  . CORONARY/GRAFT ACUTE MI REVASCULARIZATION N/A 11/03/2017    Procedure: Coronary/Graft Acute MI Revascularization;  Surgeon: Lorretta Harp, MD;  Location: [facility];  Service: Cardiovascular;  Laterality: N/A;  . LEFT HEART CATH AND CORONARY ANGIOGRAPHY N/A 11/03/2017   Procedure: LEFT HEART CATH AND CORONARY ANGIOGRAPHY;  Surgeon: Lorretta Harp, MD;  Location: [facility];  Service: Cardiovascular;  Laterality: N/A;  . STOMACH SURGERY     Treatment of an abscess    Current Outpatient Medications  Medication Sig Dispense Refill  . aspirin EC 81 MG EC tablet Take 1 tablet (81 mg total) by mouth daily.    Marland Kitchen atorvastatin (LIPITOR) 80 MG tablet TAKE 1 TABLET BY MOUTH DAILY AT 6 PM. (Patient taking differently: Take 80 mg by mouth daily at 6 PM. ) 30 tablet 3  . carvedilol (COREG) 3.125 MG tablet Take 1 tablet (3.125 mg total) by mouth 2 (two) times daily with a meal. 60 tablet 3  . clopidogrel (PLAVIX) 75 MG tablet Take 1 tablet (75 mg total) by mouth daily. 90 tablet 3  . feeding supplement, ENSURE ENLIVE, (ENSURE ENLIVE) LIQD Take 237 mLs by mouth 2 (two) times daily between meals. 237 mL 12  . finasteride (PROSCAR) 5 MG tablet Take 5 mg by mouth daily.    . furosemide (LASIX) 20 MG tablet Take 1 tablet (20 mg total) by mouth daily. 30 tablet 1  . levothyroxine (SYNTHROID, LEVOTHROID) 50 MCG tablet Take 50 mcg by mouth daily.     . nitroGLYCERIN (NITROSTAT) 0.4 MG SL  tablet Place 1 tablet (0.4 mg total) under the tongue every 5 (five) minutes x 3 doses as needed for chest pain. 25 tablet 1  . pantoprazole (PROTONIX) 40 MG tablet Take 40 mg by mouth 2 (two) times daily.    . potassium chloride (K-DUR) 10 MEQ tablet Take 1 tablet (10 mEq total) by mouth daily. 30 tablet 1  . tamsulosin (FLOMAX) 0.4 MG CAPS capsule Take 0.4 mg by mouth daily.      No current facility-administered medications for this visit.    Allergies:  Patient has no known allergies.   Social History: The patient  reports that he quit smoking about 35 years  ago. His smoking use included cigarettes. He has a 40.00 pack-year smoking history. He has never used smokeless tobacco. He reports that he does not drink alcohol or use drugs.   ROS:  Please see the history of present illness. Otherwise, complete review of systems is positive for hearing loss.  All other systems are reviewed and negative.   Physical Exam: VS:  BP (!) 145/56   Pulse 63   Ht _0  (1.778 m)   Wt 160 lb 3.2 oz (72.7 kg)   SpO2 100%   BMI 22.99 kg/m , BMI Body mass index is 22.99 kg/m.  Wt Readings from Last 3 Encounters:  04/25/18 160 lb 3.2 oz (72.7 kg)  04/05/18 164 lb 7.4 oz (74.6 kg)  01/23/18 172 lb (78 kg)    General: Elderly male, appears comfortable at rest. HEENT: Conjunctiva and lids normal, oropharynx clear. Neck: Supple, no elevated JVP or carotid bruits, no thyromegaly. Lungs: Clear to auscultation, nonlabored breathing at rest. Cardiac: Regular rate and rhythm, no S3, 2/6 systolic murmur. Abdomen: Soft, nontender, bowel sounds present. Extremities: No pitting edema, distal pulses 2+. Skin: Warm and dry. Musculoskeletal: No kyphosis. Neuropsychiatric: Alert and oriented x3, affect grossly appropriate.  ECG: I personally reviewed the tracing from 03/31/2018 which showed sinus tachycardia with IVCD and probable old inferior infarct pattern.  Recent Labwork: 03/31/2018: B Natriuretic Peptide 650.5 04/01/2018: ALT 17; AST 21 04/04/2018: Hemoglobin 8.2; Platelets 199 04/05/2018: BUN 64; Creatinine, Ser 2.28; Potassium 4.5; Sodium 140     Component Value Date/Time   CHOL 224 (H) 11/04/2017 0242   TRIG 263 (H) 11/04/2017 0242   HDL 32 (L) 11/04/2017 0242   CHOLHDL 7.0 11/04/2017 0242   VLDL 53 (H) 11/04/2017 0242   LDLCALC 139 (H) 11/04/2017 0242    Other Studies Reviewed Today:  Echocardiogram 04/01/2018: Study Conclusions  - Left ventricle: The cavity size was normal. Wall thickness was   increased in a pattern of mild LVH. Akinesis of the  inferior   wall. Inferolateral hypokinesis. The estimated ejection fraction   was 40%. Features are consistent with a pseudonormal left   ventricular filling pattern, with concomitant abnormal relaxation   and increased filling pressure (grade 2 diastolic dysfunction). - Aortic valve: There was trivial regurgitation. - Aorta: Mildly dilated ascending aorta, 3.7 cm. - Mitral valve: Moderately calcified annulus. Mildly calcified   leaflets . There was no evidence for stenosis. There was moderate   regurgitation, suspect infarct-related with   inferior/inferolateral wall motion abnormalities and restriction   of posterior leaflet. Mean gradient (D): 5 mm Hg. Valve area by   pressure half-time: 2.65 cm^2. - Left atrium: The atrium was mildly dilated. - Right ventricle: The cavity size was normal. Systolic function   was normal. - Right atrium: The atrium was mildly dilated. - Tricuspid valve: Peak  RV-RA gradient (S): 44 mm Hg. - Pulmonary arteries: PA peak pressure: 52 mm Hg (S). - Systemic veins: IVC measured 2.1 cm with > 50% respirophasic   variation, suggesting RA pressure 8 mmHg. - Pericardium, extracardiac: Cystic structures noted in liver. A   trivial pericardial effusion was identified.  Impressions:  - Normal LV size with mild LV hypertrophy, EF 40%. Inferior   akinesis, inferolateral hypokinesis. Moderate diastolic   dysfunction. Normal RV size and systolic function. Moderate   mitral regurgitation, suspect infarct-related. Moderate pulmonary   hypertension.  Assessment and Plan:  1.  Multivessel/left main CAD with history of inferior STEMI in May of this year and status post DES x3 to the RCA.  He is being managed medically otherwise and does not report any active angina at this time on antiplatelet and statin therapy.  He remains on dual antiplatelet regimen.  2.  Ischemic cardiomyopathy with LVEF approximately 40% and inferolateral wall motion abnormalities consistent  with his infarct.  He is being managed for chronic systolic heart failure. He has continued on Lasix 20 mg daily following hospital discharge, although dose had been recommended to be reduced based on renal insufficiency.  His last creatinine was 2.28.  Plan to obtain follow-up BMET and then determine dose of Lasix.  He is clinically stable and his weight is down about 5 pounds.  3.  History of GI bleed with duodenal ulcer.  Last hemoglobin 8.2.  4.  Mixed hyperlipidemia, continues on Lipitor.  Current medicines were reviewed with the patient today.   Orders Placed This Encounter  Procedures  . Basic metabolic panel    Disposition: Follow-up in 3 months.  Signed, Satira Sark, MD, [facility] 04/25/2018 2:09 PM    [facility] [place], [place], [place] [address] Phone: [phone]; Fax: [phone]

## 2018-04-25 ENCOUNTER — Ambulatory Visit (INDEPENDENT_AMBULATORY_CARE_PROVIDER_SITE_OTHER): Payer: Medicare Other | Admitting: Cardiology

## 2018-04-25 ENCOUNTER — Encounter: Payer: Self-pay | Admitting: Cardiology

## 2018-04-25 VITALS — BP 145/56 | HR 63 | Ht 70.0 in | Wt 160.2 lb

## 2018-04-25 DIAGNOSIS — Z8719 Personal history of other diseases of the digestive system: Secondary | ICD-10-CM

## 2018-04-25 DIAGNOSIS — I5022 Chronic systolic (congestive) heart failure: Secondary | ICD-10-CM | POA: Diagnosis not present

## 2018-04-25 DIAGNOSIS — E782 Mixed hyperlipidemia: Secondary | ICD-10-CM

## 2018-04-25 DIAGNOSIS — I255 Ischemic cardiomyopathy: Secondary | ICD-10-CM

## 2018-04-25 DIAGNOSIS — I25119 Atherosclerotic heart disease of native coronary artery with unspecified angina pectoris: Secondary | ICD-10-CM | POA: Diagnosis not present

## 2018-04-25 NOTE — Patient Instructions (Addendum)
Medication Instructions:   Continue Lasix at 20mg  daily for now.  Continue all other medications.    Labwork:  BMET - order given today  Office will contact with results via phone or letter.    Testing/Procedures: none  Follow-Up: 3 months   Any Other Special Instructions Will Be Listed Below (If Applicable).  If you need a refill on your cardiac medications before your next appointment, please call your pharmacy.

## 2018-04-30 ENCOUNTER — Encounter: Payer: Self-pay | Admitting: *Deleted

## 2018-05-01 ENCOUNTER — Telehealth: Payer: Self-pay | Admitting: Cardiology

## 2018-05-01 ENCOUNTER — Telehealth: Payer: Self-pay | Admitting: *Deleted

## 2018-05-01 DIAGNOSIS — I5022 Chronic systolic (congestive) heart failure: Secondary | ICD-10-CM

## 2018-05-01 DIAGNOSIS — I10 Essential (primary) hypertension: Secondary | ICD-10-CM

## 2018-05-01 MED ORDER — FUROSEMIDE 20 MG PO TABS: 20.0000 mg | ORAL_TABLET | ORAL | 1 refills | Status: DC

## 2018-05-01 NOTE — Telephone Encounter (Signed)
-----   Message from Satira Sark, MD sent at 05/01/2018 11:55 AM EST ----- Results reviewed.  BUN and creatinine abnormal at 53 and 2.63 respectively.  Prior creatinine was 2.2.  Would suggest that he try to take his Lasix every other day, continue to follow daily weight. A copy of this test should be forwarded to [facility], Barry Dienes, MD.

## 2018-05-01 NOTE — Telephone Encounter (Signed)
[name] has questions about her father's lab results

## 2018-05-01 NOTE — Telephone Encounter (Signed)
Patient informed and verbalized understanding of plan. Lab order for repeat potassium level faxed to PCP office per patient request.

## 2018-05-01 NOTE — Telephone Encounter (Signed)
-----   Message from Satira Sark, MD sent at 05/01/2018 11:55 AM EST ----- Results reviewed.  BUN and creatinine abnormal at 53 and 2.63 respectively.  Prior creatinine was 2.2.  Would suggest that he try to take his Lasix every other day, continue to follow daily weight. A copy of this test should be forwarded to [facility], Barry Dienes, MD.

## 2018-05-01 NOTE — Telephone Encounter (Signed)
Daughter informed of lab test results and plan. Verbalized understanding of plan.

## 2018-05-01 NOTE — Telephone Encounter (Signed)
-----   Message from Satira Sark, MD sent at 05/01/2018 11:56 AM EST ----- Results reviewed.  Additional lab work received with creatinine 2.58.  Last check was 2.6.  A copy of this test should be forwarded to [facility], Barry Dienes, MD.

## 2018-05-03 ENCOUNTER — Telehealth: Payer: Self-pay | Admitting: *Deleted

## 2018-05-03 NOTE — Telephone Encounter (Signed)
-----   Message from Satira Sark, MD sent at 05/02/2018 12:55 PM EST ----- Results reviewed.  Creatinine up to 2.93 and potassium elevated at 7.1.  Not certain if Dr. Hampton Abbot aware of these numbers or not.  I would suggest that the patient be seen in the ER to get an ECG and make sure that there are no other acute issues that need to be treated, including active management of his hyperkalemia.  As per chart, we had already recommended that he cut back his Lasix. A copy of this test should be forwarded to [facility], Barry Dienes, MD.

## 2018-05-03 NOTE — Telephone Encounter (Signed)
Randy Fitzpatrick daughter of patient informed and verbalized understanding of plan. Per [name], [place]

## 2018-05-06 ENCOUNTER — Telehealth: Payer: Self-pay | Admitting: *Deleted

## 2018-05-06 NOTE — Telephone Encounter (Signed)
Dr Allen Kell nurse called with pt potassium 7.1 per 11/8 phone note pt was to be evaluated at ED which pt did not do and also was still taking potassium - nurse said she would have pt report to ED

## 2018-06-04 ENCOUNTER — Telehealth: Payer: Self-pay | Admitting: *Deleted

## 2018-06-04 NOTE — Telephone Encounter (Signed)
PCP office called requesting recommendations from Dr. Domenic Polite r/e patient's elevated potassium level. Most recent K+ 7.1 on 05/27/18. Lab report scanned into chart.

## 2018-06-05 NOTE — Telephone Encounter (Signed)
Patient's lab work shows renal insufficiency and hyperkalemia, I would suggest that PCP refer this patient to a nephrologist.  Make sure that he is not on any potassium supplementation or other potentially nephrotoxic medications.

## 2018-06-05 NOTE — Telephone Encounter (Signed)
Called and informed [place] at Dr. Allen Kell office. Response faxed to PCP also.

## 2018-07-26 ENCOUNTER — Ambulatory Visit (INDEPENDENT_AMBULATORY_CARE_PROVIDER_SITE_OTHER): Payer: Medicare Other | Admitting: Cardiology

## 2018-07-26 ENCOUNTER — Encounter: Payer: Self-pay | Admitting: Cardiology

## 2018-07-26 VITALS — BP 140/52 | HR 70 | Ht 70.0 in | Wt 160.0 lb

## 2018-07-26 DIAGNOSIS — E782 Mixed hyperlipidemia: Secondary | ICD-10-CM | POA: Diagnosis not present

## 2018-07-26 DIAGNOSIS — N183 Chronic kidney disease, stage 3 unspecified: Secondary | ICD-10-CM

## 2018-07-26 DIAGNOSIS — I255 Ischemic cardiomyopathy: Secondary | ICD-10-CM

## 2018-07-26 DIAGNOSIS — I25119 Atherosclerotic heart disease of native coronary artery with unspecified angina pectoris: Secondary | ICD-10-CM | POA: Diagnosis not present

## 2018-07-26 NOTE — Progress Notes (Signed)
Cardiology Office Note  Date: 07/26/2018   ID: Randy Glascoe., DOB 02/21/1925, MRN [id number]  PCP: Randy Drown, [name], MD  Primary Cardiologist: Randy Lesches, MD   Chief Complaint  Patient presents with  . Coronary Artery Disease    History of Present Illness: Randy Fitzpatrick. is a 85 y.o. male last seen in October 2019.  He is here for a follow-up visit.  From a cardiac perspective he does not report any angina symptoms or increasing shortness of breath, no orthopnea or PND.  His weight is been stable.  He does have renal insufficiency and hyperkalemia, follow-up lab work is outlined below.  He has been evaluated by Dr. Lowanda Fitzpatrick and is now on [facility].  He is not on potassium supplements.  Also no ACE inhibitor or ARB.  Past Medical History:  Diagnosis Date  . Acute ST elevation myocardial infarction (STEMI) of inferior wall (HCC) 11/03/2017   PCI/DESx3 to RCA after wire dissection. EF 45-50%  . BPH (benign prostatic hyperplasia)   . CAD (coronary artery disease)    Multivessel/left main disease  . Hypothyroidism   . Mitral regurgitation   . Mixed hyperlipidemia   . Type 2 diabetes mellitus ([place])     Past Surgical History:  Procedure Laterality Date  . CORONARY STENT INTERVENTION N/A 11/03/2017   Procedure: CORONARY STENT INTERVENTION;  Surgeon: Randy Harp, MD;  Location: [place];  Service: Cardiovascular;  Laterality: N/A;  . CORONARY/GRAFT ACUTE MI REVASCULARIZATION N/A 11/03/2017   Procedure: Coronary/Graft Acute MI Revascularization;  Surgeon: Randy Harp, MD;  Location: [facility] [facility];  Service: Cardiovascular;  Laterality: N/A;  . LEFT HEART CATH AND CORONARY ANGIOGRAPHY N/A 11/03/2017   Procedure: LEFT HEART CATH AND CORONARY ANGIOGRAPHY;  Surgeon: Randy Harp, MD;  Location: [facility];  Service: Cardiovascular;  Laterality: N/A;  . STOMACH SURGERY     Treatment of an abscess    Current Outpatient Medications    Medication Sig Dispense Refill  . aspirin EC 81 MG EC tablet Take 1 tablet (81 mg total) by mouth daily.    Marland Kitchen atorvastatin (LIPITOR) 80 MG tablet TAKE 1 TABLET BY MOUTH DAILY AT 6 PM. (Patient taking differently: Take 80 mg by mouth daily at 6 PM. ) 30 tablet 3  . carvedilol (COREG) 3.125 MG tablet Take 1 tablet (3.125 mg total) by mouth 2 (two) times daily with a meal. 60 tablet 3  . clopidogrel (PLAVIX) 75 MG tablet Take 1 tablet (75 mg total) by mouth daily. 90 tablet 3  . feeding supplement, ENSURE ENLIVE, (ENSURE ENLIVE) LIQD Take 237 mLs by mouth 2 (two) times daily between meals. 237 mL 12  . finasteride (PROSCAR) 5 MG tablet Take 5 mg by mouth daily.    . furosemide (LASIX) 20 MG tablet Take 1 tablet (20 mg total) by mouth every other day. 30 tablet 1  . Iron-FA-B Cmp-C-Biot-Probiotic (FUSION PLUS) CAPS Take by mouth.    . levothyroxine (SYNTHROID, LEVOTHROID) 50 MCG tablet Take 50 mcg by mouth daily.     . nitroGLYCERIN (NITROSTAT) 0.4 MG SL tablet Place 1 tablet (0.4 mg total) under the tongue every 5 (five) minutes x 3 doses as needed for chest pain. 25 tablet 1  . pantoprazole (PROTONIX) 40 MG tablet Take 40 mg by mouth 2 (two) times daily.    . sodium zirconium cyclosilicate (LOKELMA) 5 g packet Take 5 g by mouth.    Marland Kitchen  tamsulosin (FLOMAX) 0.4 MG CAPS capsule Take 0.4 mg by mouth daily.      No current facility-administered medications for this visit.    Allergies:  Patient has no known allergies.   Social History: The patient  reports that he quit smoking about 36 years ago. His smoking use included cigarettes. He has a 40.00 pack-year smoking history. He has never used smokeless tobacco. He reports that he does not drink alcohol or use drugs.   ROS:  Please see the history of present illness. Otherwise, complete review of systems is positive for hearing loss.  All other systems are reviewed and negative.   Physical Exam: VS:  BP (!) 140/52   Pulse 70   Ht 5\' 10"  (1.778 m)    Wt 160 lb (72.6 kg)   SpO2 (!) 87%   BMI 22.96 kg/m , BMI Body mass index is 22.96 kg/m.  Wt Readings from Last 3 Encounters:  07/26/18 160 lb (72.6 kg)  04/25/18 160 lb 3.2 oz (72.7 kg)  04/05/18 164 lb 7.4 oz (74.6 kg)    General: Elderly male, appears comfortable at rest. HEENT: Conjunctiva and lids normal, oropharynx clear. Neck: Supple, no elevated JVP or carotid bruits, no thyromegaly. Lungs: Clear to auscultation, nonlabored breathing at rest. Cardiac: Regular rate and rhythm, no S3, 2/6 systolic murmur. Abdomen: Soft, nontender, bowel sounds present. Extremities: No pitting edema, distal pulses 2+. Skin: Warm and dry. Musculoskeletal: No kyphosis. Neuropsychiatric: Alert and oriented x3, affect grossly appropriate.  ECG: I personally reviewed the tracing from 05/01/2018 which showed sinus tachycardia with old inferior infarct pattern and IVCD.  Recent Labwork: 03/31/2018: B Natriuretic Peptide 650.5 04/01/2018: ALT 17; AST 21 04/04/2018: Hemoglobin 8.2; Platelets 199 04/05/2018: BUN 64; Creatinine, Ser 2.28; Potassium 4.5; Sodium 140     Component Value Date/Time   CHOL 224 (H) 11/04/2017 0242   TRIG 263 (H) 11/04/2017 0242   HDL 32 (L) 11/04/2017 0242   CHOLHDL 7.0 11/04/2017 0242   VLDL 53 (H) 11/04/2017 0242   LDLCALC 139 (H) 11/04/2017 0242  December 2019: Hemoglobin 7.6, platelets 214, BUN 50, creatinine 2.56, potassium 7.1  Other Studies Reviewed Today:  Echocardiogram 04/01/2018: Study Conclusions  - Left ventricle: The cavity size was normal. Wall thickness was   increased in a pattern of mild LVH. Akinesis of the inferior   wall. Inferolateral hypokinesis. The estimated ejection fraction   was 40%. Features are consistent with a pseudonormal left   ventricular filling pattern, with concomitant abnormal relaxation   and increased filling pressure (grade 2 diastolic dysfunction). - Aortic valve: There was trivial regurgitation. - Aorta: Mildly dilated  ascending aorta, 3.7 cm. - Mitral valve: Moderately calcified annulus. Mildly calcified   leaflets . There was no evidence for stenosis. There was moderate   regurgitation, suspect infarct-related with   inferior/inferolateral wall motion abnormalities and restriction   of posterior leaflet. Mean gradient (D): 5 mm Hg. Valve area by   pressure half-time: 2.65 cm^2. - Left atrium: The atrium was mildly dilated. - Right ventricle: The cavity size was normal. Systolic function   was normal. - Right atrium: The atrium was mildly dilated. - Tricuspid valve: Peak RV-RA gradient (S): 44 mm Hg. - Pulmonary arteries: PA peak pressure: 52 mm Hg (S). - Systemic veins: IVC measured 2.1 cm with > 50% respirophasic   variation, suggesting RA pressure 8 mmHg. - Pericardium, extracardiac: Cystic structures noted in liver. A   trivial pericardial effusion was identified.  Impressions:  - Normal  LV size with mild LV hypertrophy, EF 40%. Inferior   akinesis, inferolateral hypokinesis. Moderate diastolic   dysfunction. Normal RV size and systolic function. Moderate   mitral regurgitation, suspect infarct-related. Moderate pulmonary   hypertension.  Assessment and Plan:  1.  Multivessel/left main CAD with inferior STEMI in May 2019 status post DES x3 to the RCA.  He has otherwise been managed medically and at this point reports no active angina symptoms.  Continue antiplatelet therapy and statin.  2.  Ischemic cardiomyopathy with LVEF 40%.  He remains on low-dose Lasix, no potassium supplements.  Weight is stable.  3.  Renal insufficiency, CKD stage III-IV with hyperkalemia.  Now following with Dr. Lowanda Fitzpatrick.  Not on ACE inhibitor or ARB, no potassium supplements.  He is on Lokelma.  4.  Mixed hyperlipidemia, on Lipitor.  Current medicines were reviewed with the patient today.  Disposition: Follow-up in 4 months.  Signed, Satira Sark, MD, [facility] 07/26/2018 1:46 PM    [facility] [place], [place], [place] [address] Phone: [phone]; Fax: [phone]

## 2018-07-26 NOTE — Patient Instructions (Addendum)
Medication Instructions:   Your physician recommends that you continue on your current medications as directed. Please refer to the Current Medication list given to you today.  Labwork:  NONE  Testing/Procedures:  NONE  Follow-Up:  Your physician recommends that you schedule a follow-up appointment in: 4 months.  Any Other Special Instructions Will Be Listed Below (If Applicable).  If you need a refill on your cardiac medications before your next appointment, please call your pharmacy. 

## 2018-08-06 ENCOUNTER — Encounter: Payer: Self-pay | Admitting: Hematology

## 2018-08-12 ENCOUNTER — Other Ambulatory Visit: Payer: Self-pay | Admitting: *Deleted

## 2018-08-12 MED ORDER — CARVEDILOL 3.125 MG PO TABS: 3.1250 mg | ORAL_TABLET | Freq: Two times a day (BID) | ORAL | 6 refills | Status: DC

## 2018-08-12 MED ORDER — ATORVASTATIN CALCIUM 80 MG PO TABS: 80.0000 mg | ORAL_TABLET | Freq: Every day | ORAL | 6 refills | Status: DC

## 2018-08-30 ENCOUNTER — Encounter: Payer: Self-pay | Admitting: *Deleted

## 2018-08-30 ENCOUNTER — Telehealth: Payer: Self-pay | Admitting: Cardiology

## 2018-08-30 NOTE — Telephone Encounter (Signed)
Pt says was at [facility] 2 weeks ago and was told to stop lasix and doesn't know why, pt went to Dr Romilda Garret office and was told to contact our office - pt was on lasix 20 mg every other day but has not taken it since he was discharged from hospital - will request records and forward to provider

## 2018-08-30 NOTE — Telephone Encounter (Signed)
DR Gurney Maxin office  called asking for the patient that was in their office when the  patient should start back on his Lasix. Please call patient back on [place] office

## 2018-08-30 NOTE — Telephone Encounter (Signed)
Agree with obtaining records.  I suspect the issue was concern about renal insufficiency and hyperkalemia which is a known issue for him.  He follows with Dr. Lowanda Foster and has been on a potassium lowering agent.

## 2018-08-30 NOTE — Telephone Encounter (Signed)
Received records and scheduled appt with Dr Domenic Polite on 3/11 - pt had echo/CXR done and was admitted for congestive heart failure with exacerbation/anemia  and recommended cardiology appt

## 2018-09-04 ENCOUNTER — Other Ambulatory Visit: Payer: Self-pay

## 2018-09-04 ENCOUNTER — Encounter: Payer: Self-pay | Admitting: Cardiology

## 2018-09-04 ENCOUNTER — Ambulatory Visit (INDEPENDENT_AMBULATORY_CARE_PROVIDER_SITE_OTHER): Payer: Medicare Other | Admitting: Cardiology

## 2018-09-04 VITALS — BP 138/60 | HR 57 | Ht 70.0 in | Wt 162.0 lb

## 2018-09-04 DIAGNOSIS — I255 Ischemic cardiomyopathy: Secondary | ICD-10-CM

## 2018-09-04 DIAGNOSIS — D649 Anemia, unspecified: Secondary | ICD-10-CM | POA: Diagnosis not present

## 2018-09-04 DIAGNOSIS — N184 Chronic kidney disease, stage 4 (severe): Secondary | ICD-10-CM | POA: Diagnosis not present

## 2018-09-04 DIAGNOSIS — I25119 Atherosclerotic heart disease of native coronary artery with unspecified angina pectoris: Secondary | ICD-10-CM

## 2018-09-04 NOTE — Patient Instructions (Addendum)
Medication Instructions:  Continue all current medications.   Follow-Up: 6 weeks   Any Other Special Instructions Will Be Listed Below (If Applicable).  If you need a refill on your cardiac medications before your next appointment, please call your pharmacy.

## 2018-09-04 NOTE — Progress Notes (Signed)
Cardiology Office Note  Date: 09/04/2018   ID: Randy Bunting., DOB [birth year], MRN [id number]  PCP: [name], Jessamyn Watterson Germany, MD  Primary Cardiologist: [name], MD   Chief Complaint  Patient presents with  . Hospitalization Follow-up    History of Present Illness: Randy Cassetta. is a 85 y.o. male last seen in January.  He presents back to the office after interval hospital stay at [facility] in late February.  He presented with shortness of breath and weight gain, was found to be severely anemic with hemoglobin 6.6 and with progressive renal insufficiency, creatinine of 2.7.  BNP was elevated at 8427 and his chest x-ray demonstrated interstitial pulmonary edema.  He also had an ill-defined 19 mm nodule in the right upper lobe (new since October 2019).  Troponin T levels were not consistent with ACS.  He was transfused 2 units of packed red cells with follow-up hemoglobin up to 9.3.  Creatinine went to 2.95 and Lasix was discontinued.  I personally reviewed his ECG from February 21 which showed sinus rhythm with mild PR prolongation and IVCD.  He also underwent an echocardiogram which reported mild LVH with LVEF 45 to 50%, inferoposterior wall motion abnormality, grade 1 diastolic dysfunction, aortic valve sclerosis without stenosis, and mild mitral regurgitation.  He states that he saw his PCP since hospital discharge and has follow-up later this month with repeat CBC.  There was some discussion about whether he might need an additional PRBC transfusion as an outpatient.  He states that he has dark stools but no obvious bleeding, is on iron supplements at the same time however.  I have talked with him about discontinuing Plavix in May and otherwise trying to stay on low-dose aspirin.  Otherwise I reviewed his medications and would agree with stopping Lasix on a standing basis, this could be used as needed for weight gain of 2-3 pounds in 24 hours or 5 pounds in a  week.  He also tells me that he is no longer on Lokelma.  Past Medical History:  Diagnosis Date  . Acute ST elevation myocardial infarction (STEMI) of inferior wall (HCC) 11/03/2017   PCI/DESx3 to RCA after wire dissection. EF 45-50%  . BPH (benign prostatic hyperplasia)   . CAD (coronary artery disease)    Multivessel/left main disease  . Hypothyroidism   . Mitral regurgitation   . Mixed hyperlipidemia   . Type 2 diabetes mellitus ([place])     Past Surgical History:  Procedure Laterality Date  . CORONARY STENT INTERVENTION N/A 11/03/2017   Procedure: CORONARY STENT INTERVENTION;  Surgeon: Lorretta Harp, MD;  Location: [place];  Service: Cardiovascular;  Laterality: N/A;  . CORONARY/GRAFT ACUTE MI REVASCULARIZATION N/A 11/03/2017   Procedure: Coronary/Graft Acute MI Revascularization;  Surgeon: Lorretta Harp, MD;  Location: [place] [facility];  Service: Cardiovascular;  Laterality: N/A;  . LEFT HEART CATH AND CORONARY ANGIOGRAPHY N/A 11/03/2017   Procedure: LEFT HEART CATH AND CORONARY ANGIOGRAPHY;  Surgeon: Lorretta Harp, MD;  Location: [place] [facility];  Service: Cardiovascular;  Laterality: N/A;  . STOMACH SURGERY     Treatment of an abscess    Current Outpatient Medications  Medication Sig Dispense Refill  . aspirin EC 81 MG EC tablet Take 1 tablet (81 mg total) by mouth daily.    Marland Kitchen atorvastatin (LIPITOR) 80 MG tablet Take 1 tablet (80 mg total) by mouth daily. 30 tablet 6  . carvedilol (  COREG) 3.125 MG tablet Take 1 tablet (3.125 mg total) by mouth 2 (two) times daily with a meal. 60 tablet 6  . clopidogrel (PLAVIX) 75 MG tablet Take 1 tablet (75 mg total) by mouth daily. 90 tablet 3  . feeding supplement, ENSURE ENLIVE, (ENSURE ENLIVE) LIQD Take 237 mLs by mouth 2 (two) times daily between meals. 237 mL 12  . finasteride (PROSCAR) 5 MG tablet Take 5 mg by mouth daily.    . Iron-FA-B Cmp-C-Biot-Probiotic (FUSION PLUS) CAPS Take by mouth.    . levothyroxine  (SYNTHROID, LEVOTHROID) 50 MCG tablet Take 50 mcg by mouth daily.     . nitroGLYCERIN (NITROSTAT) 0.4 MG SL tablet Place 1 tablet (0.4 mg total) under the tongue every 5 (five) minutes x 3 doses as needed for chest pain. 25 tablet 1  . pantoprazole (PROTONIX) 40 MG tablet Take 40 mg by mouth 2 (two) times daily.    . tamsulosin (FLOMAX) 0.4 MG CAPS capsule Take 0.4 mg by mouth daily.      No current facility-administered medications for this visit.    Allergies:  Patient has no known allergies.   Social History: The patient  reports that he quit smoking about 36 years ago. His smoking use included cigarettes. He has a 40.00 pack-year smoking history. He has never used smokeless tobacco. He reports that he does not drink alcohol or use drugs.   ROS:  Please see the history of present illness. Otherwise, complete review of systems is positive for hearing loss.  All other systems are reviewed and negative.   Physical Exam: VS:  BP 138/60   Pulse (!) 57   Ht 5\' 10"  (1.778 m)   Wt 162 lb (73.5 kg)   SpO2 98%   BMI 23.24 kg/m , BMI Body mass index is 23.24 kg/m.  Wt Readings from Last 3 Encounters:  09/04/18 162 lb (73.5 kg)  07/26/18 160 lb (72.6 kg)  04/25/18 160 lb 3.2 oz (72.7 kg)    General: Frail appearing elderly male, no distress. HEENT: Conjunctiva and lids normal, oropharynx clear. Neck: Supple, no elevated JVP or carotid bruits, no thyromegaly. Lungs: Clear to auscultation, nonlabored breathing at rest. Cardiac: Regular rate and rhythm, no S3, 2/6 systolic murmur. Abdomen: Soft, nontender, bowel sounds present. Extremities: No pitting edema, distal pulses 2+. Skin: Warm and dry. Musculoskeletal: Mild kyphosis. Neuropsychiatric: Alert and oriented x3, affect grossly appropriate.  ECG: I personally reviewed the tracing from/11/2017 which showed sinus tachycardia with IVCD and leftward axis.  Recent Labwork: 03/31/2018: B Natriuretic Peptide 650.5 04/01/2018: ALT 17; AST  21 04/04/2018: Hemoglobin 8.2; Platelets 199 04/05/2018: BUN 64; Creatinine, Ser 2.28; Potassium 4.5; Sodium 140     Component Value Date/Time   CHOL 224 (H) 11/04/2017 0242   TRIG 263 (H) 11/04/2017 0242   HDL 32 (L) 11/04/2017 0242   CHOLHDL 7.0 11/04/2017 0242   VLDL 53 (H) 11/04/2017 0242   LDLCALC 139 (H) 11/04/2017 0242    Other Studies Reviewed Today:  Echocardiogram 04/01/2018: Study Conclusions  - Left ventricle: The cavity size was normal. Wall thickness was   increased in a pattern of mild LVH. Akinesis of the inferior   wall. Inferolateral hypokinesis. The estimated ejection fraction   was 40%. Features are consistent with a pseudonormal left   ventricular filling pattern, with concomitant abnormal relaxation   and increased filling pressure (grade 2 diastolic dysfunction). - Aortic valve: There was trivial regurgitation. - Aorta: Mildly dilated ascending aorta, 3.7 cm. - Mitral  valve: Moderately calcified annulus. Mildly calcified   leaflets . There was no evidence for stenosis. There was moderate   regurgitation, suspect infarct-related with   inferior/inferolateral wall motion abnormalities and restriction   of posterior leaflet. Mean gradient (D): 5 mm Hg. Valve area by   pressure half-time: 2.65 cm^2. - Left atrium: The atrium was mildly dilated. - Right ventricle: The cavity size was normal. Systolic function   was normal. - Right atrium: The atrium was mildly dilated. - Tricuspid valve: Peak RV-RA gradient (S): 44 mm Hg. - Pulmonary arteries: PA peak pressure: 52 mm Hg (S). - Systemic veins: IVC measured 2.1 cm with > 50% respirophasic   variation, suggesting RA pressure 8 mmHg. - Pericardium, extracardiac: Cystic structures noted in liver. A   trivial pericardial effusion was identified.  Impressions:  - Normal LV size with mild LV hypertrophy, EF 40%. Inferior   akinesis, inferolateral hypokinesis. Moderate diastolic   dysfunction. Normal RV size  and systolic function. Moderate   mitral regurgitation, suspect infarct-related. Moderate pulmonary   hypertension.  Assessment and Plan:  1.  Multivessel/left main CAD status post inferior STEMI in May 2019 managed with DES x3 to the RCA.  Plan is to stop Plavix as of May, continue low-dose aspirin.  Otherwise continue Lipitor.  He reports no active angina.  2.  Ischemic cardiomyopathy, LVEF has improved to the range of 45 to 50% by recent follow-up echocardiogram at [facility].  Agree with stopping Lasix on a regular basis.  This can be used as needed for weight gain of 2-3 pounds in 24 hours or 5 pounds in 1 week.  3.  CKD stage IV, recent creatinine 2.7-2.9 range.  He has been evaluated by Dr. Lowanda Foster.  Avoiding ACE inhibitor and ARB/ARNI.  He is no longer on Lokelma.  4.  Recurrent, symptomatic anemia.  I suspect that most of his symptoms prompting recent hospital stay were related to anemia, not heart failure.  Agree with follow-up CBC with PCP later this month and consideration of a follow-up outpatient PRBC transfusion if needed.  I am hopeful that this will be less of an issue once he is able to stop Plavix.  Continue iron supplement.  Current medicines were reviewed with the patient today.  Disposition: Follow-up in 6 weeks.  Signed, Satira Sark, MD, [facility] 09/04/2018 11:56 AM    [facility] [place], [place], [place] [address] Phone: [phone]; Fax: [phone]

## 2018-09-24 ENCOUNTER — Ambulatory Visit (INDEPENDENT_AMBULATORY_CARE_PROVIDER_SITE_OTHER): Payer: Medicare Other | Admitting: Internal Medicine

## 2018-09-30 ENCOUNTER — Inpatient Hospital Stay (HOSPITAL_COMMUNITY): Payer: Medicare Other | Attending: Hematology | Admitting: Hematology

## 2018-09-30 ENCOUNTER — Other Ambulatory Visit: Payer: Self-pay

## 2018-09-30 ENCOUNTER — Encounter (HOSPITAL_COMMUNITY): Payer: Self-pay | Admitting: Hematology

## 2018-09-30 ENCOUNTER — Ambulatory Visit (HOSPITAL_COMMUNITY): Payer: Medicare Other

## 2018-09-30 ENCOUNTER — Ambulatory Visit (HOSPITAL_COMMUNITY): Payer: Medicare Other | Admitting: Hematology

## 2018-09-30 ENCOUNTER — Inpatient Hospital Stay (HOSPITAL_COMMUNITY): Payer: Medicare Other

## 2018-09-30 ENCOUNTER — Ambulatory Visit (HOSPITAL_COMMUNITY)
Admission: RE | Admit: 2018-09-30 | Discharge: 2018-09-30 | Disposition: A | Discharge status: Home / Self Care | Payer: Medicare Other | Source: Ambulatory Visit | Attending: Hematology | Admitting: Hematology

## 2018-09-30 VITALS — BP 146/64 | HR 76 | Temp 97.5°F | Resp 16 | Wt 157.9 lb

## 2018-09-30 DIAGNOSIS — N183 Chronic kidney disease, stage 3 (moderate): Secondary | ICD-10-CM | POA: Insufficient documentation

## 2018-09-30 DIAGNOSIS — D649 Anemia, unspecified: Secondary | ICD-10-CM | POA: Diagnosis not present

## 2018-09-30 DIAGNOSIS — D472 Monoclonal gammopathy: Secondary | ICD-10-CM | POA: Insufficient documentation

## 2018-09-30 DIAGNOSIS — Z7982 Long term (current) use of aspirin: Secondary | ICD-10-CM | POA: Diagnosis not present

## 2018-09-30 DIAGNOSIS — Z79899 Other long term (current) drug therapy: Secondary | ICD-10-CM | POA: Diagnosis not present

## 2018-09-30 DIAGNOSIS — E039 Hypothyroidism, unspecified: Secondary | ICD-10-CM | POA: Insufficient documentation

## 2018-09-30 DIAGNOSIS — Z7901 Long term (current) use of anticoagulants: Secondary | ICD-10-CM | POA: Diagnosis not present

## 2018-09-30 DIAGNOSIS — Z87891 Personal history of nicotine dependence: Secondary | ICD-10-CM | POA: Diagnosis not present

## 2018-09-30 DIAGNOSIS — N189 Chronic kidney disease, unspecified: Secondary | ICD-10-CM

## 2018-09-30 DIAGNOSIS — E119 Type 2 diabetes mellitus without complications: Secondary | ICD-10-CM | POA: Insufficient documentation

## 2018-09-30 LAB — RETICULOCYTES
Immature Retic Fract: 11.8 % (ref 2.3–15.9)
RBC.: 2.38 MIL/uL — ABNORMAL LOW (ref 4.22–5.81)
Retic Count, Absolute: 64.7 10*3/uL (ref 19.0–186.0)
Retic Ct Pct: 2.7 % (ref 0.4–3.1)

## 2018-09-30 LAB — IRON AND TIBC
Iron: 62 ug/dL (ref 45–182)
Saturation Ratios: 24 % (ref 17.9–39.5)
TIBC: 259 ug/dL (ref 250–450)
UIBC: 197 ug/dL

## 2018-09-30 LAB — COMPREHENSIVE METABOLIC PANEL
ALT: 24 U/L (ref 0–44)
AST: 21 U/L (ref 15–41)
Albumin: 3.8 g/dL (ref 3.5–5.0)
Alkaline Phosphatase: 68 U/L (ref 38–126)
Anion gap: 8 (ref 5–15)
BUN: 51 mg/dL — ABNORMAL HIGH (ref 8–23)
CO2: 20 mmol/L — ABNORMAL LOW (ref 22–32)
Calcium: 8.6 mg/dL — ABNORMAL LOW (ref 8.9–10.3)
Chloride: 111 mmol/L (ref 98–111)
Creatinine, Ser: 3.05 mg/dL — ABNORMAL HIGH (ref 0.61–1.24)
GFR calc Af Amer: 20 mL/min — ABNORMAL LOW (ref >60)
GFR calc non Af Amer: 17 mL/min — ABNORMAL LOW (ref >60)
Glucose, Bld: 102 mg/dL — ABNORMAL HIGH (ref 70–99)
Potassium: 5.1 mmol/L (ref 3.5–5.1)
Sodium: 139 mmol/L (ref 135–145)
Total Bilirubin: 0.4 mg/dL (ref 0.3–1.2)
Total Protein: 7.3 g/dL (ref 6.5–8.1)

## 2018-09-30 LAB — CBC WITH DIFFERENTIAL/PLATELET
Abs Immature Granulocytes: 0.05 10*3/uL (ref 0.00–0.07)
Basophils Absolute: 0 10*3/uL (ref 0.0–0.1)
Basophils Relative: 0 %
Eosinophils Absolute: 0.3 10*3/uL (ref 0.0–0.5)
Eosinophils Relative: 4 %
HCT: 24.3 % — ABNORMAL LOW (ref 39.0–52.0)
Hemoglobin: 7.7 g/dL — ABNORMAL LOW (ref 13.0–17.0)
Immature Granulocytes: 1 %
Lymphocytes Relative: 21 %
Lymphs Abs: 1.3 10*3/uL (ref 0.7–4.0)
MCH: 32.4 pg (ref 26.0–34.0)
MCHC: 31.7 g/dL (ref 30.0–36.0)
MCV: 102.1 fL — ABNORMAL HIGH (ref 80.0–100.0)
Monocytes Absolute: 0.7 10*3/uL (ref 0.1–1.0)
Monocytes Relative: 10 %
Neutro Abs: 4.2 10*3/uL (ref 1.7–7.7)
Neutrophils Relative %: 64 %
Platelets: 177 10*3/uL (ref 150–400)
RBC: 2.38 MIL/uL — ABNORMAL LOW (ref 4.22–5.81)
RDW: 15.5 % (ref 11.5–15.5)
WBC: 6.5 10*3/uL (ref 4.0–10.5)
nRBC: 0 % (ref 0.0–0.2)

## 2018-09-30 LAB — VITAMIN B12: Vitamin B-12: 497 pg/mL (ref 180–914)

## 2018-09-30 LAB — FERRITIN: Ferritin: 222 ng/mL (ref 24–336)

## 2018-09-30 LAB — FOLATE: Folate: 35 ng/mL (ref >5.9)

## 2018-09-30 LAB — LACTATE DEHYDROGENASE: LDH: 170 U/L (ref 98–192)

## 2018-09-30 NOTE — Patient Instructions (Signed)
[place] at [facility] Discharge Instructions  You were seen today by Dr. Delton Coombes. He went over your family history, history and how you've been feeling. He will see you back in 10 days for follow up.   Thank you for choosing [place] at [facility] to provide your oncology and hematology care.  To afford each patient quality time with our provider, please arrive at least 15 minutes before your scheduled appointment time.   If you have a lab appointment with the [facility] please come in thru the  Main Entrance and check in at the main information desk  You need to re-schedule your appointment should you arrive 10 or more minutes late.  We strive to give you quality time with our providers, and arriving late affects you and other patients whose appointments are after yours.  Also, if you no show three or more times for appointments you may be dismissed from the clinic at the providers discretion.     Again, thank you for choosing [facility].  Our hope is that these requests will decrease the amount of time that you wait before being seen by our physicians.       _____________________________________________________________  Should you have questions after your visit to [facility], please contact our office at [phone] between the hours of 8:00 a.m. and 4:30 p.m.  Voicemails left after 4:00 p.m. will not be returned until the following business day.  For prescription refill requests, have your pharmacy contact our office and allow 72 hours.    Cancer Center Support Programs:   > Cancer Support Group  2nd Tuesday of the month 1pm-2pm, Journey Room

## 2018-09-30 NOTE — Progress Notes (Signed)
CONSULT NOTE  Patient Care Team: [name], [name], MD as PCP - General Domenic Polite Aloha Gell, MD as PCP - Cardiology (Cardiology)  CHIEF COMPLAINTS/PURPOSE OF CONSULTATION:  MGUS  HISTORY OF PRESENTING ILLNESS:  Randy Fitzpatrick. 85 y.o. male is here because of Mspike in 24 hour urine. He was found to have abnormal CBC from [facility]. He is here today alone. He states that he knows that he has blood In his urine. He states that he has been anemic for the last 10 years. He denies recent chest pain on exertion, shortness of breath on minimal exertion, pre-syncopal episodes, or palpitations. He denies any recent falls, uses a cane for ambulation. He had not noticed any recent bleeding such as epistaxis, hematuria or hematochezia. He denies any pain at this time. He states that his weight has been stable since May of last year. The patient denies over the counter. His last colonoscopy was a long time ago, he is unsure of the date. He had no prior history or diagnosis of cancer. His age appropriate screening programs are up-to-date. He denies any pica and eats a variety of diet. He never donated blood. The patient was prescribed oral iron supplements and he takes Infusion plus but stopped about a week ago because it caused constipation. Family history: Brother died of lung cancer.  Paternal uncle has cancer.  MEDICAL HISTORY:  Past Medical History:  Diagnosis Date  . Acute ST elevation myocardial infarction (STEMI) of inferior wall (HCC) 11/03/2017   PCI/DESx3 to RCA after wire dissection. EF 45-50%  . BPH (benign prostatic hyperplasia)   . CAD (coronary artery disease)    Multivessel/left main disease  . Hypothyroidism   . Mitral regurgitation   . Mixed hyperlipidemia   . Type 2 diabetes mellitus ([place])     SURGICAL HISTORY: Past Surgical History:  Procedure Laterality Date  . CORONARY STENT INTERVENTION N/A 11/03/2017   Procedure: CORONARY STENT INTERVENTION;   Surgeon: Lorretta Harp, MD;  Location: [place] [facility];  Service: Cardiovascular;  Laterality: N/A;  . CORONARY/GRAFT ACUTE MI REVASCULARIZATION N/A 11/03/2017   Procedure: Coronary/Graft Acute MI Revascularization;  Surgeon: Lorretta Harp, MD;  Location: [place] [facility];  Service: Cardiovascular;  Laterality: N/A;  . LEFT HEART CATH AND CORONARY ANGIOGRAPHY N/A 11/03/2017   Procedure: LEFT HEART CATH AND CORONARY ANGIOGRAPHY;  Surgeon: Lorretta Harp, MD;  Location: [place] [facility];  Service: Cardiovascular;  Laterality: N/A;  . STOMACH SURGERY     Treatment of an abscess    SOCIAL HISTORY: Social History   Socioeconomic History  . Marital status: Married    Spouse name: Not on file  . Number of children: 1  . Years of education: Not on file  . Highest education level: Not on file  Occupational History  . Not on file  Social Needs  . Financial resource strain: Not hard at all  . Food insecurity:    Worry: Never true    Inability: Never true  . Transportation needs:    Medical: No    Non-medical: No  Tobacco Use  . Smoking status: Former Smoker    Packs/day: 1.00    Years: 40.00    Pack years: 40.00    Types: Cigarettes    Last attempt to quit: 06/26/1982    Years since quitting: 36.2  . Smokeless tobacco: Never Used  Substance and Sexual Activity  . Alcohol use: No  . Drug use:  No  . Sexual activity: Not on file  Lifestyle  . Physical activity:    Days per week: 0 days    Minutes per session: 0 min  . Stress: Not at all  Relationships  . Social connections:    Talks on phone: Once a week    Gets together: Once a week    Attends religious service: More than 4 times per year    Active member of club or organization: Yes    Attends meetings of clubs or organizations: More than 4 times per year    Relationship status: Married  . Intimate partner violence:    Fear of current or ex partner: No    Emotionally abused: No    Physically abused: No     Forced sexual activity: No  Other Topics Concern  . Not on file  Social History Narrative  . Not on file    FAMILY HISTORY: Family History  Problem Relation Age of Onset  . Heart failure Father 1  . Parkinsonism Mother 19  . Lung cancer Brother     ALLERGIES:  has No Known Allergies.  MEDICATIONS:  Current Outpatient Medications  Medication Sig Dispense Refill  . aspirin EC 81 MG EC tablet Take 1 tablet (81 mg total) by mouth daily.    Marland Kitchen atorvastatin (LIPITOR) 80 MG tablet Take 1 tablet (80 mg total) by mouth daily. 30 tablet 6  . carvedilol (COREG) 3.125 MG tablet Take 1 tablet (3.125 mg total) by mouth 2 (two) times daily with a meal. 60 tablet 6  . clopidogrel (PLAVIX) 75 MG tablet Take 1 tablet (75 mg total) by mouth daily. 90 tablet 3  . finasteride (PROSCAR) 5 MG tablet Take 5 mg by mouth daily.    Marland Kitchen levothyroxine (SYNTHROID, LEVOTHROID) 50 MCG tablet Take 50 mcg by mouth daily.     . nitroGLYCERIN (NITROSTAT) 0.4 MG SL tablet Place 1 tablet (0.4 mg total) under the tongue every 5 (five) minutes x 3 doses as needed for chest pain. 25 tablet 1  . pantoprazole (PROTONIX) 40 MG tablet Take 40 mg by mouth 2 (two) times daily.    . tamsulosin (FLOMAX) 0.4 MG CAPS capsule Take 0.4 mg by mouth daily.     . feeding supplement, ENSURE ENLIVE, (ENSURE ENLIVE) LIQD Take 237 mLs by mouth 2 (two) times daily between meals. (Patient not taking: Reported on 09/30/2018) 237 mL 12  . Iron-FA-B Cmp-C-Biot-Probiotic (FUSION PLUS) CAPS Take by mouth.     No current facility-administered medications for this visit.     REVIEW OF SYSTEMS:   Constitutional: Denies fevers, chills or abnormal night sweats Eyes: Denies blurriness of vision, double vision or watery eyes Ears, nose, mouth, throat, and face: Denies mucositis or sore throat Respiratory: Denies cough, dyspnea or wheezes Cardiovascular: Denies palpitation, chest discomfort or lower extremity swelling Gastrointestinal:  Denies nausea,  heartburn or change in bowel habits Skin: Denies abnormal skin rashes Lymphatics: Denies new lymphadenopathy or easy bruising Neurological:Denies numbness, tingling or new weaknesses Behavioral/Psych: Mood is stable, no new changes  All other systems were reviewed with the patient and are negative.  PHYSICAL EXAMINATION: ECOG PERFORMANCE STATUS: 1 - Symptomatic but completely ambulatory  Vitals:   09/30/18 1308  BP: (!) 146/64  Pulse: 76  Resp: 16  Temp: (!) 97.5 F (36.4 C)  SpO2: 99%   Filed Weights   09/30/18 1308  Weight: 157 lb 14.4 oz (71.6 kg)    GENERAL:alert, no distress and comfortable SKIN:  skin color, texture, turgor are normal, no rashes or significant lesions EYES: normal, conjunctiva are pink and non-injected, sclera clear OROPHARYNX:no exudate, no erythema and lips, buccal mucosa, and tongue normal  NECK: supple, thyroid normal size, non-tender, without nodularity LYMPH:  no palpable lymphadenopathy in the cervical, axillary or inguinal LUNGS: clear to auscultation and percussion with normal breathing effort HEART: regular rate & rhythm and no murmurs and no lower extremity edema ABDOMEN:abdomen soft, non-tender and normal bowel sounds Musculoskeletal:no cyanosis of digits and no clubbing  PSYCH: alert & oriented x 3 with fluent speech NEURO: no focal motor/sensory deficits  LABORATORY DATA:  I have reviewed the data as listed  RADIOGRAPHIC STUDIES: I have personally reviewed the radiological images as listed and agreed with the findings in the report.  ASSESSMENT & PLAN:  MGUS (monoclonal gammopathy of unknown significance) 1.  MGUS: - Work-up for CKD on 08/06/2018 showed 6.9% M spike on 24-hour urine. - Denies any new onset bone pains. -We will check SPEP, immunofixation and free light chains. -We will also do skeletal survey to rule out any lytic lesions.  2.  Normocytic anemia: - History of blood transfusion in June 2019 and February 2020 for  hemoglobin of 7.2 on 08/06/2018. - On 08/23/2018, his labs show hemoglobin of 9.2, MCV of 94, normal white count and platelet count. -He reports taking iron tablet which caused severe constipation.  He stopped taking it. - Colonoscopy was many years ago.  Has occasional blood on the tissue and he thinks it is from hemorrhoids. - Has a history of microscopic hematuria. -We will repeat CBC today.  We will evaluate for nutritional deficiencies including M63, folic acid, copper and iron levels.  We will also rule out hemolysis. -Likely etiology is CKD and relative iron deficiency. -We will consider parenteral iron therapy if iron panel is suggestive of deficiency.     All questions were answered. The patient knows to call the clinic with any problems, questions or concerns.      Derek Jack, MD 09/30/18 2:01 PM

## 2018-09-30 NOTE — Addendum Note (Signed)
Addended by: Farley Ly on: 09/30/2018 02:15 PM   Modules accepted: Orders

## 2018-09-30 NOTE — Assessment & Plan Note (Signed)
1.  MGUS: - Work-up for CKD on 08/06/2018 showed 6.9% M spike on 24-hour urine. - Denies any new onset bone pains. -We will check SPEP, immunofixation and free light chains. -We will also do skeletal survey to rule out any lytic lesions.  2.  Normocytic anemia: - History of blood transfusion in June 2019 and February 2020 for hemoglobin of 7.2 on 08/06/2018. - On 08/23/2018, his labs show hemoglobin of 9.2, MCV of 94, normal white count and platelet count. -He reports taking iron tablet which caused severe constipation.  He stopped taking it. - Colonoscopy was many years ago.  Has occasional blood on the tissue and he thinks it is from hemorrhoids. - Has a history of microscopic hematuria. -We will repeat CBC today.  We will evaluate for nutritional deficiencies including B91, folic acid, copper and iron levels.  We will also rule out hemolysis. -Likely etiology is CKD and relative iron deficiency. -We will consider parenteral iron therapy if iron panel is suggestive of deficiency.

## 2018-10-01 LAB — BETA 2 MICROGLOBULIN, SERUM: Beta-2 Microglobulin: 11.6 mg/L — ABNORMAL HIGH (ref 0.6–2.4)

## 2018-10-02 LAB — METHYLMALONIC ACID, SERUM: Methylmalonic Acid, Quantitative: 536 nmol/L — ABNORMAL HIGH (ref 0–378)

## 2018-10-02 LAB — COPPER, SERUM: Copper: 140 ug/dL (ref 72–166)

## 2018-10-08 ENCOUNTER — Other Ambulatory Visit: Payer: Self-pay

## 2018-10-08 ENCOUNTER — Inpatient Hospital Stay (HOSPITAL_COMMUNITY): Payer: Medicare Other

## 2018-10-08 DIAGNOSIS — D472 Monoclonal gammopathy: Secondary | ICD-10-CM

## 2018-10-08 DIAGNOSIS — D649 Anemia, unspecified: Secondary | ICD-10-CM

## 2018-10-08 LAB — CBC WITH DIFFERENTIAL/PLATELET
Abs Immature Granulocytes: 0.03 10*3/uL (ref 0.00–0.07)
Basophils Absolute: 0 10*3/uL (ref 0.0–0.1)
Basophils Relative: 0 %
Eosinophils Absolute: 0.2 10*3/uL (ref 0.0–0.5)
Eosinophils Relative: 4 %
HCT: 22.9 % — ABNORMAL LOW (ref 39.0–52.0)
Hemoglobin: 7 g/dL — ABNORMAL LOW (ref 13.0–17.0)
Immature Granulocytes: 1 %
Lymphocytes Relative: 20 %
Lymphs Abs: 1.2 10*3/uL (ref 0.7–4.0)
MCH: 31.5 pg (ref 26.0–34.0)
MCHC: 30.6 g/dL (ref 30.0–36.0)
MCV: 103.2 fL — ABNORMAL HIGH (ref 80.0–100.0)
Monocytes Absolute: 0.8 10*3/uL (ref 0.1–1.0)
Monocytes Relative: 13 %
Neutro Abs: 3.7 10*3/uL (ref 1.7–7.7)
Neutrophils Relative %: 62 %
Platelets: 177 10*3/uL (ref 150–400)
RBC: 2.22 MIL/uL — ABNORMAL LOW (ref 4.22–5.81)
RDW: 15.7 % — ABNORMAL HIGH (ref 11.5–15.5)
WBC: 5.9 10*3/uL (ref 4.0–10.5)
nRBC: 0 % (ref 0.0–0.2)

## 2018-10-08 LAB — PREPARE RBC (CROSSMATCH)

## 2018-10-08 LAB — ABO/RH: ABO/RH(D): O POS

## 2018-10-09 ENCOUNTER — Other Ambulatory Visit: Payer: Self-pay

## 2018-10-09 ENCOUNTER — Inpatient Hospital Stay (HOSPITAL_COMMUNITY): Payer: Medicare Other

## 2018-10-09 VITALS — BP 162/56 | HR 66 | Temp 97.6°F | Resp 18

## 2018-10-09 DIAGNOSIS — D472 Monoclonal gammopathy: Secondary | ICD-10-CM | POA: Diagnosis not present

## 2018-10-09 LAB — TYPE AND SCREEN
ABO/RH(D): O POS
Antibody Screen: NEGATIVE
Unit division: 0

## 2018-10-09 LAB — BPAM RBC
Blood Product Expiration Date: 202004252359
Unit Type and Rh: 5100

## 2018-10-09 LAB — PREPARE RBC (CROSSMATCH)

## 2018-10-09 MED ORDER — SODIUM CHLORIDE 0.9% IV SOLUTION
250.0000 mL | Freq: Once | INTRAVENOUS | Status: AC
  Administered 2018-10-09: 250 mL via INTRAVENOUS

## 2018-10-09 MED ORDER — SODIUM CHLORIDE 0.9% FLUSH: 10.0000 mL | INTRAVENOUS | Status: DC | PRN

## 2018-10-09 MED ORDER — DIPHENHYDRAMINE HCL 25 MG PO CAPS
25.0000 mg | ORAL_CAPSULE | Freq: Once | ORAL | Status: AC
  Administered 2018-10-09: 25 mg via ORAL
  Filled 2018-10-09: qty 1

## 2018-10-09 MED ORDER — ACETAMINOPHEN 325 MG PO TABS
650.0000 mg | ORAL_TABLET | Freq: Once | ORAL | Status: AC
  Administered 2018-10-09: 650 mg via ORAL
  Filled 2018-10-09: qty 2

## 2018-10-09 NOTE — Patient Instructions (Signed)
[facility] at [facility]  Discharge Instructions:   _______________________________________________________________  Thank you for choosing [facility] at [facility] to provide your oncology and hematology care.  To afford each patient quality time with our providers, please arrive at least 15 minutes before your scheduled appointment.  You need to re-schedule your appointment if you arrive 10 or more minutes late.  We strive to give you quality time with our providers, and arriving late affects you and other patients whose appointments are after yours.  Also, if you no show three or more times for appointments you may be dismissed from the clinic.  Again, thank you for choosing [facility] at [facility]. Our hope is that these requests will allow you access to exceptional care and in a timely manner. _______________________________________________________________  If you have questions after your visit, please contact our office at [phone] between the hours of 8:30 a.m. and 5:00 p.m. Voicemails left after 4:30 p.m. will not be returned until the following business day. _______________________________________________________________  For prescription refill requests, have your pharmacy contact our office. _______________________________________________________________  Recommendations made by the consultant and any test results will be sent to your referring physician. _______________________________________________________________ 

## 2018-10-09 NOTE — Progress Notes (Signed)
Pt received one unit of PRBC's today. Consent obtained for blood products. VSS. MAR reviewed.   1 unit of PRBC's given today. Tolerated infusion without adverse affects. Vital signs stable. No complaints at this time. Discharged from clinic via wheel chair and taken down to private vehicle by [employer] CNA. F/U with [facility] as scheduled.

## 2018-10-10 LAB — TYPE AND SCREEN
ABO/RH(D): O POS
Antibody Screen: NEGATIVE
Unit division: 0

## 2018-10-10 LAB — BPAM RBC
Blood Product Expiration Date: 202004252359
ISSUE DATE / TIME: 202004151237
Unit Type and Rh: 5100

## 2018-10-14 ENCOUNTER — Telehealth: Payer: Self-pay | Admitting: *Deleted

## 2018-10-14 ENCOUNTER — Encounter: Payer: Self-pay | Admitting: *Deleted

## 2018-10-14 NOTE — Telephone Encounter (Signed)
Medications reviewed.  Stated he went to [facility] this past Saturday (ED) & was given Lasix x 5 days.  Will request those notes.  Patient reminded to have weight and vitals ready before the phone call begins.    The patient verbally consented for a telehealth phone visit with [facility] and understands that his/her insurance company will be billed for the encounter.

## 2018-10-15 NOTE — Progress Notes (Signed)
Virtual Visit via Telephone Note   This visit type was conducted due to national recommendations for restrictions regarding the COVID-19 Pandemic (e.g. social distancing) in an effort to limit this patient's exposure and mitigate transmission in our community.  Due to his co-morbid illnesses, this patient is at least at moderate risk for complications without adequate follow up.  This format is felt to be most appropriate for this patient at this time.  The patient did not have access to video technology/had technical difficulties with video requiring transitioning to audio format only (telephone).  All issues noted in this document were discussed and addressed.  No physical exam could be performed with this format.  Please refer to the patient's chart for his  consent to telehealth for [facility]   Evaluation Performed:  Follow-up visit  Date:  10/16/2018   ID:  Randy Fitzpatrick., DOB [birth year], MRN [id number]  Patient Location: Home Provider Location: Office  PCP:  [name], Cleon Thoma Germany, MD  Cardiologist:  [name], MD  Chief Complaint: ER follow-up   History of Present Illness:    Randy Fitzpatrick. is a 85 y.o. male last seen in March.  He did not have video access and we spoke by phone today.  Records indicate recent ER visit at [facility] on April 18 with shortness of breath and leg swelling.  Lab work showed potassium 5.2, BUN 46, creatinine 2.93, AST 25, ALT 25, NT-proBNP 11491, hemoglobin 9.0, platelets 189, troponin T 0.032.  Chest x-ray reported no acute cardiopulmonary disease with 18 mm nodular opacity in the right upper lobe in the suprahilar region.  Abdominal films were negative.  ECG showed lead motion artifact with sinus rhythm, prolonged PR interval, leftward axis, IVCD.  He was instructed to take Lasix once for 5 days and follow-up.  At our last encounter I had asked him to use Lasix on an as-needed basis based on weight change, but he has not  been taking any since his weight has been stable.  He tells me that he feels better, less short of breath.  We have decided to keep him on Lasix 20 g every other day for now.  He will continue to check his weight daily.  He does not report any obvious angina symptoms and continues on aspirin and Plavix, PCI was in May of last year.  He does not report any obvious bleeding problems.  The patient does not have symptoms concerning for COVID-19 infection (fever, chills, cough, or new shortness of breath).  He stays around his house mostly, goes to the grocery store infrequently.   Past Medical History:  Diagnosis Date  . Acute ST elevation myocardial infarction (STEMI) of inferior wall (HCC) 11/03/2017   PCI/DESx3 to RCA after wire dissection. EF 45-50%  . BPH (benign prostatic hyperplasia)   . CAD (coronary artery disease)    Multivessel/left main disease  . Hypothyroidism   . Mitral regurgitation   . Mixed hyperlipidemia   . Type 2 diabetes mellitus ([place])    Past Surgical History:  Procedure Laterality Date  . CORONARY STENT INTERVENTION N/A 11/03/2017   Procedure: CORONARY STENT INTERVENTION;  Surgeon: Lorretta Harp, MD;  Location: [place] [facility];  Service: Cardiovascular;  Laterality: N/A;  . CORONARY/GRAFT ACUTE MI REVASCULARIZATION N/A 11/03/2017   Procedure: Coronary/Graft Acute MI Revascularization;  Surgeon: Lorretta Harp, MD;  Location: [place] [facility];  Service: Cardiovascular;  Laterality: N/A;  . LEFT HEART CATH  AND CORONARY ANGIOGRAPHY N/A 11/03/2017   Procedure: LEFT HEART CATH AND CORONARY ANGIOGRAPHY;  Surgeon: Lorretta Harp, MD;  Location: [place] [facility];  Service: Cardiovascular;  Laterality: N/A;  . STOMACH SURGERY     Treatment of an abscess     Current Meds  Medication Sig  . aspirin EC 81 MG EC tablet Take 1 tablet (81 mg total) by mouth daily.  Marland Kitchen atorvastatin (LIPITOR) 80 MG tablet Take 1 tablet (80 mg total) by mouth daily.  . carvedilol  (COREG) 3.125 MG tablet Take 1 tablet (3.125 mg total) by mouth 2 (two) times daily with a meal.  . clopidogrel (PLAVIX) 75 MG tablet Take 1 tablet (75 mg total) by mouth daily.  . feeding supplement, ENSURE ENLIVE, (ENSURE ENLIVE) LIQD Take 237 mLs by mouth 2 (two) times daily between meals.  . finasteride (PROSCAR) 5 MG tablet Take 5 mg by mouth daily.  . furosemide (LASIX) 20 MG tablet Take 1 tablet (20 mg total) by mouth every other day for 5 days. Per pt he stops taking this medication 10/17/18.  . Iron-FA-B Cmp-C-Biot-Probiotic (FUSION PLUS) CAPS Take by mouth.  . levothyroxine (SYNTHROID, LEVOTHROID) 50 MCG tablet Take 50 mcg by mouth daily.   . nitroGLYCERIN (NITROSTAT) 0.4 MG SL tablet Place 1 tablet (0.4 mg total) under the tongue every 5 (five) minutes x 3 doses as needed for chest pain.  . pantoprazole (PROTONIX) 40 MG tablet Take 40 mg by mouth 2 (two) times daily.  . tamsulosin (FLOMAX) 0.4 MG CAPS capsule Take 0.4 mg by mouth daily.   . [DISCONTINUED] furosemide (LASIX) 20 MG tablet Take 20 mg by mouth daily. Per pt he stops taking this medication 10/17/18.     Allergies:   Patient has no known allergies.   Social History   Tobacco Use  . Smoking status: Former Smoker    Packs/day: 1.00    Years: 40.00    Pack years: 40.00    Types: Cigarettes    Last attempt to quit: 06/26/1982    Years since quitting: 36.3  . Smokeless tobacco: Never Used  Substance Use Topics  . Alcohol use: No  . Drug use: No     Family Hx: The patient's family history includes Heart failure (age of onset: 68) in his father; Lung cancer in his brother; Parkinsonism (age of onset: 58) in his mother.  ROS:   Please see the history of present illness.    All other systems reviewed and are negative.   Prior CV studies:   The following studies were reviewed today:  Echocardiogram February 2020 ([place]): Mild LVH with LVEF 45 to 50%, inferoposterior wall motion abnormality,  grade 1 diastolic dysfunction, aortic valve sclerosis without stenosis, and mild mitral regurgitation.  Labs/Other Tests and Data Reviewed:    EKG:  An ECG dated 10/12/2018 was personally reviewed today and demonstrated:  Sinus rhythm with lead motion artifact, leftward axis, IVCD.  Recent Labs: 03/31/2018: B Natriuretic Peptide 650.5 09/30/2018: ALT 24; BUN 51; Creatinine, Ser 3.05; Potassium 5.1; Sodium 139 10/16/2018: Hemoglobin 8.3; Platelets 171   Recent Lipid Panel Lab Results  Component Value Date/Time   CHOL 224 (H) 11/04/2017 02:42 AM   TRIG 263 (H) 11/04/2017 02:42 AM   HDL 32 (L) 11/04/2017 02:42 AM   CHOLHDL 7.0 11/04/2017 02:42 AM   LDLCALC 139 (H) 11/04/2017 02:42 AM    Wt Readings from Last 3 Encounters:  10/16/18 156 lb (70.8 kg)  10/16/18 156  lb 14.4 oz (71.2 kg)  09/30/18 157 lb 14.4 oz (71.6 kg)     Objective:    Vital Signs:  BP (!) 123/58   Pulse 68   Ht 5\' 10"  (1.778 m)   Wt 156 lb (70.8 kg)   BMI 22.38 kg/m    Patient answered questions spontaneously on the phone.  Voice tone normal.  Speech pattern slow and deliberate as before.  He was not breathless while speaking in full sentences.  ASSESSMENT & PLAN:    1.  Ischemic cardiomyopathy with LVEF 45 to 50% by most recent evaluation.  Still prone to fluid overload, states that he feels better on Lasix.  We will plan to continue Lasix 20 mg every other day for now.  Keep tracking daily weight.  2.  CKD stage IV, creatinine 2.9 by recent check.  3.  Multivessel/left main CAD status post inferior STEMI in May 2019 treated with DES x3 to the RCA.  He continues on dual antiplatelet therapy and statin.  4.  History of recurrent anemia.  Recent follow-up hemoglobin up somewhat to 9.0.  COVID-19 Education: The signs and symptoms of COVID-19 were discussed with the patient and how to seek care for testing (follow up with PCP or arrange E-visit).  The importance of social distancing was discussed today.  Time:    Today, I have spent 9 minutes with the patient with telehealth technology discussing the above problems.     Medication Adjustments/Labs and Tests Ordered: Current medicines are reviewed at length with the patient today.  Concerns regarding medicines are outlined above.   Tests Ordered: No orders of the defined types were placed in this encounter.   Medication Changes: Meds ordered this encounter  Medications  . furosemide (LASIX) 20 MG tablet    Sig: Take 1 tablet (20 mg total) by mouth every other day for 5 days. Per pt he stops taking this medication 10/17/18.    Dispense:  15 tablet    Refill:  1    10/16/2018 change in directions    Disposition:  Follow up telehealth visit in 4 to 6 weeks.  Signed, [name], MD  10/16/2018 2:14 PM    [facility]

## 2018-10-16 ENCOUNTER — Other Ambulatory Visit: Payer: Self-pay

## 2018-10-16 ENCOUNTER — Inpatient Hospital Stay (HOSPITAL_BASED_OUTPATIENT_CLINIC_OR_DEPARTMENT_OTHER): Payer: Medicare Other | Admitting: Hematology

## 2018-10-16 ENCOUNTER — Encounter: Payer: Self-pay | Admitting: Cardiology

## 2018-10-16 ENCOUNTER — Encounter (HOSPITAL_COMMUNITY): Payer: Self-pay | Admitting: Hematology

## 2018-10-16 ENCOUNTER — Inpatient Hospital Stay (HOSPITAL_COMMUNITY): Payer: Medicare Other

## 2018-10-16 ENCOUNTER — Telehealth (INDEPENDENT_AMBULATORY_CARE_PROVIDER_SITE_OTHER): Payer: Medicare Other | Admitting: Cardiology

## 2018-10-16 VITALS — BP 123/58 | HR 68 | Ht 70.0 in | Wt 156.0 lb

## 2018-10-16 VITALS — BP 123/58 | HR 64 | Temp 97.5°F | Resp 18 | Wt 156.9 lb

## 2018-10-16 DIAGNOSIS — D649 Anemia, unspecified: Secondary | ICD-10-CM

## 2018-10-16 DIAGNOSIS — I5022 Chronic systolic (congestive) heart failure: Secondary | ICD-10-CM | POA: Diagnosis not present

## 2018-10-16 DIAGNOSIS — N189 Chronic kidney disease, unspecified: Secondary | ICD-10-CM

## 2018-10-16 DIAGNOSIS — N185 Chronic kidney disease, stage 5: Secondary | ICD-10-CM | POA: Insufficient documentation

## 2018-10-16 DIAGNOSIS — N184 Chronic kidney disease, stage 4 (severe): Secondary | ICD-10-CM | POA: Diagnosis not present

## 2018-10-16 DIAGNOSIS — E039 Hypothyroidism, unspecified: Secondary | ICD-10-CM

## 2018-10-16 DIAGNOSIS — I10 Essential (primary) hypertension: Secondary | ICD-10-CM

## 2018-10-16 DIAGNOSIS — D472 Monoclonal gammopathy: Secondary | ICD-10-CM | POA: Diagnosis not present

## 2018-10-16 DIAGNOSIS — I25110 Atherosclerotic heart disease of native coronary artery with unstable angina pectoris: Secondary | ICD-10-CM

## 2018-10-16 DIAGNOSIS — Z7189 Other specified counseling: Secondary | ICD-10-CM

## 2018-10-16 DIAGNOSIS — Z87891 Personal history of nicotine dependence: Secondary | ICD-10-CM

## 2018-10-16 LAB — CBC
HCT: 25.9 % — ABNORMAL LOW (ref 39.0–52.0)
Hemoglobin: 8.3 g/dL — ABNORMAL LOW (ref 13.0–17.0)
MCH: 32.3 pg (ref 26.0–34.0)
MCHC: 32 g/dL (ref 30.0–36.0)
MCV: 100.8 fL — ABNORMAL HIGH (ref 80.0–100.0)
Platelets: 171 10*3/uL (ref 150–400)
RBC: 2.57 MIL/uL — ABNORMAL LOW (ref 4.22–5.81)
RDW: 15.9 % — ABNORMAL HIGH (ref 11.5–15.5)
WBC: 5.7 10*3/uL (ref 4.0–10.5)
nRBC: 0 % (ref 0.0–0.2)

## 2018-10-16 MED ORDER — FUROSEMIDE 20 MG PO TABS: 20.0000 mg | ORAL_TABLET | ORAL | 1 refills | Status: DC

## 2018-10-16 MED ORDER — EPOETIN ALFA 20000 UNIT/ML IJ SOLN
INTRAMUSCULAR | Status: AC
  Filled 2018-10-16: qty 1

## 2018-10-16 MED ORDER — EPOETIN ALFA 20000 UNIT/ML IJ SOLN
20000.0000 [IU] | Freq: Once | INTRAMUSCULAR | Status: AC
  Administered 2018-10-16: 12:00:00 20000 [IU] via SUBCUTANEOUS

## 2018-10-16 NOTE — Patient Instructions (Addendum)
[place] at [facility] Discharge Instructions  You were seen today by Dr. Delton Coombes. He went over your recent lab results. He would like to start you on Procrit injections that will help your blood counts. He will see you back in 3 weeks for labs and follow up.   Thank you for choosing [place] at [facility] to provide your oncology and hematology care.  To afford each patient quality time with our provider, please arrive at least 15 minutes before your scheduled appointment time.   If you have a lab appointment with the [facility] please come in thru the  Main Entrance and check in at the main information desk  You need to re-schedule your appointment should you arrive 10 or more minutes late.  We strive to give you quality time with our providers, and arriving late affects you and other patients whose appointments are after yours.  Also, if you no show three or more times for appointments you may be dismissed from the clinic at the providers discretion.     Again, thank you for choosing [facility].  Our hope is that these requests will decrease the amount of time that you wait before being seen by our physicians.       _____________________________________________________________  Should you have questions after your visit to [facility], please contact our office at [phone] between the hours of 8:00 a.m. and 4:30 p.m.  Voicemails left after 4:00 p.m. will not be returned until the following business day.  For prescription refill requests, have your pharmacy contact our office and allow 72 hours.    Cancer Center Support Programs:   > Cancer Support Group  2nd Tuesday of the month 1pm-2pm, Journey Room

## 2018-10-16 NOTE — Progress Notes (Signed)
Procrit given per orders. Patient tolerated it well without problems. Vitals stable and discharged home from clinic ambulatory. Follow up as scheduled.   Procrit educational material given to patient since this is his first injection.

## 2018-10-16 NOTE — Assessment & Plan Note (Addendum)
1.  MGUS: - Work-up for CKD on 08/06/2018 showed 6.9% M spike on 24-hour urine. - Denies any new onset bone pains. -We reviewed results of skeletal survey from 10/12/2018.  There were no lytic lesions. -We will send blood for SPEP, immunofixation and free light chains today. -I will see him back in 4 weeks for follow-up to discuss results.  2.  Normocytic anemia: - History of blood transfusion in June 2019 and February 2020 for hemoglobin of 7.2 on 08/06/2018. - On 08/23/2018, his labs show hemoglobin of 9.2, MCV of 94, normal white count and platelet count. -He took iron tablet in the past which caused severe constipation. -Colonoscopy many years ago.  Occasional blood on the tissue which he thinks from hemorrhoids. - His S85, folic acid, ferritin and iron panel were normal. -He had to receive 2 units of PRBC on 10/11/2018 for hemoglobin of 7. -He was reportedly seen in the ER on 10/12/2018 for difficulty breathing and was started back on Lasix. - I talked to him about starting him on Procrit.  We discussed the side effects in detail.  He will start Procrit 20,000 units once every 2 weeks.  We will titrated as needed. -I will see him back in 4 weeks with repeat blood counts.

## 2018-10-16 NOTE — Progress Notes (Signed)
Randy Fitzpatrick, [place] [address]   CLINIC:  Medical Oncology/Hematology  PCP:  [place] [id number] [phone]   REASON FOR VISIT:  Follow-up for MGUS     INTERVAL HISTORY:  Randy Fitzpatrick 85 y.o. male returns for routine follow-up. He is here today alone. He states that he has noticed some itching on both hands. Denies any nausea, vomiting, or diarrhea. Denies any new pains. Had not noticed any recent bleeding such as epistaxis, hematuria or hematochezia. Denies recent chest pain on exertion, shortness of breath on minimal exertion, pre-syncopal episodes, or palpitations. Denies any numbness or tingling in hands or feet. Denies any recent fevers, infections, or recent hospitalizations. Patient reports appetite at 100% and energy level at 25%.    REVIEW OF SYSTEMS:  Review of Systems  Respiratory: Positive for shortness of breath.   Skin: Positive for itching.     PAST MEDICAL/SURGICAL HISTORY:  Past Medical History:  Diagnosis Date  . Acute ST elevation myocardial infarction (STEMI) of inferior wall (HCC) 11/03/2017   PCI/DESx3 to RCA after wire dissection. EF 45-50%  . BPH (benign prostatic hyperplasia)   . CAD (coronary artery disease)    Multivessel/left main disease  . Hypothyroidism   . Mitral regurgitation   . Mixed hyperlipidemia   . Type 2 diabetes mellitus ([place])    Past Surgical History:  Procedure Laterality Date  . CORONARY STENT INTERVENTION N/A 11/03/2017   Procedure: CORONARY STENT INTERVENTION;  Surgeon: Lorretta Harp, MD;  Location: [facility];  Service: Cardiovascular;  Laterality: N/A;  . CORONARY/GRAFT ACUTE MI REVASCULARIZATION N/A 11/03/2017   Procedure: Coronary/Graft Acute MI Revascularization;  Surgeon: Lorretta Harp, MD;  Location: [place];  Service: Cardiovascular;  Laterality: N/A;  . LEFT HEART CATH AND CORONARY ANGIOGRAPHY N/A 11/03/2017   Procedure: LEFT HEART CATH AND CORONARY ANGIOGRAPHY;  Surgeon: Lorretta Harp, MD;  Location: [facility];  Service: Cardiovascular;  Laterality: N/A;  . STOMACH SURGERY     Treatment of an abscess     SOCIAL HISTORY:  Social History   Socioeconomic History  . Marital status: Married    Spouse name: Not on file  . Number of children: 1  . Years of education: Not on file  . Highest education level: Not on file  Occupational History  . Not on file  Social Needs  . Financial resource strain: Not hard at all  . Food insecurity:    Worry: Never true    Inability: Never true  . Transportation needs:    Medical: No    Non-medical: No  Tobacco Use  . Smoking status: Former Smoker    Packs/day: 1.00    Years: 40.00    Pack years: 40.00    Types: Cigarettes    Last attempt to quit: 06/26/1982    Years since quitting: 36.3  . Smokeless tobacco: Never Used  Substance and Sexual Activity  . Alcohol use: No  . Drug use: No  . Sexual activity: Not on file  Lifestyle  . Physical activity:    Days per week: 0 days    Minutes per session: 0 min  . Stress: Not at all  Relationships  . Social connections:    Talks on phone: Once a week    Gets together: Once a week    Attends religious service: More than 4 times per year    Active  member of club or organization: Yes    Attends meetings of clubs or organizations: More than 4 times per year    Relationship status: Married  . Intimate partner violence:    Fear of current or ex partner: No    Emotionally abused: No    Physically abused: No    Forced sexual activity: No  Other Topics Concern  . Not on file  Social History Narrative  . Not on file    FAMILY HISTORY:  Family History  Problem Relation Age of Onset  . Heart failure Father 72  . Parkinsonism Mother 54  . Lung cancer Brother     CURRENT MEDICATIONS:  Outpatient Encounter Medications as of 10/16/2018  Medication Sig  . aspirin EC 81 MG EC tablet Take 1  tablet (81 mg total) by mouth daily.  Marland Kitchen atorvastatin (LIPITOR) 80 MG tablet Take 1 tablet (80 mg total) by mouth daily.  . carvedilol (COREG) 3.125 MG tablet Take 1 tablet (3.125 mg total) by mouth 2 (two) times daily with a meal.  . clopidogrel (PLAVIX) 75 MG tablet Take 1 tablet (75 mg total) by mouth daily.  . feeding supplement, ENSURE ENLIVE, (ENSURE ENLIVE) LIQD Take 237 mLs by mouth 2 (two) times daily between meals.  . finasteride (PROSCAR) 5 MG tablet Take 5 mg by mouth daily.  . furosemide (LASIX) 20 MG tablet Take 20 mg by mouth daily. Per pt he stops taking this medication 10/17/18.  . Iron-FA-B Cmp-C-Biot-Probiotic (FUSION PLUS) CAPS Take by mouth.  . levothyroxine (SYNTHROID, LEVOTHROID) 50 MCG tablet Take 50 mcg by mouth daily.   . nitroGLYCERIN (NITROSTAT) 0.4 MG SL tablet Place 1 tablet (0.4 mg total) under the tongue every 5 (five) minutes x 3 doses as needed for chest pain.  . pantoprazole (PROTONIX) 40 MG tablet Take 40 mg by mouth 2 (two) times daily.  . tamsulosin (FLOMAX) 0.4 MG CAPS capsule Take 0.4 mg by mouth daily.    No facility-administered encounter medications on file as of 10/16/2018.     ALLERGIES:  No Known Allergies   PHYSICAL EXAM:  ECOG Performance status: 2  Vitals:   10/16/18 1103  BP: (!) 123/58  Pulse: 64  Resp: 18  Temp: (!) 97.5 F (36.4 C)  SpO2: 100%   Filed Weights   10/16/18 1103  Weight: 156 lb 14.4 oz (71.2 kg)    Physical Exam Vitals signs reviewed.  Constitutional:      Appearance: Normal appearance.  Cardiovascular:     Rate and Rhythm: Normal rate and regular rhythm.     Heart sounds: Normal heart sounds.  Pulmonary:     Effort: Pulmonary effort is normal.     Breath sounds: Normal breath sounds.  Abdominal:     General: There is no distension.     Palpations: Abdomen is soft. There is no mass.  Musculoskeletal:     Right lower leg: Edema present.     Left lower leg: Edema present.  Skin:    General: Skin is  warm.  Neurological:     General: No focal deficit present.     Mental Status: He is alert and oriented to person, place, and time.  Psychiatric:        Mood and Affect: Mood normal.        Behavior: Behavior normal.      LABORATORY DATA:  I have reviewed the labs as listed.  CBC    Component Value Date/Time   WBC 5.9 10/08/2018  0956   RBC 2.22 (L) 10/08/2018 0956   HGB 7.0 (L) 10/08/2018 0956   HCT 22.9 (L) 10/08/2018 0956   HCT 25.3 (L) 04/01/2018 1446   PLT 177 10/08/2018 0956   MCV 103.2 (H) 10/08/2018 0956   MCH 31.5 10/08/2018 0956   MCHC 30.6 10/08/2018 0956   RDW 15.7 (H) 10/08/2018 0956   LYMPHSABS 1.2 10/08/2018 0956   MONOABS 0.8 10/08/2018 0956   EOSABS 0.2 10/08/2018 0956   BASOSABS 0.0 10/08/2018 0956   CMP Latest Ref Rng & Units 09/30/2018 04/05/2018 04/04/2018  Glucose 70 - 99 mg/dL 102(H) 115(H) 123(H)  BUN 8 - 23 mg/dL 51(H) 64(H) 63(H)  Creatinine 0.61 - 1.24 mg/dL 3.05(H) 2.28(H) 2.36(H)  Sodium 135 - 145 mmol/L 139 140 139  Potassium 3.5 - 5.1 mmol/L 5.1 4.5 4.5  Chloride 98 - 111 mmol/L 111 105 108  CO2 22 - 32 mmol/L 20(L) 24 24  Calcium 8.9 - 10.3 mg/dL 8.6(L) 8.7(L) 8.4(L)  Total Protein 6.5 - 8.1 g/dL 7.3 - -  Total Bilirubin 0.3 - 1.2 mg/dL 0.4 - -  Alkaline Phos 38 - 126 U/L 68 - -  AST 15 - 41 U/L 21 - -  ALT 0 - 44 U/L 24 - -       DIAGNOSTIC IMAGING:  I have independently reviewed the scans and discussed with the patient.   I have reviewed Venita Lick LPN's note and agree with the documentation.  I personally performed a face-to-face visit, made revisions and my assessment and plan is as follows.    ASSESSMENT & PLAN:   Normocytic anemia 1.  MGUS: - Work-up for CKD on 08/06/2018 showed 6.9% M spike on 24-hour urine. - Denies any new onset bone pains. -We reviewed results of skeletal survey from 10/12/2018.  There were no lytic lesions. -We will send blood for SPEP, immunofixation and free light chains today. -I will see  him back in 4 weeks for follow-up to discuss results.  2.  Normocytic anemia: - History of blood transfusion in June 2019 and February 2020 for hemoglobin of 7.2 on 08/06/2018. - On 08/23/2018, his labs show hemoglobin of 9.2, MCV of 94, normal white count and platelet count. -He took iron tablet in the past which caused severe constipation. -Colonoscopy many years ago.  Occasional blood on the tissue which he thinks from hemorrhoids. - His M42, folic acid, ferritin and iron panel were normal. -He had to receive 2 units of PRBC on 10/11/2018 for hemoglobin of 7. -He was reportedly seen in the ER on 10/12/2018 for difficulty breathing and was started back on Lasix. - I talked to him about starting him on Procrit.  We discussed the side effects in detail.  He will start Procrit 20,000 units once every 2 weeks.  We will titrated as needed. -I will see him back in 4 weeks with repeat blood counts.  Total time spent is 25 minutes with more than 50% of the time spent face-to-face discussing the start of Procrit, side effects and coordination of care.    Orders placed this encounter:  Orders Placed This Encounter  Procedures  . Protein electrophoresis, serum  . Immunofixation electrophoresis  . Kappa/lambda light chains  . [place], [place] [phone]

## 2018-10-16 NOTE — Patient Instructions (Addendum)
Medication Instructions:   Your physician has recommended you make the following change in your medication:   Take furosemide 20 mg by mouth every other day  Continue all other medications the same  Labwork:  NONE  Testing/Procedures:  NONE  Follow-Up:  Your physician recommends that you schedule a follow-up appointment in: 4-6 weeks with Dr. Domenic Polite for a telehealth visit.  Any Other Special Instructions Will Be Listed Below (If Applicable).  If you need a refill on your cardiac medications before your next appointment, please call your pharmacy.

## 2018-10-17 LAB — IMMUNOFIXATION ELECTROPHORESIS
IgA: 71 mg/dL (ref 61–437)
IgG (Immunoglobin G), Serum: 1132 mg/dL (ref 603–1613)
IgM (Immunoglobulin M), Srm: 33 mg/dL (ref 15–143)
Total Protein ELP: 6.4 g/dL (ref 6.0–8.5)

## 2018-10-17 LAB — PROTEIN ELECTROPHORESIS, SERUM
A/G Ratio: 1.1 (ref 0.7–1.7)
Albumin ELP: 3.4 g/dL (ref 2.9–4.4)
Alpha-1-Globulin: 0.3 g/dL (ref 0.0–0.4)
Alpha-2-Globulin: 1 g/dL (ref 0.4–1.0)
Beta Globulin: 0.7 g/dL (ref 0.7–1.3)
Gamma Globulin: 1 g/dL (ref 0.4–1.8)
Globulin, Total: 3.1 g/dL (ref 2.2–3.9)
M-Spike, %: 0.8 g/dL — ABNORMAL HIGH
Total Protein ELP: 6.5 g/dL (ref 6.0–8.5)

## 2018-10-17 LAB — KAPPA/LAMBDA LIGHT CHAINS
Kappa free light chain: 1451.8 mg/L — ABNORMAL HIGH (ref 3.3–19.4)
Kappa, lambda light chain ratio: 63.96 — ABNORMAL HIGH (ref 0.26–1.65)
Lambda free light chains: 22.7 mg/L (ref 5.7–26.3)

## 2018-10-30 ENCOUNTER — Other Ambulatory Visit: Payer: Self-pay

## 2018-10-30 ENCOUNTER — Other Ambulatory Visit: Payer: Self-pay | Admitting: *Deleted

## 2018-10-30 ENCOUNTER — Inpatient Hospital Stay (HOSPITAL_COMMUNITY): Payer: Medicare Other

## 2018-10-30 ENCOUNTER — Inpatient Hospital Stay (HOSPITAL_COMMUNITY): Payer: Medicare Other | Attending: Hematology

## 2018-10-30 ENCOUNTER — Encounter (HOSPITAL_COMMUNITY): Payer: Self-pay

## 2018-10-30 VITALS — BP 129/56 | HR 68 | Temp 97.5°F | Resp 16

## 2018-10-30 DIAGNOSIS — D649 Anemia, unspecified: Secondary | ICD-10-CM | POA: Diagnosis not present

## 2018-10-30 DIAGNOSIS — R0602 Shortness of breath: Secondary | ICD-10-CM | POA: Diagnosis not present

## 2018-10-30 DIAGNOSIS — N189 Chronic kidney disease, unspecified: Secondary | ICD-10-CM

## 2018-10-30 DIAGNOSIS — E039 Hypothyroidism, unspecified: Secondary | ICD-10-CM | POA: Diagnosis not present

## 2018-10-30 DIAGNOSIS — I2510 Atherosclerotic heart disease of native coronary artery without angina pectoris: Secondary | ICD-10-CM | POA: Diagnosis not present

## 2018-10-30 DIAGNOSIS — N183 Chronic kidney disease, stage 3 (moderate): Secondary | ICD-10-CM | POA: Diagnosis not present

## 2018-10-30 DIAGNOSIS — D472 Monoclonal gammopathy: Secondary | ICD-10-CM | POA: Diagnosis present

## 2018-10-30 DIAGNOSIS — E1122 Type 2 diabetes mellitus with diabetic chronic kidney disease: Secondary | ICD-10-CM | POA: Diagnosis not present

## 2018-10-30 DIAGNOSIS — Z87891 Personal history of nicotine dependence: Secondary | ICD-10-CM | POA: Diagnosis not present

## 2018-10-30 DIAGNOSIS — D509 Iron deficiency anemia, unspecified: Secondary | ICD-10-CM | POA: Diagnosis not present

## 2018-10-30 LAB — CBC WITH DIFFERENTIAL/PLATELET
Abs Immature Granulocytes: 0.02 10*3/uL (ref 0.00–0.07)
Basophils Absolute: 0 10*3/uL (ref 0.0–0.1)
Basophils Relative: 0 %
Eosinophils Absolute: 0.3 10*3/uL (ref 0.0–0.5)
Eosinophils Relative: 5 %
HCT: 25.2 % — ABNORMAL LOW (ref 39.0–52.0)
Hemoglobin: 7.6 g/dL — ABNORMAL LOW (ref 13.0–17.0)
Immature Granulocytes: 0 %
Lymphocytes Relative: 21 %
Lymphs Abs: 1.1 10*3/uL (ref 0.7–4.0)
MCH: 32.1 pg (ref 26.0–34.0)
MCHC: 30.2 g/dL (ref 30.0–36.0)
MCV: 106.3 fL — ABNORMAL HIGH (ref 80.0–100.0)
Monocytes Absolute: 0.6 10*3/uL (ref 0.1–1.0)
Monocytes Relative: 12 %
Neutro Abs: 3.1 10*3/uL (ref 1.7–7.7)
Neutrophils Relative %: 62 %
Platelets: 193 10*3/uL (ref 150–400)
RBC: 2.37 MIL/uL — ABNORMAL LOW (ref 4.22–5.81)
RDW: 16.1 % — ABNORMAL HIGH (ref 11.5–15.5)
WBC: 5.1 10*3/uL (ref 4.0–10.5)
nRBC: 0 % (ref 0.0–0.2)

## 2018-10-30 MED ORDER — EPOETIN ALFA 20000 UNIT/ML IJ SOLN
INTRAMUSCULAR | Status: AC
  Filled 2018-10-30: qty 1

## 2018-10-30 MED ORDER — EPOETIN ALFA 20000 UNIT/ML IJ SOLN
20000.0000 [IU] | Freq: Once | INTRAMUSCULAR | Status: AC
  Administered 2018-10-30: 20000 [IU] via SUBCUTANEOUS

## 2018-10-30 MED ORDER — CLOPIDOGREL BISULFATE 75 MG PO TABS: 75.0000 mg | ORAL_TABLET | Freq: Every day | ORAL | 0 refills | Status: DC

## 2018-10-30 NOTE — Progress Notes (Signed)
Randy Fitzpatrick. presents today for injection per MD orders. Procrit 20,000  administered SQ in left Upper Arm. Administration without incident. Patient tolerated well. Vitals stable and discharged home from clinic ambulatory. Follow up as scheduled.

## 2018-10-30 NOTE — Patient Instructions (Signed)
[facility] at [facility]  Discharge Instructions:   _______________________________________________________________  Thank you for choosing [facility] at [facility] to provide your oncology and hematology care.  To afford each patient quality time with our providers, please arrive at least 15 minutes before your scheduled appointment.  You need to re-schedule your appointment if you arrive 10 or more minutes late.  We strive to give you quality time with our providers, and arriving late affects you and other patients whose appointments are after yours.  Also, if you no show three or more times for appointments you may be dismissed from the clinic.  Again, thank you for choosing [facility]. Our hope is that these requests will allow you access to exceptional care and in a timely manner. _______________________________________________________________  If you have questions after your visit, please contact our office at [phone] between the hours of 8:30 a.m. and 5:00 p.m. Voicemails left after 4:30 p.m. will not be returned until the following business day. _______________________________________________________________  For prescription refill requests, have your pharmacy contact our office. _______________________________________________________________  Recommendations made by the consultant and any test results will be sent to your referring physician. _______________________________________________________________ 

## 2018-11-05 ENCOUNTER — Inpatient Hospital Stay (HOSPITAL_COMMUNITY): Payer: Medicare Other

## 2018-11-05 ENCOUNTER — Other Ambulatory Visit: Payer: Self-pay

## 2018-11-05 DIAGNOSIS — D472 Monoclonal gammopathy: Secondary | ICD-10-CM | POA: Diagnosis not present

## 2018-11-05 DIAGNOSIS — N183 Chronic kidney disease, stage 3 (moderate): Secondary | ICD-10-CM | POA: Diagnosis not present

## 2018-11-05 DIAGNOSIS — N189 Chronic kidney disease, unspecified: Secondary | ICD-10-CM

## 2018-11-05 LAB — CBC WITH DIFFERENTIAL/PLATELET
Abs Immature Granulocytes: 0.02 10*3/uL (ref 0.00–0.07)
Basophils Absolute: 0 10*3/uL (ref 0.0–0.1)
Basophils Relative: 0 %
Eosinophils Absolute: 0.2 10*3/uL (ref 0.0–0.5)
Eosinophils Relative: 4 %
HCT: 25.4 % — ABNORMAL LOW (ref 39.0–52.0)
Hemoglobin: 7.7 g/dL — ABNORMAL LOW (ref 13.0–17.0)
Immature Granulocytes: 0 %
Lymphocytes Relative: 19 %
Lymphs Abs: 1 10*3/uL (ref 0.7–4.0)
MCH: 32.8 pg (ref 26.0–34.0)
MCHC: 30.3 g/dL (ref 30.0–36.0)
MCV: 108.1 fL — ABNORMAL HIGH (ref 80.0–100.0)
Monocytes Absolute: 0.6 10*3/uL (ref 0.1–1.0)
Monocytes Relative: 11 %
Neutro Abs: 3.6 10*3/uL (ref 1.7–7.7)
Neutrophils Relative %: 66 %
Platelets: 217 10*3/uL (ref 150–400)
RBC: 2.35 MIL/uL — ABNORMAL LOW (ref 4.22–5.81)
RDW: 16.1 % — ABNORMAL HIGH (ref 11.5–15.5)
WBC: 5.6 10*3/uL (ref 4.0–10.5)
nRBC: 0 % (ref 0.0–0.2)

## 2018-11-14 ENCOUNTER — Encounter (HOSPITAL_COMMUNITY): Payer: Self-pay | Admitting: Hematology

## 2018-11-14 ENCOUNTER — Inpatient Hospital Stay (HOSPITAL_COMMUNITY): Payer: Medicare Other

## 2018-11-14 ENCOUNTER — Other Ambulatory Visit: Payer: Self-pay

## 2018-11-14 ENCOUNTER — Inpatient Hospital Stay (HOSPITAL_BASED_OUTPATIENT_CLINIC_OR_DEPARTMENT_OTHER): Payer: Medicare Other | Admitting: Hematology

## 2018-11-14 DIAGNOSIS — Z87891 Personal history of nicotine dependence: Secondary | ICD-10-CM

## 2018-11-14 DIAGNOSIS — D472 Monoclonal gammopathy: Secondary | ICD-10-CM | POA: Diagnosis not present

## 2018-11-14 DIAGNOSIS — N189 Chronic kidney disease, unspecified: Secondary | ICD-10-CM

## 2018-11-14 DIAGNOSIS — E1122 Type 2 diabetes mellitus with diabetic chronic kidney disease: Secondary | ICD-10-CM

## 2018-11-14 DIAGNOSIS — R0602 Shortness of breath: Secondary | ICD-10-CM

## 2018-11-14 DIAGNOSIS — D649 Anemia, unspecified: Secondary | ICD-10-CM

## 2018-11-14 DIAGNOSIS — D509 Iron deficiency anemia, unspecified: Secondary | ICD-10-CM

## 2018-11-14 DIAGNOSIS — I2510 Atherosclerotic heart disease of native coronary artery without angina pectoris: Secondary | ICD-10-CM

## 2018-11-14 DIAGNOSIS — N183 Chronic kidney disease, stage 3 (moderate): Secondary | ICD-10-CM

## 2018-11-14 DIAGNOSIS — E039 Hypothyroidism, unspecified: Secondary | ICD-10-CM

## 2018-11-14 LAB — CBC WITH DIFFERENTIAL/PLATELET
Abs Immature Granulocytes: 0.02 10*3/uL (ref 0.00–0.07)
Basophils Absolute: 0 10*3/uL (ref 0.0–0.1)
Basophils Relative: 0 %
Eosinophils Absolute: 0.2 10*3/uL (ref 0.0–0.5)
Eosinophils Relative: 4 %
HCT: 25.3 % — ABNORMAL LOW (ref 39.0–52.0)
Hemoglobin: 7.8 g/dL — ABNORMAL LOW (ref 13.0–17.0)
Immature Granulocytes: 0 %
Lymphocytes Relative: 18 %
Lymphs Abs: 1 10*3/uL (ref 0.7–4.0)
MCH: 32.4 pg (ref 26.0–34.0)
MCHC: 30.8 g/dL (ref 30.0–36.0)
MCV: 105 fL — ABNORMAL HIGH (ref 80.0–100.0)
Monocytes Absolute: 0.8 10*3/uL (ref 0.1–1.0)
Monocytes Relative: 14 %
Neutro Abs: 3.4 10*3/uL (ref 1.7–7.7)
Neutrophils Relative %: 64 %
Platelets: 206 10*3/uL (ref 150–400)
RBC: 2.41 MIL/uL — ABNORMAL LOW (ref 4.22–5.81)
RDW: 15.5 % (ref 11.5–15.5)
WBC: 5.4 10*3/uL (ref 4.0–10.5)
nRBC: 0 % (ref 0.0–0.2)

## 2018-11-14 MED ORDER — EPOETIN ALFA 40000 UNIT/ML IJ SOLN
INTRAMUSCULAR | Status: AC
  Filled 2018-11-14: qty 1

## 2018-11-14 MED ORDER — EPOETIN ALFA 20000 UNIT/ML IJ SOLN
INTRAMUSCULAR | Status: AC
  Filled 2018-11-14: qty 1

## 2018-11-14 MED ORDER — EPOETIN ALFA 40000 UNIT/ML IJ SOLN
30000.0000 [IU] | Freq: Once | INTRAMUSCULAR | Status: AC
  Administered 2018-11-14: 30000 [IU] via SUBCUTANEOUS

## 2018-11-14 MED ORDER — EPOETIN ALFA 10000 UNIT/ML IJ SOLN
INTRAMUSCULAR | Status: AC
  Filled 2018-11-14: qty 1

## 2018-11-14 NOTE — Assessment & Plan Note (Addendum)
1.  MGUS: - Work-up for CKD on 08/06/2018 showed 6.9% M spike on 24-hour urine. - Denies any new onset bone pains. -We reviewed results of skeletal survey from 10/12/2018.  There were no lytic lesions. -Multiple Myeloma Labs significant for M-spike: 0.8, Kappa: 1451.8, Lambda: 22.7, ratio: 63.96, IFE: IgG kappa  -Discussed findings with patient. Recommend we proceed with Bone Marrow Biopsy to determine percent of plasma cells in the marrow. Pt agrees to proceed with procedure. He will RTC in 2 weeks.   2.  Normocytic anemia: - History of blood transfusion in June 2019 and February 2020 for hemoglobin of 7.2 on 08/06/2018. - On 08/23/2018, his labs show hemoglobin of 9.2, MCV of 94, normal white count and platelet count. -He took iron tablet in the past which caused severe constipation. -Colonoscopy many years ago.  Occasional blood on the tissue which he thinks from hemorrhoids. - His Y57, folic acid, ferritin and iron panel were normal. -He had to receive 2 units of PRBC on 10/11/2018 for hemoglobin of 7. -He was reportedly seen in the ER on 10/12/2018 for difficulty breathing and was started back on Lasix. - Procrit 20,000 units every 2 weeks was started on 10/16/2018. - Hgb today is 7.8. He reports fatigue and DOE. -He was receiving Procrit 20,000 units every 2 weeks.  I will increase it to 30,000 units weekly.

## 2018-11-14 NOTE — Progress Notes (Signed)
Randy Fitzpatrick. presents today for injection per MD orders. Procrit 30,000 administered SQ in right Upper Arm. Administration without incident. Patient tolerated well.

## 2018-11-14 NOTE — Progress Notes (Signed)
[place], [place] [address]   CLINIC:  Medical Oncology/Hematology  PCP:  [place] [id number] [phone]   REASON FOR VISIT:  Follow-up for MGUS    INTERVAL HISTORY:  [name] 85 y.o. male returns for routine follow-up. He is here today alone. He states that he has shortness of breath at times. He has noticed fatigue since his last visit as well. He states that he is much weaker than last time. Denies any nausea, vomiting, or diarrhea. Denies any new pains. Had not noticed any recent bleeding such as epistaxis, hematuria or hematochezia. Denies recent chest pain on exertion,  pre-syncopal episodes, or palpitations. Denies any numbness or tingling in hands or feet. Denies any recent fevers, infections, or recent hospitalizations. Patient reports appetite at 100% and energy level at 25%.    REVIEW OF SYSTEMS:  Review of Systems  Constitutional: Positive for fatigue.  Respiratory: Positive for shortness of breath.      PAST MEDICAL/SURGICAL HISTORY:  Past Medical History:  Diagnosis Date  . Acute ST elevation myocardial infarction (STEMI) of inferior wall (HCC) 11/03/2017   PCI/DESx3 to RCA after wire dissection. EF 45-50%  . BPH (benign prostatic hyperplasia)   . CAD (coronary artery disease)    Multivessel/left main disease  . Hypothyroidism   . Mitral regurgitation   . Mixed hyperlipidemia   . Type 2 diabetes mellitus ([place])    Past Surgical History:  Procedure Laterality Date  . CORONARY STENT INTERVENTION N/A 11/03/2017   Procedure: CORONARY STENT INTERVENTION;  Surgeon: Lorretta Harp, MD;  Location: [facility];  Service: Cardiovascular;  Laterality: N/A;  . CORONARY/GRAFT ACUTE MI REVASCULARIZATION N/A 11/03/2017   Procedure: Coronary/Graft Acute MI Revascularization;  Surgeon: Lorretta Harp, MD;  Location: [facility];  Service: Cardiovascular;  Laterality: N/A;   . LEFT HEART CATH AND CORONARY ANGIOGRAPHY N/A 11/03/2017   Procedure: LEFT HEART CATH AND CORONARY ANGIOGRAPHY;  Surgeon: Lorretta Harp, MD;  Location: [facility];  Service: Cardiovascular;  Laterality: N/A;  . STOMACH SURGERY     Treatment of an abscess     SOCIAL HISTORY:  Social History   Socioeconomic History  . Marital status: Married    Spouse name: Not on file  . Number of children: 1  . Years of education: Not on file  . Highest education level: Not on file  Occupational History  . Not on file  Social Needs  . Financial resource strain: Not hard at all  . Food insecurity:    Worry: Never true    Inability: Never true  . Transportation needs:    Medical: No    Non-medical: No  Tobacco Use  . Smoking status: Former Smoker    Packs/day: 1.00    Years: 40.00    Pack years: 40.00    Types: Cigarettes    Last attempt to quit: 06/26/1982    Years since quitting: 36.4  . Smokeless tobacco: Never Used  Substance and Sexual Activity  . Alcohol use: No  . Drug use: No  . Sexual activity: Not on file  Lifestyle  . Physical activity:    Days per week: 0 days    Minutes per session: 0 min  . Stress: Not at all  Relationships  . Social connections:    Talks on phone: Once a week    Gets together: Once a week  Attends religious service: More than 4 times per year    Active member of club or organization: Yes    Attends meetings of clubs or organizations: More than 4 times per year    Relationship status: Married  . Intimate partner violence:    Fear of current or ex partner: No    Emotionally abused: No    Physically abused: No    Forced sexual activity: No  Other Topics Concern  . Not on file  Social History Narrative  . Not on file    FAMILY HISTORY:  Family History  Problem Relation Age of Onset  . Heart failure Father 76  . Parkinsonism Mother 19  . Lung cancer Brother     CURRENT MEDICATIONS:  Outpatient Encounter Medications as of  11/14/2018  Medication Sig  . aspirin EC 81 MG EC tablet Take 1 tablet (81 mg total) by mouth daily.  Marland Kitchen atorvastatin (LIPITOR) 80 MG tablet Take 1 tablet (80 mg total) by mouth daily.  . carvedilol (COREG) 3.125 MG tablet Take 1 tablet (3.125 mg total) by mouth 2 (two) times daily with a meal.  . clopidogrel (PLAVIX) 75 MG tablet Take 1 tablet (75 mg total) by mouth daily.  . feeding supplement, ENSURE ENLIVE, (ENSURE ENLIVE) LIQD Take 237 mLs by mouth 2 (two) times daily between meals.  . finasteride (PROSCAR) 5 MG tablet Take 5 mg by mouth daily.  . furosemide (LASIX) 20 MG tablet Take 1 tablet (20 mg total) by mouth every other day for 5 days. Per pt he stops taking this medication 10/17/18. (Patient taking differently: Take 20 mg by mouth every other day. )  . Iron-FA-B Cmp-C-Biot-Probiotic (FUSION PLUS) CAPS Take by mouth.  . levothyroxine (SYNTHROID, LEVOTHROID) 50 MCG tablet Take 50 mcg by mouth daily.   . nitroGLYCERIN (NITROSTAT) 0.4 MG SL tablet Place 1 tablet (0.4 mg total) under the tongue every 5 (five) minutes x 3 doses as needed for chest pain.  . pantoprazole (PROTONIX) 40 MG tablet Take 40 mg by mouth 2 (two) times daily.  . tamsulosin (FLOMAX) 0.4 MG CAPS capsule Take 0.4 mg by mouth daily.    No facility-administered encounter medications on file as of 11/14/2018.     ALLERGIES:  No Known Allergies   PHYSICAL EXAM:  ECOG Performance status: 1  Vitals:   11/14/18 1100  BP: (!) 133/57  Pulse: 65  Resp: 16  Temp: 97.7 F (36.5 C)  SpO2: 100%   Filed Weights   11/14/18 1100  Weight: 160 lb 4 oz (72.7 kg)    Physical Exam Vitals signs reviewed.  Constitutional:      Appearance: Normal appearance.  Cardiovascular:     Rate and Rhythm: Normal rate and regular rhythm.  Pulmonary:     Effort: Pulmonary effort is normal.     Breath sounds: Normal breath sounds.  Abdominal:     General: There is no distension.     Palpations: Abdomen is soft. There is no mass.   Musculoskeletal:     Right lower leg: Edema present.     Left lower leg: Edema present.  Skin:    General: Skin is warm.  Neurological:     Mental Status: He is alert and oriented to person, place, and time.  Psychiatric:        Mood and Affect: Mood normal.        Behavior: Behavior normal.      LABORATORY DATA:  I have reviewed the labs as  listed.  CBC    Component Value Date/Time   WBC 5.4 11/14/2018 1054   RBC 2.41 (L) 11/14/2018 1054   HGB 7.8 (L) 11/14/2018 1054   HCT 25.3 (L) 11/14/2018 1054   HCT 25.3 (L) 04/01/2018 1446   PLT 206 11/14/2018 1054   MCV 105.0 (H) 11/14/2018 1054   MCH 32.4 11/14/2018 1054   MCHC 30.8 11/14/2018 1054   RDW 15.5 11/14/2018 1054   LYMPHSABS 1.0 11/14/2018 1054   MONOABS 0.8 11/14/2018 1054   EOSABS 0.2 11/14/2018 1054   BASOSABS 0.0 11/14/2018 1054   CMP Latest Ref Rng & Units 09/30/2018 04/05/2018 04/04/2018  Glucose 70 - 99 mg/dL 102(H) 115(H) 123(H)  BUN 8 - 23 mg/dL 51(H) 64(H) 63(H)  Creatinine 0.61 - 1.24 mg/dL 3.05(H) 2.28(H) 2.36(H)  Sodium 135 - 145 mmol/L 139 140 139  Potassium 3.5 - 5.1 mmol/L 5.1 4.5 4.5  Chloride 98 - 111 mmol/L 111 105 108  CO2 22 - 32 mmol/L 20(L) 24 24  Calcium 8.9 - 10.3 mg/dL 8.6(L) 8.7(L) 8.4(L)  Total Protein 6.5 - 8.1 g/dL 7.3 - -  Total Bilirubin 0.3 - 1.2 mg/dL 0.4 - -  Alkaline Phos 38 - 126 U/L 68 - -  AST 15 - 41 U/L 21 - -  ALT 0 - 44 U/L 24 - -       DIAGNOSTIC IMAGING:  I have independently reviewed the scans and discussed with the patient.   I have reviewed Venita Lick LPN's note and agree with the documentation.  I personally performed a face-to-face visit, made revisions and my assessment and plan is as follows.    ASSESSMENT & PLAN:   MGUS (monoclonal gammopathy of unknown significance) 1.  MGUS: - Work-up for CKD on 08/06/2018 showed 6.9% M spike on 24-hour urine. - Denies any new onset bone pains. -We reviewed results of skeletal survey from 10/12/2018.  There  were no lytic lesions. -Multiple Myeloma Labs significant for M-spike: 0.8, Kappa: 1451.8, Lambda: 22.7, ratio: 63.96, IFE: IgG kappa  -Discussed findings with patient. Recommend we proceed with Bone Marrow Biopsy to determine percent of plasma cells in the marrow. Pt agrees to proceed with procedure. He will RTC in 2 weeks.   2.  Normocytic anemia: - History of blood transfusion in June 2019 and February 2020 for hemoglobin of 7.2 on 08/06/2018. - On 08/23/2018, his labs show hemoglobin of 9.2, MCV of 94, normal white count and platelet count. -He took iron tablet in the past which caused severe constipation. -Colonoscopy many years ago.  Occasional blood on the tissue which he thinks from hemorrhoids. - His Z61, folic acid, ferritin and iron panel were normal. -He had to receive 2 units of PRBC on 10/11/2018 for hemoglobin of 7. -He was reportedly seen in the ER on 10/12/2018 for difficulty breathing and was started back on Lasix. - Procrit 20,000 units every 2 weeks was started on 10/16/2018. - Hgb today is 7.8. He reports fatigue and DOE. -He was receiving Procrit 20,000 units every 2 weeks.  I will increase it to 30,000 units weekly.  Total time spent is 25 minutes with more than 50% of the time spent face-to-face discussing new findings, treatment changes, bone marrow biopsy and coordination of care.    Orders placed this encounter:  No orders of the defined types were placed in this encounter.     Derek Jack, MD [place] [phone]

## 2018-11-14 NOTE — Patient Instructions (Addendum)
[place] at [facility] Discharge Instructions  You were seen today by Dr. Delton Coombes. He went over your recent lab results. He would like you to have a bone marrow biopsy. He will see you back in 2 weeks for labs, bone marrow biopsy and follow up.   Thank you for choosing [place] at [facility] to provide your oncology and hematology care.  To afford each patient quality time with our provider, please arrive at least 15 minutes before your scheduled appointment time.   If you have a lab appointment with the [facility] please come in thru the  Main Entrance and check in at the main information desk  You need to re-schedule your appointment should you arrive 10 or more minutes late.  We strive to give you quality time with our providers, and arriving late affects you and other patients whose appointments are after yours.  Also, if you no show three or more times for appointments you may be dismissed from the clinic at the providers discretion.     Again, thank you for choosing [facility].  Our hope is that these requests will decrease the amount of time that you wait before being seen by our physicians.       _____________________________________________________________  Should you have questions after your visit to [facility], please contact our office at [phone] between the hours of 8:00 a.m. and 4:30 p.m.  Voicemails left after 4:00 p.m. will not be returned until the following business day.  For prescription refill requests, have your pharmacy contact our office and allow 72 hours.    Cancer Center Support Programs:   > Cancer Support Group  2nd Tuesday of the month 1pm-2pm, Journey Room

## 2018-11-15 ENCOUNTER — Ambulatory Visit: Payer: Medicare Other | Admitting: Cardiology

## 2018-11-21 ENCOUNTER — Other Ambulatory Visit: Payer: Self-pay

## 2018-11-21 ENCOUNTER — Inpatient Hospital Stay (HOSPITAL_COMMUNITY): Payer: Medicare Other

## 2018-11-21 ENCOUNTER — Encounter (HOSPITAL_COMMUNITY): Payer: Self-pay

## 2018-11-21 VITALS — BP 147/55 | HR 57 | Temp 98.1°F | Resp 18

## 2018-11-21 DIAGNOSIS — D472 Monoclonal gammopathy: Secondary | ICD-10-CM | POA: Diagnosis not present

## 2018-11-21 DIAGNOSIS — N183 Chronic kidney disease, stage 3 (moderate): Secondary | ICD-10-CM | POA: Diagnosis not present

## 2018-11-21 DIAGNOSIS — N189 Chronic kidney disease, unspecified: Secondary | ICD-10-CM

## 2018-11-21 DIAGNOSIS — D649 Anemia, unspecified: Secondary | ICD-10-CM

## 2018-11-21 LAB — CBC WITH DIFFERENTIAL/PLATELET
Abs Immature Granulocytes: 0.02 10*3/uL (ref 0.00–0.07)
Basophils Absolute: 0 10*3/uL (ref 0.0–0.1)
Basophils Relative: 0 %
Eosinophils Absolute: 0.3 10*3/uL (ref 0.0–0.5)
Eosinophils Relative: 5 %
HCT: 26.4 % — ABNORMAL LOW (ref 39.0–52.0)
Hemoglobin: 7.9 g/dL — ABNORMAL LOW (ref 13.0–17.0)
Immature Granulocytes: 0 %
Lymphocytes Relative: 21 %
Lymphs Abs: 1.1 10*3/uL (ref 0.7–4.0)
MCH: 32.4 pg (ref 26.0–34.0)
MCHC: 29.9 g/dL — ABNORMAL LOW (ref 30.0–36.0)
MCV: 108.2 fL — ABNORMAL HIGH (ref 80.0–100.0)
Monocytes Absolute: 0.7 10*3/uL (ref 0.1–1.0)
Monocytes Relative: 13 %
Neutro Abs: 3.3 10*3/uL (ref 1.7–7.7)
Neutrophils Relative %: 61 %
Platelets: 206 10*3/uL (ref 150–400)
RBC: 2.44 MIL/uL — ABNORMAL LOW (ref 4.22–5.81)
RDW: 15.8 % — ABNORMAL HIGH (ref 11.5–15.5)
WBC: 5.4 10*3/uL (ref 4.0–10.5)
nRBC: 0 % (ref 0.0–0.2)

## 2018-11-21 MED ORDER — EPOETIN ALFA 40000 UNIT/ML IJ SOLN
30000.0000 [IU] | Freq: Once | INTRAMUSCULAR | Status: AC
  Administered 2018-11-21: 30000 [IU] via SUBCUTANEOUS
  Filled 2018-11-21: qty 1

## 2018-11-21 NOTE — Progress Notes (Signed)
Randy Fitzpatrick. tolerated Procrit injection well without complaints or incident. Hgb 7.9 today. VSS Pt discharged via wheelchair in satisfactory condition

## 2018-11-21 NOTE — Patient Instructions (Signed)
[place] at [facility] Discharge Instructions  Received Epogen injection today. Follow-up as scheduled. Call clinic for any questions or concerns   Thank you for choosing [place] at [facility] to provide your oncology and hematology care.  To afford each patient quality time with our provider, please arrive at least 15 minutes before your scheduled appointment time.   If you have a lab appointment with the [place] please come in thru the  Main Entrance and check in at the main information desk  You need to re-schedule your appointment should you arrive 10 or more minutes late.  We strive to give you quality time with our providers, and arriving late affects you and other patients whose appointments are after yours.  Also, if you no show three or more times for appointments you may be dismissed from the clinic at the providers discretion.     Again, thank you for choosing [facility].  Our hope is that these requests will decrease the amount of time that you wait before being seen by our physicians.       _____________________________________________________________  Should you have questions after your visit to [facility], please contact our office at [phone] between the hours of 8:00 a.m. and 4:30 p.m.  Voicemails left after 4:00 p.m. will not be returned until the following business day.  For prescription refill requests, have your pharmacy contact our office and allow 72 hours.    Cancer Center Support Programs:   > Cancer Support Group  2nd Tuesday of the month 1pm-2pm, Journey Room

## 2018-11-26 ENCOUNTER — Inpatient Hospital Stay (HOSPITAL_COMMUNITY): Payer: Medicare Other | Attending: Hematology | Admitting: Hematology

## 2018-11-26 ENCOUNTER — Encounter (HOSPITAL_COMMUNITY): Payer: Self-pay | Admitting: Hematology

## 2018-11-26 ENCOUNTER — Other Ambulatory Visit: Payer: Self-pay

## 2018-11-26 ENCOUNTER — Inpatient Hospital Stay (HOSPITAL_COMMUNITY): Payer: Medicare Other

## 2018-11-26 VITALS — Wt 156.7 lb

## 2018-11-26 DIAGNOSIS — N183 Chronic kidney disease, stage 3 (moderate): Secondary | ICD-10-CM | POA: Insufficient documentation

## 2018-11-26 DIAGNOSIS — N189 Chronic kidney disease, unspecified: Secondary | ICD-10-CM

## 2018-11-26 DIAGNOSIS — E1122 Type 2 diabetes mellitus with diabetic chronic kidney disease: Secondary | ICD-10-CM | POA: Insufficient documentation

## 2018-11-26 DIAGNOSIS — I2510 Atherosclerotic heart disease of native coronary artery without angina pectoris: Secondary | ICD-10-CM | POA: Insufficient documentation

## 2018-11-26 DIAGNOSIS — E782 Mixed hyperlipidemia: Secondary | ICD-10-CM | POA: Diagnosis not present

## 2018-11-26 DIAGNOSIS — E039 Hypothyroidism, unspecified: Secondary | ICD-10-CM | POA: Insufficient documentation

## 2018-11-26 DIAGNOSIS — D631 Anemia in chronic kidney disease: Secondary | ICD-10-CM | POA: Diagnosis present

## 2018-11-26 DIAGNOSIS — D472 Monoclonal gammopathy: Secondary | ICD-10-CM | POA: Diagnosis not present

## 2018-11-26 DIAGNOSIS — Z87891 Personal history of nicotine dependence: Secondary | ICD-10-CM | POA: Diagnosis not present

## 2018-11-26 LAB — CBC WITH DIFFERENTIAL/PLATELET
Abs Immature Granulocytes: 0.03 10*3/uL (ref 0.00–0.07)
Basophils Absolute: 0 10*3/uL (ref 0.0–0.1)
Basophils Relative: 0 %
Eosinophils Absolute: 0.3 10*3/uL (ref 0.0–0.5)
Eosinophils Relative: 5 %
HCT: 28 % — ABNORMAL LOW (ref 39.0–52.0)
Hemoglobin: 8.4 g/dL — ABNORMAL LOW (ref 13.0–17.0)
Immature Granulocytes: 1 %
Lymphocytes Relative: 16 %
Lymphs Abs: 0.9 10*3/uL (ref 0.7–4.0)
MCH: 32.2 pg (ref 26.0–34.0)
MCHC: 30 g/dL (ref 30.0–36.0)
MCV: 107.3 fL — ABNORMAL HIGH (ref 80.0–100.0)
Monocytes Absolute: 0.7 10*3/uL (ref 0.1–1.0)
Monocytes Relative: 12 %
Neutro Abs: 3.9 10*3/uL (ref 1.7–7.7)
Neutrophils Relative %: 66 %
Platelets: 214 10*3/uL (ref 150–400)
RBC: 2.61 MIL/uL — ABNORMAL LOW (ref 4.22–5.81)
RDW: 16.3 % — ABNORMAL HIGH (ref 11.5–15.5)
WBC: 5.8 10*3/uL (ref 4.0–10.5)
nRBC: 0 % (ref 0.0–0.2)

## 2018-11-26 MED ORDER — LIDOCAINE HCL (PF) 1 % IJ SOLN
INTRAMUSCULAR | Status: AC
  Filled 2018-11-26: qty 5

## 2018-11-26 NOTE — Progress Notes (Signed)
INDICATION:    Bone Marrow Biopsy and Aspiration Procedure Note   The patient was identified by name and date of birth, prior to start of the procedure and a timeout was performed.   An informed consent was obtained after discussing potential risks including bleeding, infection and pain.  The right posterior iliac crest was palpated, cleaned with ChloraPrep, and drapes applied.  1% lidocaine is infiltrated into the skin, subcutaneous tissue and periosteum.  Bone marrow was aspirated and smears made.  With the help of Jamshidi needle a core biopsy was obtained.  Pressure was applied to the biopsy site and bandage was placed over the biopsy site. Patient was made to lie on the back for 15 mins prior to discharge.  The procedure was tolerated well. COMPLICATIONS: None BLOOD LOSS: none Patient was discharged home in stable condition to return in 2 weeks to review results.  Patient was provided with post bone marrow biopsy instructions and instructed to call if there was any bleeding or worsening pain.  Specimens sent for flow cytometry, cytogenetics and additional studies.  Signed Derek Jack, MD

## 2018-11-26 NOTE — Assessment & Plan Note (Signed)
1.  MGUS: - Work-up for CKD on 08/06/2018 showed 6.9% M spike on 24-hour urine. - Skeletal survey on 10/12/2018 did not show any lytic lesions. -Myeloma labs showed M spike 0.8 g of IgG kappa protein.  Kappa light chains are elevated at 1451.  Lambda light chains are 22 and ratio is 63.96. - We talked about further work-up including a bone marrow aspiration and biopsy.  We talked about the complications of the procedure.  We will proceed with the procedure today. -We will see him back in 2 weeks for follow-up and discuss results.    2.  Macrocytic anemia: -This is likely from a combination of CKD and likely early MDS. -He was requiring intermittent blood transfusions. -Procrit 20,000 units every 2 weeks started on 10/16/2018.  We have increased the dose to 30,000 units weekly on 11/14/2018. -Hemoglobin improved to 8.4 today. -Nutritional work-up including S56, folic acid and iron panel were normal. - We will continue Procrit at the same dose level.  We will proceed with his bone marrow aspiration and biopsy.

## 2018-11-26 NOTE — Progress Notes (Signed)
Randy Fitzpatrick, Randy Fitzpatrick   CLINIC:  Medical Oncology/Hematology  PCP:  [id number] [phone]   REASON FOR VISIT:  Follow-up for MGUS    INTERVAL HISTORY:  Randy Fitzpatrick 85 y.o. male is seen today for follow-up of macrocytic anemia and MGUS.  He is tolerating weekly Procrit very well.  Blood pressure is staying stable.  He feels energetic since the start of weekly Procrit.  Denies any major bleeding issues.  Appetite is 75%.  Energy levels are 50%.  Denies any nausea, vomiting or diarrhea.  Denies any recent hospitalizations or infections.  He is continuing to function well and is independent of all ADLs and IADLs.    REVIEW OF SYSTEMS:  Review of Systems  Constitutional: Positive for fatigue.  Respiratory: Negative for shortness of breath.   All other systems reviewed and are negative.    PAST MEDICAL/SURGICAL HISTORY:  Past Medical History:  Diagnosis Date  . Acute ST elevation myocardial infarction (STEMI) of inferior wall (HCC) 11/03/2017   PCI/DESx3 to RCA after wire dissection. EF 45-50%  . BPH (benign prostatic hyperplasia)   . CAD (coronary artery disease)    Multivessel/left main disease  . Hypothyroidism   . Mitral regurgitation   . Mixed hyperlipidemia   . Type 2 diabetes mellitus ([place])    Past Surgical History:  Procedure Laterality Date  . CORONARY STENT INTERVENTION N/A 11/03/2017   Procedure: CORONARY STENT INTERVENTION;  Surgeon: Lorretta Harp, MD;  Location: [place] [facility];  Service: Cardiovascular;  Laterality: N/A;  . CORONARY/GRAFT ACUTE MI REVASCULARIZATION N/A 11/03/2017   Procedure: Coronary/Graft Acute MI Revascularization;  Surgeon: Lorretta Harp, MD;  Location: [place] [facility];  Service: Cardiovascular;  Laterality: N/A;  . LEFT HEART CATH AND CORONARY ANGIOGRAPHY N/A 11/03/2017   Procedure: LEFT HEART CATH AND CORONARY ANGIOGRAPHY;  Surgeon:  Lorretta Harp, MD;  Location: [place] [facility];  Service: Cardiovascular;  Laterality: N/A;  . STOMACH SURGERY     Treatment of an abscess     SOCIAL HISTORY:  Social History   Socioeconomic History  . Marital status: Married    Spouse name: Not on file  . Number of children: 1  . Years of education: Not on file  . Highest education level: Not on file  Occupational History  . Not on file  Social Needs  . Financial resource strain: Not hard at all  . Food insecurity:    Worry: Never true    Inability: Never true  . Transportation needs:    Medical: No    Non-medical: No  Tobacco Use  . Smoking status: Former Smoker    Packs/day: 1.00    Years: 40.00    Pack years: 40.00    Types: Cigarettes    Last attempt to quit: 06/26/1982    Years since quitting: 36.4  . Smokeless tobacco: Never Used  Substance and Sexual Activity  . Alcohol use: No  . Drug use: No  . Sexual activity: Not on file  Lifestyle  . Physical activity:    Days per week: 0 days    Minutes per session: 0 min  . Stress: Not at all  Relationships  . Social connections:    Talks on phone: Once a week    Gets together: Once a week    Attends religious service: More than 4 times per year    Active  member of club or organization: Yes    Attends meetings of clubs or organizations: More than 4 times per year    Relationship status: Married  . Intimate partner violence:    Fear of current or ex partner: No    Emotionally abused: No    Physically abused: No    Forced sexual activity: No  Other Topics Concern  . Not on file  Social History Narrative  . Not on file    FAMILY HISTORY:  Family History  Problem Relation Age of Onset  . Heart failure Father 65  . Parkinsonism Mother 21  . Lung cancer Brother     CURRENT MEDICATIONS:  Outpatient Encounter Medications as of 11/26/2018  Medication Sig  . aspirin EC 81 MG EC tablet Take 1 tablet (81 mg total) by mouth daily.  Marland Kitchen atorvastatin (LIPITOR)  80 MG tablet Take 1 tablet (80 mg total) by mouth daily.  . carvedilol (COREG) 3.125 MG tablet Take 1 tablet (3.125 mg total) by mouth 2 (two) times daily with a meal.  . clopidogrel (PLAVIX) 75 MG tablet Take 1 tablet (75 mg total) by mouth daily.  . finasteride (PROSCAR) 5 MG tablet Take 5 mg by mouth daily.  . furosemide (LASIX) 20 MG tablet Take 1 tablet (20 mg total) by mouth every other day for 5 days. Per pt he stops taking this medication 10/17/18. (Patient taking differently: Take 20 mg by mouth every other day. )  . levothyroxine (SYNTHROID, LEVOTHROID) 50 MCG tablet Take 50 mcg by mouth daily.   . pantoprazole (PROTONIX) 40 MG tablet Take 40 mg by mouth 2 (two) times daily.  . tamsulosin (FLOMAX) 0.4 MG CAPS capsule Take 0.4 mg by mouth daily.   . nitroGLYCERIN (NITROSTAT) 0.4 MG SL tablet Place 1 tablet (0.4 mg total) under the tongue every 5 (five) minutes x 3 doses as needed for chest pain. (Patient not taking: Reported on 11/26/2018)  . [DISCONTINUED] feeding supplement, ENSURE ENLIVE, (ENSURE ENLIVE) LIQD Take 237 mLs by mouth 2 (two) times daily between meals.  . [DISCONTINUED] Iron-FA-B Cmp-C-Biot-Probiotic (FUSION PLUS) CAPS Take by mouth.   No facility-administered encounter medications on file as of 11/26/2018.     ALLERGIES:  No Known Allergies   PHYSICAL EXAM:  ECOG Performance status: 1  There were no vitals filed for this visit. Filed Weights   11/26/18 0941  Weight: 156 lb 11.2 oz (71.1 kg)    Physical Exam Vitals signs reviewed.  Constitutional:      Appearance: Normal appearance.  Cardiovascular:     Rate and Rhythm: Normal rate and regular rhythm.  Pulmonary:     Effort: Pulmonary effort is normal.     Breath sounds: Normal breath sounds.  Abdominal:     General: There is no distension.     Palpations: Abdomen is soft. There is no mass.  Musculoskeletal:     Right lower leg: Edema present.     Left lower leg: Edema present.  Skin:    General: Skin  is warm.  Neurological:     Mental Status: He is alert and oriented to person, place, and time.  Psychiatric:        Mood and Affect: Mood normal.        Behavior: Behavior normal.      LABORATORY DATA:  I have reviewed the labs as listed.  CBC    Component Value Date/Time   WBC 5.8 11/26/2018 1005   RBC 2.61 (L) 11/26/2018 1005  HGB 8.4 (L) 11/26/2018 1005   HCT 28.0 (L) 11/26/2018 1005   HCT 25.3 (L) 04/01/2018 1446   PLT 214 11/26/2018 1005   MCV 107.3 (H) 11/26/2018 1005   MCH 32.2 11/26/2018 1005   MCHC 30.0 11/26/2018 1005   RDW 16.3 (H) 11/26/2018 1005   LYMPHSABS 0.9 11/26/2018 1005   MONOABS 0.7 11/26/2018 1005   EOSABS 0.3 11/26/2018 1005   BASOSABS 0.0 11/26/2018 1005   CMP Latest Ref Rng & Units 09/30/2018 04/05/2018 04/04/2018  Glucose 70 - 99 mg/dL 102(H) 115(H) 123(H)  BUN 8 - 23 mg/dL 51(H) 64(H) 63(H)  Creatinine 0.61 - 1.24 mg/dL 3.05(H) 2.28(H) 2.36(H)  Sodium 135 - 145 mmol/L 139 140 139  Potassium 3.5 - 5.1 mmol/L 5.1 4.5 4.5  Chloride 98 - 111 mmol/L 111 105 108  CO2 22 - 32 mmol/L 20(L) 24 24  Calcium 8.9 - 10.3 mg/dL 8.6(L) 8.7(L) 8.4(L)  Total Protein 6.5 - 8.1 g/dL 7.3 - -  Total Bilirubin 0.3 - 1.2 mg/dL 0.4 - -  Alkaline Phos 38 - 126 U/L 68 - -  AST 15 - 41 U/L 21 - -  ALT 0 - 44 U/L 24 - -       DIAGNOSTIC IMAGING:  I have independently reviewed the scans and discussed with the patient.   I have reviewed Venita Lick LPN's note and agree with the documentation.  I personally performed a face-to-face visit, made revisions and my assessment and plan is as follows.    ASSESSMENT & PLAN:   MGUS (monoclonal gammopathy of unknown significance) 1.  MGUS: - Work-up for CKD on 08/06/2018 showed 6.9% M spike on 24-hour urine. - Skeletal survey on 10/12/2018 did not show any lytic lesions. -Myeloma labs showed M spike 0.8 g of IgG kappa protein.  Kappa light chains are elevated at 1451.  Lambda light chains are 22 and ratio is 63.96. -  We talked about further work-up including a bone marrow aspiration and biopsy.  We talked about the complications of the procedure.  We will proceed with the procedure today. -We will see him back in 2 weeks for follow-up and discuss results.    2.  Macrocytic anemia: -This is likely from a combination of CKD and likely early MDS. -He was requiring intermittent blood transfusions. -Procrit 20,000 units every 2 weeks started on 10/16/2018.  We have increased the dose to 30,000 units weekly on 11/14/2018. -Hemoglobin improved to 8.4 today. -Nutritional work-up including S82, folic acid and iron panel were normal. - We will continue Procrit at the same dose level.  We will proceed with his bone marrow aspiration and biopsy.   Total time spent is 25 minutes with more than 50% of the time spent face-to-face discussing new findings, treatment changes, bone marrow biopsy and coordination of care.    Orders placed this encounter:  Orders Placed This Encounter  Procedures  . CBC with Differential      Derek Jack, MD [place] [phone]

## 2018-11-26 NOTE — Patient Instructions (Signed)
[place] at [facility] Discharge Instructions  You were seen today by Dr. Delton Coombes. You had a bone marrow biopsy today. You may remove your dressing at 10:30 tomorrow. We will see you back in 2 weeks for follow up and to go over results.    Bone Marrow Aspiration and Bone Marrow Biopsy, Adult, Care After This sheet gives you information about how to care for yourself after your procedure. Your health care provider may also give you more specific instructions. If you have problems or questions, contact your health care provider. What can I expect after the procedure? After the procedure, it is common to have:  Mild pain and tenderness.  Swelling.  Bruising. Follow these instructions at home: Puncture site care       Follow instructions from your health care provider about how to take care of the puncture site. Make sure you: ? Wash your hands with soap and water before you change your bandage (dressing). If soap and water are not available, use hand sanitizer. ? Change your dressing as told by your health care provider.  Check your puncture siteevery day for signs of infection. Check for: ? More redness, swelling, or pain. ? More fluid or blood. ? Warmth. ? Pus or a bad smell. General instructions  Take over-the-counter and prescription medicines only as told by your health care provider.  Do not take baths, swim, or use a hot tub until your health care provider approves. Ask if you can take a shower or have a sponge bath.  Return to your normal activities as told by your health care provider. Ask your health care provider what activities are safe for you.  Keep all follow-up visits as told by your health care provider. This is important. Contact a health care provider if:  Your pain is not controlled with medicine. Get help right away if:  You have a fever.  You have more redness, swelling, or pain around the puncture site.  You have  more fluid or blood coming from the puncture site.  Your puncture site feels warm to the touch.  You have pus or a bad smell coming from the puncture site. These symptoms may represent a serious problem that is an emergency. Do not wait to see if the symptoms will go away. Get medical help right away. Call your local emergency services (911 in the [place] Do not drive yourself to the hospital. Summary  After the procedure, it is common to have mild pain, tenderness, swelling, and bruising.  Follow instructions from your health care provider about how to take care of the puncture site.  Get help right away if you have any symptoms of infection or if you have more blood or fluid coming from the puncture site. This information is not intended to replace advice given to you by your health care provider. Make sure you discuss any questions you have with your health care provider. Document Released: 12/30/2004 Document Revised: 09/25/2017 Document Reviewed: 11/24/2015 Elsevier Interactive Patient Education  2019 [employer].   Thank you for choosing [place] at [facility] to provide your oncology and hematology care.  To afford each patient quality time with our provider, please arrive at least 15 minutes before your scheduled appointment time.   If you have a lab appointment with the [place] please come in thru the  Main Entrance and check in at the main information desk  You need to re-schedule your appointment  should you arrive 10 or more minutes late.  We strive to give you quality time with our providers, and arriving late affects you and other patients whose appointments are after yours.  Also, if you no show three or more times for appointments you may be dismissed from the clinic at the providers discretion.     Again, thank you for choosing [facility].  Our hope is that these requests will decrease the amount of time that you wait before  being seen by our physicians.       _____________________________________________________________  Should you have questions after your visit to [facility], please contact our office at [phone] between the hours of 8:00 a.m. and 4:30 p.m.  Voicemails left after 4:00 p.m. will not be returned until the following business day.  For prescription refill requests, have your pharmacy contact our office and allow 72 hours.    Cancer Center Support Programs:   > Cancer Support Group  2nd Tuesday of the month 1pm-2pm, Journey Room

## 2018-11-26 NOTE — Progress Notes (Signed)
Pt is here today for bone marrow biopsy. VS _0 :49 are BP 148/66, Pulse 71, Resp 20, Temp 97.7, O2 sat 100%. CBC diff drawn from left AC. Procedure explained by Harriet Pho NP and consent signed. Pt placed in prone position with left arm under head and right arm by his side. Time out conducted at 10:16. Procedure started at 10:20 am. Pt tolerated well with no complaints. Dressing applied, clean, dry and intact. No bruising or swelling noted. Procedure complete at 10:35 am. Pt rolled onto back, sitting up with no complaints. Pt sipping ginger ale. VS 10:48 are BP 162/67, Pulse 70, Resp 20, Temp 98.0, O2 sat 100%. Pt discharged home ambulatory with cane. TO return in 2 weeks for follow up and results.

## 2018-11-27 ENCOUNTER — Other Ambulatory Visit: Payer: Self-pay

## 2018-11-28 ENCOUNTER — Telehealth (INDEPENDENT_AMBULATORY_CARE_PROVIDER_SITE_OTHER): Payer: Medicare Other | Admitting: Cardiology

## 2018-11-28 ENCOUNTER — Other Ambulatory Visit: Payer: Self-pay

## 2018-11-28 ENCOUNTER — Inpatient Hospital Stay (HOSPITAL_COMMUNITY): Payer: Medicare Other

## 2018-11-28 ENCOUNTER — Encounter: Payer: Self-pay | Admitting: Cardiology

## 2018-11-28 ENCOUNTER — Encounter (HOSPITAL_COMMUNITY): Payer: Self-pay

## 2018-11-28 VITALS — Ht 70.0 in | Wt 155.8 lb

## 2018-11-28 VITALS — BP 125/46 | HR 55 | Temp 98.5°F | Resp 16

## 2018-11-28 DIAGNOSIS — I25110 Atherosclerotic heart disease of native coronary artery with unstable angina pectoris: Secondary | ICD-10-CM

## 2018-11-28 DIAGNOSIS — N189 Chronic kidney disease, unspecified: Secondary | ICD-10-CM

## 2018-11-28 DIAGNOSIS — N184 Chronic kidney disease, stage 4 (severe): Secondary | ICD-10-CM | POA: Diagnosis not present

## 2018-11-28 DIAGNOSIS — I5022 Chronic systolic (congestive) heart failure: Secondary | ICD-10-CM | POA: Diagnosis not present

## 2018-11-28 DIAGNOSIS — D472 Monoclonal gammopathy: Secondary | ICD-10-CM | POA: Diagnosis not present

## 2018-11-28 DIAGNOSIS — N183 Chronic kidney disease, stage 3 (moderate): Secondary | ICD-10-CM | POA: Diagnosis not present

## 2018-11-28 DIAGNOSIS — Z8719 Personal history of other diseases of the digestive system: Secondary | ICD-10-CM

## 2018-11-28 DIAGNOSIS — D649 Anemia, unspecified: Secondary | ICD-10-CM

## 2018-11-28 DIAGNOSIS — I10 Essential (primary) hypertension: Secondary | ICD-10-CM

## 2018-11-28 LAB — CBC WITH DIFFERENTIAL/PLATELET
Abs Immature Granulocytes: 0.02 10*3/uL (ref 0.00–0.07)
Basophils Absolute: 0 10*3/uL (ref 0.0–0.1)
Basophils Relative: 0 %
Eosinophils Absolute: 0.3 10*3/uL (ref 0.0–0.5)
Eosinophils Relative: 5 %
HCT: 27.1 % — ABNORMAL LOW (ref 39.0–52.0)
Hemoglobin: 8.2 g/dL — ABNORMAL LOW (ref 13.0–17.0)
Immature Granulocytes: 0 %
Lymphocytes Relative: 19 %
Lymphs Abs: 1 10*3/uL (ref 0.7–4.0)
MCH: 32.7 pg (ref 26.0–34.0)
MCHC: 30.3 g/dL (ref 30.0–36.0)
MCV: 108 fL — ABNORMAL HIGH (ref 80.0–100.0)
Monocytes Absolute: 0.6 10*3/uL (ref 0.1–1.0)
Monocytes Relative: 11 %
Neutro Abs: 3.4 10*3/uL (ref 1.7–7.7)
Neutrophils Relative %: 65 %
Platelets: 199 10*3/uL (ref 150–400)
RBC: 2.51 MIL/uL — ABNORMAL LOW (ref 4.22–5.81)
RDW: 16.1 % — ABNORMAL HIGH (ref 11.5–15.5)
WBC: 5.3 10*3/uL (ref 4.0–10.5)
nRBC: 0 % (ref 0.0–0.2)

## 2018-11-28 MED ORDER — EPOETIN ALFA 20000 UNIT/ML IJ SOLN
20000.0000 [IU] | Freq: Once | INTRAMUSCULAR | Status: AC
  Administered 2018-11-28: 20000 [IU] via SUBCUTANEOUS
  Filled 2018-11-28: qty 1

## 2018-11-28 MED ORDER — EPOETIN ALFA 10000 UNIT/ML IJ SOLN
10000.0000 [IU] | Freq: Once | INTRAMUSCULAR | Status: AC
  Administered 2018-11-28: 10000 [IU] via SUBCUTANEOUS
  Filled 2018-11-28: qty 1

## 2018-11-28 NOTE — Progress Notes (Signed)
Virtual Visit via Telephone Note   This visit type was conducted due to national recommendations for restrictions regarding the COVID-19 Pandemic (e.g. social distancing) in an effort to limit this patient's exposure and mitigate transmission in our community.  Due to his co-morbid illnesses, this patient is at least at moderate risk for complications without adequate follow up.  This format is felt to be most appropriate for this patient at this time.  The patient did not have access to video technology/had technical difficulties with video requiring transitioning to audio format only (telephone).  All issues noted in this document were discussed and addressed.  No physical exam could be performed with this format.  Please refer to the patient's chart for his  consent to telehealth for Randy Fitzpatrick.   Date:  11/28/2018   ID:  Randy Fitzpatrick., DOB Apr 04, 1925, MRN [id number]  Patient Location: Home Provider Location: Office  PCP:  [name], Emme Rosenau [place], MD  Cardiologist:  [name], MD Electrophysiologist:  None   Evaluation Performed:  Follow-Up Visit  Chief Complaint:   Cardiac follow-up  History of Present Illness:    Randy Fitzpatrick. is a 85 y.o. male last assessed in April.  He did not have video access and we spoke by phone today.  He does not report any hospitalizations or worsening cardiac symptoms since last encounter.  At the last encounter we continued Lasix at 20 mg every other day.  Since then he has cut this back to on an as-needed basis.  He reports adequate control of intermittent shortness of breath and fluid retention.  He is also following with Dr. Delton Coombes for management of macrocytic anemia and MGUS.  He just underwent a bone marrow biopsy in fact, results pending.  He is on Procrit weekly.  I reviewed his additional cardiac medications which are stable and outlined below.  He does not report any changes in his stools or obvious bleeding.  The patient  does not have symptoms concerning for COVID-19 infection (fever, chills, cough, or new shortness of breath).    Past Medical History:  Diagnosis Date  . Acute ST elevation myocardial infarction (STEMI) of inferior wall (HCC) 11/03/2017   PCI/DESx3 to RCA after wire dissection. EF 45-50%  . BPH (benign prostatic hyperplasia)   . CAD (coronary artery disease)    Multivessel/left main disease  . Hypothyroidism   . Mitral regurgitation   . Mixed hyperlipidemia   . Type 2 diabetes mellitus ([place])    Past Surgical History:  Procedure Laterality Date  . CORONARY STENT INTERVENTION N/A 11/03/2017   Procedure: CORONARY STENT INTERVENTION;  Surgeon: Lorretta Harp, MD;  Location: [facility];  Service: Cardiovascular;  Laterality: N/A;  . CORONARY/GRAFT ACUTE MI REVASCULARIZATION N/A 11/03/2017   Procedure: Coronary/Graft Acute MI Revascularization;  Surgeon: Lorretta Harp, MD;  Location: [facility];  Service: Cardiovascular;  Laterality: N/A;  . LEFT HEART CATH AND CORONARY ANGIOGRAPHY N/A 11/03/2017   Procedure: LEFT HEART CATH AND CORONARY ANGIOGRAPHY;  Surgeon: Lorretta Harp, MD;  Location: [facility];  Service: Cardiovascular;  Laterality: N/A;  . STOMACH SURGERY     Treatment of an abscess     Current Meds  Medication Sig  . aspirin EC 81 MG EC tablet Take 1 tablet (81 mg total) by mouth daily.  Marland Kitchen atorvastatin (LIPITOR) 80 MG tablet Take 1 tablet (80 mg total) by mouth daily.  . carvedilol (COREG) 3.125 MG tablet Take  1 tablet (3.125 mg total) by mouth 2 (two) times daily with a meal.  . clopidogrel (PLAVIX) 75 MG tablet Take 1 tablet (75 mg total) by mouth daily.  . finasteride (PROSCAR) 5 MG tablet Take 5 mg by mouth daily.  . furosemide (LASIX) 20 MG tablet Take 20 mg by mouth daily as needed.  Marland Kitchen levothyroxine (SYNTHROID, LEVOTHROID) 50 MCG tablet Take 50 mcg by mouth daily.   . nitroGLYCERIN (NITROSTAT) 0.4 MG SL tablet Place 1 tablet (0.4 mg total)  under the tongue every 5 (five) minutes x 3 doses as needed for chest pain.  . pantoprazole (PROTONIX) 40 MG tablet Take 40 mg by mouth 2 (two) times daily.  . tamsulosin (FLOMAX) 0.4 MG CAPS capsule Take 0.4 mg by mouth daily.      Allergies:   Patient has no known allergies.   Social History   Tobacco Use  . Smoking status: Former Smoker    Packs/day: 1.00    Years: 40.00    Pack years: 40.00    Types: Cigarettes    Last attempt to quit: 06/26/1982    Years since quitting: 36.4  . Smokeless tobacco: Never Used  Substance Use Topics  . Alcohol use: No  . Drug use: No     Family Hx: The patient's family history includes Heart failure (age of onset: 46) in his father; Lung cancer in his brother; Parkinsonism (age of onset: 69) in his mother.  ROS:   Please see the history of present illness. All other systems reviewed and are negative.   Prior CV studies:   The following studies were reviewed today:  Echocardiogram February 2020 ([facility]): Mild LVH with LVEF 45 to 50%, inferoposterior wall motion abnormality, grade 1 diastolic dysfunction, aortic valve sclerosis without stenosis, and mild mitral regurgitation.  Labs/Other Tests and Data Reviewed:    EKG:  An ECG dated 10/12/2018 was personally reviewed today and demonstrated:  Sinus rhythm with lead motion artifact, leftward axis, IVCD.  Recent Labs: 03/31/2018: B Natriuretic Peptide 650.5 09/30/2018: ALT 24; BUN 51; Creatinine, Ser 3.05; Potassium 5.1; Sodium 139 11/26/2018: Hemoglobin 8.4; Platelets 214   Recent Lipid Panel Lab Results  Component Value Date/Time   CHOL 224 (H) 11/04/2017 02:42 AM   TRIG 263 (H) 11/04/2017 02:42 AM   HDL 32 (L) 11/04/2017 02:42 AM   CHOLHDL 7.0 11/04/2017 02:42 AM   LDLCALC 139 (H) 11/04/2017 02:42 AM    Wt Readings from Last 3 Encounters:  11/28/18 155 lb 12.8 oz (70.7 kg)  11/26/18 156 lb 11.2 oz (71.1 kg)  11/14/18 160 lb 4 oz (72.7 kg)     Objective:     Vital Signs:  Ht '5\' 10"'  (1.778 m)   Wt 155 lb 12.8 oz (70.7 kg)   BMI 22.35 kg/m    Patient was not able to check vital signs today. Patient spoke in full sentences, not short of breath. No audible wheezing or coughing.  ASSESSMENT & PLAN:    1.  Multivessel/left main CAD status post inferior STEMI in May 2019, DES x3 to the RCA.  He is over a year out from intervention, but tolerating dual antiplatelet therapy which I will continue for now in light of stent burden.  2.  CKD stage IV, last creatinine 3.05.  3.  Macrocytic anemia and MGUS, followed by hematology.  4.  Ischemic cardiomyopathy with mildly reduced LVEF, 45 to 50% range.  He is using Lasix on an as-needed basis at this  time and reports stable weight and symptoms.  COVID-19 Education: The signs and symptoms of COVID-19 were discussed with the patient and how to seek care for testing (follow up with PCP or arrange E-visit).  The importance of social distancing was discussed today.  Time:   Today, I have spent 6 minutes with the patient with telehealth technology discussing the above problems.     Medication Adjustments/Labs and Tests Ordered: Current medicines are reviewed at length with the patient today.  Concerns regarding medicines are outlined above.   Tests Ordered: No orders of the defined types were placed in this encounter.   Medication Changes: No orders of the defined types were placed in this encounter.   Disposition:  Follow up 3 months in the [place] office.  Signed, [name], MD  11/28/2018 9:43 AM    [place]

## 2018-11-28 NOTE — Patient Instructions (Addendum)
Medication Instructions:   Your physician recommends that you continue on your current medications as directed. Please refer to the Current Medication list given to you today.  Labwork:  NONE  Testing/Procedures:  NONE  Follow-Up:  Your physician recommends that you schedule a follow-up appointment in: 3 months in the office with Dr. McDowell.   Any Other Special Instructions Will Be Listed Below (If Applicable).  If you need a refill on your cardiac medications before your next appointment, please call your pharmacy. 

## 2018-11-28 NOTE — Patient Instructions (Signed)
[facility]  Discharge Instructions:   _______________________________________________________________  Thank you for choosing [facility] at [facility] to provide your oncology and hematology care.  To afford each patient quality time with our providers, please arrive at least 15 minutes before your scheduled appointment.  You need to re-schedule your appointment if you arrive 10 or more minutes late.  We strive to give you quality time with our providers, and arriving late affects you and other patients whose appointments are after yours.  Also, if you no show three or more times for appointments you may be dismissed from the clinic.  Again, thank you for choosing [facility] at [facility]. Our hope is that these requests will allow you access to exceptional care and in a timely manner. _______________________________________________________________  If you have questions after your visit, please contact our office at [phone] between the hours of 8:30 a.m. and 5:00 p.m. Voicemails left after 4:30 p.m. will not be returned until the following business day. _______________________________________________________________  For prescription refill requests, have your pharmacy contact our office. _______________________________________________________________  Recommendations made by the consultant and any test results will be sent to your referring physician. _______________________________________________________________ 

## 2018-11-28 NOTE — Progress Notes (Signed)
Procrit given per orders. Patient tolerated it well without problems. Vitals stable and discharged home from clinic ambulatory. Follow up as scheduled.

## 2018-12-03 ENCOUNTER — Encounter (HOSPITAL_COMMUNITY): Payer: Self-pay | Admitting: Hematology

## 2018-12-05 ENCOUNTER — Inpatient Hospital Stay (HOSPITAL_COMMUNITY): Payer: Medicare Other

## 2018-12-05 ENCOUNTER — Encounter (HOSPITAL_COMMUNITY): Payer: Self-pay

## 2018-12-05 ENCOUNTER — Other Ambulatory Visit: Payer: Self-pay

## 2018-12-05 VITALS — BP 132/45 | HR 53 | Temp 98.1°F | Resp 16

## 2018-12-05 DIAGNOSIS — N189 Chronic kidney disease, unspecified: Secondary | ICD-10-CM

## 2018-12-05 DIAGNOSIS — D649 Anemia, unspecified: Secondary | ICD-10-CM

## 2018-12-05 DIAGNOSIS — N183 Chronic kidney disease, stage 3 (moderate): Secondary | ICD-10-CM | POA: Diagnosis not present

## 2018-12-05 LAB — CBC WITH DIFFERENTIAL/PLATELET
Abs Immature Granulocytes: 0.02 10*3/uL (ref 0.00–0.07)
Basophils Absolute: 0 10*3/uL (ref 0.0–0.1)
Basophils Relative: 0 %
Eosinophils Absolute: 0.2 10*3/uL (ref 0.0–0.5)
Eosinophils Relative: 4 %
HCT: 27.5 % — ABNORMAL LOW (ref 39.0–52.0)
Hemoglobin: 8.4 g/dL — ABNORMAL LOW (ref 13.0–17.0)
Immature Granulocytes: 0 %
Lymphocytes Relative: 19 %
Lymphs Abs: 1 10*3/uL (ref 0.7–4.0)
MCH: 33.1 pg (ref 26.0–34.0)
MCHC: 30.5 g/dL (ref 30.0–36.0)
MCV: 108.3 fL — ABNORMAL HIGH (ref 80.0–100.0)
Monocytes Absolute: 0.5 10*3/uL (ref 0.1–1.0)
Monocytes Relative: 10 %
Neutro Abs: 3.7 10*3/uL (ref 1.7–7.7)
Neutrophils Relative %: 67 %
Platelets: 230 10*3/uL (ref 150–400)
RBC: 2.54 MIL/uL — ABNORMAL LOW (ref 4.22–5.81)
RDW: 15.6 % — ABNORMAL HIGH (ref 11.5–15.5)
WBC: 5.5 10*3/uL (ref 4.0–10.5)
nRBC: 0 % (ref 0.0–0.2)

## 2018-12-05 MED ORDER — EPOETIN ALFA 20000 UNIT/ML IJ SOLN
20000.0000 [IU] | Freq: Once | INTRAMUSCULAR | Status: AC
  Administered 2018-12-05: 20000 [IU] via SUBCUTANEOUS
  Filled 2018-12-05: qty 1

## 2018-12-05 MED ORDER — EPOETIN ALFA 10000 UNIT/ML IJ SOLN
10000.0000 [IU] | Freq: Once | INTRAMUSCULAR | Status: AC
  Administered 2018-12-05: 10000 [IU] via SUBCUTANEOUS
  Filled 2018-12-05: qty 1

## 2018-12-05 NOTE — Progress Notes (Signed)
Randy Fitzpatrick. tolerated Procrit injection well without complaints. Hgb 8.4 today. VSS Pt discharged via wheelchair in satisfactory condition

## 2018-12-05 NOTE — Patient Instructions (Signed)
[place] at [facility] Discharge Instructions  Received Procrit injection today. Follow-up as scheduled. Call clinic for any questions or concerns   Thank you for choosing [place] at [facility] to provide your oncology and hematology care.  To afford each patient quality time with our provider, please arrive at least 15 minutes before your scheduled appointment time.   If you have a lab appointment with the [place] please come in thru the  Main Entrance and check in at the main information desk  You need to re-schedule your appointment should you arrive 10 or more minutes late.  We strive to give you quality time with our providers, and arriving late affects you and other patients whose appointments are after yours.  Also, if you no show three or more times for appointments you may be dismissed from the clinic at the providers discretion.     Again, thank you for choosing [facility].  Our hope is that these requests will decrease the amount of time that you wait before being seen by our physicians.       _____________________________________________________________  Should you have questions after your visit to [facility], please contact our office at [phone] between the hours of 8:00 a.m. and 4:30 p.m.  Voicemails left after 4:00 p.m. will not be returned until the following business day.  For prescription refill requests, have your pharmacy contact our office and allow 72 hours.    Cancer Center Support Programs:   > Cancer Support Group  2nd Tuesday of the month 1pm-2pm, Journey Room

## 2018-12-09 ENCOUNTER — Other Ambulatory Visit: Payer: Self-pay

## 2018-12-10 ENCOUNTER — Encounter (HOSPITAL_COMMUNITY): Payer: Self-pay | Admitting: Hematology

## 2018-12-10 ENCOUNTER — Inpatient Hospital Stay (HOSPITAL_BASED_OUTPATIENT_CLINIC_OR_DEPARTMENT_OTHER): Payer: Medicare Other | Admitting: Hematology

## 2018-12-10 VITALS — BP 151/58 | HR 73 | Temp 97.6°F | Resp 16 | Wt 156.0 lb

## 2018-12-10 DIAGNOSIS — N189 Chronic kidney disease, unspecified: Secondary | ICD-10-CM | POA: Diagnosis not present

## 2018-12-10 DIAGNOSIS — D649 Anemia, unspecified: Secondary | ICD-10-CM

## 2018-12-10 DIAGNOSIS — D631 Anemia in chronic kidney disease: Secondary | ICD-10-CM

## 2018-12-10 DIAGNOSIS — C9000 Multiple myeloma not having achieved remission: Secondary | ICD-10-CM

## 2018-12-10 DIAGNOSIS — N183 Chronic kidney disease, stage 3 (moderate): Secondary | ICD-10-CM | POA: Diagnosis not present

## 2018-12-10 DIAGNOSIS — Z87891 Personal history of nicotine dependence: Secondary | ICD-10-CM | POA: Diagnosis not present

## 2018-12-10 DIAGNOSIS — D472 Monoclonal gammopathy: Secondary | ICD-10-CM

## 2018-12-10 LAB — CBC WITH DIFFERENTIAL/PLATELET
Abs Immature Granulocytes: 0.02 10*3/uL (ref 0.00–0.07)
Basophils Absolute: 0 10*3/uL (ref 0.0–0.1)
Basophils Relative: 0 %
Eosinophils Absolute: 0.2 10*3/uL (ref 0.0–0.5)
Eosinophils Relative: 3 %
HCT: 30.5 % — ABNORMAL LOW (ref 39.0–52.0)
Hemoglobin: 9.1 g/dL — ABNORMAL LOW (ref 13.0–17.0)
Immature Granulocytes: 0 %
Lymphocytes Relative: 15 %
Lymphs Abs: 0.7 10*3/uL (ref 0.7–4.0)
MCH: 31.6 pg (ref 26.0–34.0)
MCHC: 29.8 g/dL — ABNORMAL LOW (ref 30.0–36.0)
MCV: 105.9 fL — ABNORMAL HIGH (ref 80.0–100.0)
Monocytes Absolute: 0.5 10*3/uL (ref 0.1–1.0)
Monocytes Relative: 10 %
Neutro Abs: 3.3 10*3/uL (ref 1.7–7.7)
Neutrophils Relative %: 72 %
Platelets: 234 10*3/uL (ref 150–400)
RBC: 2.88 MIL/uL — ABNORMAL LOW (ref 4.22–5.81)
RDW: 15.5 % (ref 11.5–15.5)
WBC: 4.7 10*3/uL (ref 4.0–10.5)
nRBC: 0 % (ref 0.0–0.2)

## 2018-12-10 MED ORDER — EPOETIN ALFA 20000 UNIT/ML IJ SOLN
20000.0000 [IU] | Freq: Once | INTRAMUSCULAR | Status: AC
  Administered 2018-12-10: 12:00:00 20000 [IU] via SUBCUTANEOUS
  Filled 2018-12-10: qty 1

## 2018-12-10 MED ORDER — EPOETIN ALFA 10000 UNIT/ML IJ SOLN
10000.0000 [IU] | Freq: Once | INTRAMUSCULAR | Status: AC
  Administered 2018-12-10: 12:00:00 10000 [IU] via SUBCUTANEOUS
  Filled 2018-12-10: qty 1

## 2018-12-10 NOTE — Patient Instructions (Signed)
[place] at [facility] Discharge Instructions  You were seen today by Dr. Delton Coombes. He went over your recent lab results. He will see you back after scan for follow up.   Thank you for choosing [place] at [facility] to provide your oncology and hematology care.  To afford each patient quality time with our provider, please arrive at least 15 minutes before your scheduled appointment time.   If you have a lab appointment with the [facility] please come in thru the  Main Entrance and check in at the main information desk  You need to re-schedule your appointment should you arrive 10 or more minutes late.  We strive to give you quality time with our providers, and arriving late affects you and other patients whose appointments are after yours.  Also, if you no show three or more times for appointments you may be dismissed from the clinic at the providers discretion.     Again, thank you for choosing [facility].  Our hope is that these requests will decrease the amount of time that you wait before being seen by our physicians.       _____________________________________________________________  Should you have questions after your visit to [facility], please contact our office at [phone] between the hours of 8:00 a.m. and 4:30 p.m.  Voicemails left after 4:00 p.m. will not be returned until the following business day.  For prescription refill requests, have your pharmacy contact our office and allow 72 hours.    Cancer Center Support Programs:   > Cancer Support Group  2nd Tuesday of the month 1pm-2pm, Journey Room

## 2018-12-10 NOTE — Progress Notes (Signed)
Patient for procrit 30,000 units verbal order Dr. Delton Coombes.  Patient will be starting weekly injections per Dr. Delton Coombes.  Pharmacy notified.   Patient tolerated injection with no complaints voiced.  Site clean and dry with no bruising or swelling noted at site.  Band aid applied.  Vss with discharge and left ambulatory with no s/s of distress noted.

## 2018-12-10 NOTE — Assessment & Plan Note (Signed)
1.  MGUS: - Work-up for CKD on 08/06/2018 showed 6.9% M spike on 24-hour urine. - Skeletal survey on 10/12/2018 did not show any lytic lesions. -Myeloma labs showed M spike 0.8 g of IgG kappa protein.  Kappa light chains are elevated at 1451.  Lambda light chains are 22 and ratio is 63.96. -Bone marrow biopsy on 11/26/2018 shows hypercellular marrow for age with 12% plasma cells.  Background shows trilineage hematopoiesis with relative maintenance of erythroid precursors.  Significant dyspoiesis or increase in blastic cells is not identified. - Multiple myeloma FISH panel showed gain in chromosome 1 q. (poor risk indicator) and 13 q. deletion/monosomy 13 (standard risk indicator). -I have recommended doing a PET CT scan to look into any lytic lesions.  I will see him back after the scan.    2.  Macrocytic anemia: -This is from combination of CKD, possibly early MDS and plasma cell disorder.   -As he was requiring intermittent blood transfusions, Procrit 20,000 units every 2 weeks was started on 10/16/2018.  It was increased to 30,000 units every week on 11/14/2018. -Prior nutritional work-up including P21, folic acid and iron panel were normal. - Bone marrow biopsy did not show any significant dysplasia. - Today his hemoglobin is improved to 9.1.  We will continue Procrit 30,000 units every week. - I will reevaluate him in few weeks.

## 2018-12-10 NOTE — Progress Notes (Signed)
Randy Fitzpatrick, [place] [address]   CLINIC:  Medical Oncology/Hematology  PCP:  [name], [place] [address] [phone]   REASON FOR VISIT:  Follow-up for MGUS    INTERVAL HISTORY:  Randy Fitzpatrick 85 y.o. male is seen for follow-up of his anemia and MGUS.  Randy Fitzpatrick underwent bone marrow biopsy couple of weeks ago.  Reports that Randy Fitzpatrick does not have much of energy.  Appetite is 75%.  Randy Fitzpatrick feels that his energy levels improved towards the middle and end of the day.  Denies any bleeding per rectum or melena.  Randy Fitzpatrick is tolerating Procrit very well.  Denies any ER visits or hospitalizations.  We switched his Procrit to weekly doses and Randy Fitzpatrick is tolerating it very well.   REVIEW OF SYSTEMS:  Review of Systems  Constitutional: Positive for fatigue.  Respiratory: Positive for shortness of breath.   All other systems reviewed and are negative.    PAST MEDICAL/SURGICAL HISTORY:  Past Medical History:  Diagnosis Date  . Acute ST elevation myocardial infarction (STEMI) of inferior wall (HCC) 11/03/2017   PCI/DESx3 to RCA after wire dissection. EF 45-50%  . BPH (benign prostatic hyperplasia)   . CAD (coronary artery disease)    Multivessel/left main disease  . Hypothyroidism   . Mitral regurgitation   . Mixed hyperlipidemia   . Type 2 diabetes mellitus ([place])    Past Surgical History:  Procedure Laterality Date  . CORONARY STENT INTERVENTION N/A 11/03/2017   Procedure: CORONARY STENT INTERVENTION;  Surgeon: Lorretta Harp, MD;  Location: [facility];  Service: Cardiovascular;  Laterality: N/A;  . CORONARY/GRAFT ACUTE MI REVASCULARIZATION N/A 11/03/2017   Procedure: Coronary/Graft Acute MI Revascularization;  Surgeon: Lorretta Harp, MD;  Location: [facility];  Service: Cardiovascular;  Laterality: N/A;  . LEFT HEART CATH AND CORONARY ANGIOGRAPHY N/A 11/03/2017   Procedure: LEFT HEART CATH AND CORONARY ANGIOGRAPHY;   Surgeon: Lorretta Harp, MD;  Location: [facility];  Service: Cardiovascular;  Laterality: N/A;  . STOMACH SURGERY     Treatment of an abscess     SOCIAL HISTORY:  Social History   Socioeconomic History  . Marital status: Married    Spouse name: Not on file  . Number of children: 1  . Years of education: Not on file  . Highest education level: Not on file  Occupational History  . Not on file  Social Needs  . Financial resource strain: Not hard at all  . Food insecurity    Worry: Never true    Inability: Never true  . Transportation needs    Medical: No    Non-medical: No  Tobacco Use  . Smoking status: Former Smoker    Packs/day: 1.00    Years: 40.00    Pack years: 40.00    Types: Cigarettes    Quit date: 06/26/1982    Years since quitting: 36.4  . Smokeless tobacco: Never Used  Substance and Sexual Activity  . Alcohol use: No  . Drug use: No  . Sexual activity: Not on file  Lifestyle  . Physical activity    Days per week: 0 days    Minutes per session: 0 min  . Stress: Not at all  Relationships  . Social connections    Talks on phone: Once a week    Gets together: Once a week    Attends religious service: More than 4 times per  year    Active member of club or organization: Yes    Attends meetings of clubs or organizations: More than 4 times per year    Relationship status: Married  . Intimate partner violence    Fear of current or ex partner: No    Emotionally abused: No    Physically abused: No    Forced sexual activity: No  Other Topics Concern  . Not on file  Social History Narrative  . Not on file    FAMILY HISTORY:  Family History  Problem Relation Age of Onset  . Heart failure Father 78  . Parkinsonism Mother 23  . Lung cancer Brother     CURRENT MEDICATIONS:  Outpatient Encounter Medications as of 12/10/2018  Medication Sig  . aspirin EC 81 MG EC tablet Take 1 tablet (81 mg total) by mouth daily.  Marland Kitchen atorvastatin (LIPITOR) 80 MG  tablet Take 1 tablet (80 mg total) by mouth daily.  . carvedilol (COREG) 3.125 MG tablet Take 1 tablet (3.125 mg total) by mouth 2 (two) times daily with a meal.  . clopidogrel (PLAVIX) 75 MG tablet Take 1 tablet (75 mg total) by mouth daily.  . finasteride (PROSCAR) 5 MG tablet Take 5 mg by mouth daily.  . furosemide (LASIX) 20 MG tablet Take 20 mg by mouth daily as needed.  Marland Kitchen levothyroxine (SYNTHROID, LEVOTHROID) 50 MCG tablet Take 50 mcg by mouth daily.   . nitroGLYCERIN (NITROSTAT) 0.4 MG SL tablet Place 1 tablet (0.4 mg total) under the tongue every 5 (five) minutes x 3 doses as needed for chest pain.  . pantoprazole (PROTONIX) 40 MG tablet Take 40 mg by mouth 2 (two) times daily.  . tamsulosin (FLOMAX) 0.4 MG CAPS capsule Take 0.4 mg by mouth daily.    No facility-administered encounter medications on file as of 12/10/2018.     ALLERGIES:  No Known Allergies   PHYSICAL EXAM:  ECOG Performance status: 1  Vitals:   12/10/18 1035  BP: (!) 151/58  Pulse: 73  Resp: 16  Temp: 97.6 F (36.4 C)  SpO2: 100%   Filed Weights   12/10/18 1035  Weight: 156 lb (70.8 kg)    Physical Exam Vitals signs reviewed.  Constitutional:      Appearance: Normal appearance.  Cardiovascular:     Rate and Rhythm: Normal rate and regular rhythm.  Pulmonary:     Effort: Pulmonary effort is normal.     Breath sounds: Normal breath sounds.  Abdominal:     General: There is no distension.     Palpations: Abdomen is soft. There is no mass.  Musculoskeletal:     Right lower leg: Edema present.     Left lower leg: Edema present.  Skin:    General: Skin is warm.  Neurological:     Mental Status: Randy Fitzpatrick is alert and oriented to person, place, and time.  Psychiatric:        Mood and Affect: Mood normal.        Behavior: Behavior normal.      LABORATORY DATA:  I have reviewed the labs as listed.  CBC    Component Value Date/Time   WBC 4.7 12/10/2018 1057   RBC 2.88 (L) 12/10/2018 1057   HGB  9.1 (L) 12/10/2018 1057   HCT 30.5 (L) 12/10/2018 1057   HCT 25.3 (L) 04/01/2018 1446   PLT 234 12/10/2018 1057   MCV 105.9 (H) 12/10/2018 1057   MCH 31.6 12/10/2018 1057   MCHC 29.8 (  L) 12/10/2018 1057   RDW 15.5 12/10/2018 1057   LYMPHSABS 0.7 12/10/2018 1057   MONOABS 0.5 12/10/2018 1057   EOSABS 0.2 12/10/2018 1057   BASOSABS 0.0 12/10/2018 1057   CMP Latest Ref Rng & Units 09/30/2018 04/05/2018 04/04/2018  Glucose 70 - 99 mg/dL 102(H) 115(H) 123(H)  BUN 8 - 23 mg/dL 51(H) 64(H) 63(H)  Creatinine 0.61 - 1.24 mg/dL 3.05(H) 2.28(H) 2.36(H)  Sodium 135 - 145 mmol/L 139 140 139  Potassium 3.5 - 5.1 mmol/L 5.1 4.5 4.5  Chloride 98 - 111 mmol/L 111 105 108  CO2 22 - 32 mmol/L 20(L) 24 24  Calcium 8.9 - 10.3 mg/dL 8.6(L) 8.7(L) 8.4(L)  Total Protein 6.5 - 8.1 g/dL 7.3 - -  Total Bilirubin 0.3 - 1.2 mg/dL 0.4 - -  Alkaline Phos 38 - 126 U/L 68 - -  AST 15 - 41 U/L 21 - -  ALT 0 - 44 U/L 24 - -       DIAGNOSTIC IMAGING:  I have independently reviewed the scans and discussed with the patient.   I have reviewed Venita Lick LPN's note and agree with the documentation.  I personally performed a face-to-face visit, made revisions and my assessment and plan is as follows.    ASSESSMENT & PLAN:   Normocytic anemia 1.  MGUS: - Work-up for CKD on 08/06/2018 showed 6.9% M spike on 24-hour urine. - Skeletal survey on 10/12/2018 did not show any lytic lesions. -Myeloma labs showed M spike 0.8 g of IgG kappa protein.  Kappa light chains are elevated at 1451.  Lambda light chains are 22 and ratio is 63.96. -Bone marrow biopsy on 11/26/2018 shows hypercellular marrow for age with 12% plasma cells.  Background shows trilineage hematopoiesis with relative maintenance of erythroid precursors.  Significant dyspoiesis or increase in blastic cells is not identified. - Multiple myeloma FISH panel showed gain in chromosome 1 q. (poor risk indicator) and 13 q. deletion/monosomy 13 (standard risk  indicator). -I have recommended doing a PET CT scan to look into any lytic lesions.  I will see him back after the scan.    2.  Macrocytic anemia: -This is from combination of CKD, possibly early MDS and plasma cell disorder.   -As Randy Fitzpatrick was requiring intermittent blood transfusions, Procrit 20,000 units every 2 weeks was started on 10/16/2018.  It was increased to 30,000 units every week on 11/14/2018. -Prior nutritional work-up including Y85, folic acid and iron panel were normal. - Bone marrow biopsy did not show any significant dysplasia. - Today his hemoglobin is improved to 9.1.  We will continue Procrit 30,000 units every week. - I will reevaluate him in few weeks.   Total time spent is 25 minutes with more than 50% of the time spent face-to-face discussing new findings, treatment changes, bone marrow biopsy and coordination of care.    Orders placed this encounter:  Orders Placed This Encounter  Procedures  . NM PET Image Initial (PI) Skull Base To Thigh  . CBC with Differential      Derek Jack, MD [place] [phone]

## 2018-12-16 ENCOUNTER — Encounter (HOSPITAL_COMMUNITY): Payer: Medicare Other

## 2018-12-17 ENCOUNTER — Ambulatory Visit (HOSPITAL_COMMUNITY): Payer: Medicare Other | Admitting: Hematology

## 2018-12-17 ENCOUNTER — Ambulatory Visit (HOSPITAL_COMMUNITY): Payer: Medicare Other

## 2018-12-17 ENCOUNTER — Other Ambulatory Visit (HOSPITAL_COMMUNITY): Payer: Medicare Other

## 2018-12-18 ENCOUNTER — Encounter: Payer: Self-pay | Admitting: Student

## 2018-12-18 ENCOUNTER — Other Ambulatory Visit (HOSPITAL_COMMUNITY): Payer: Self-pay | Admitting: *Deleted

## 2018-12-18 ENCOUNTER — Ambulatory Visit (INDEPENDENT_AMBULATORY_CARE_PROVIDER_SITE_OTHER): Payer: Medicare Other | Admitting: Student

## 2018-12-18 ENCOUNTER — Encounter: Payer: Self-pay | Admitting: *Deleted

## 2018-12-18 ENCOUNTER — Other Ambulatory Visit: Payer: Self-pay

## 2018-12-18 VITALS — BP 148/65 | HR 76 | Ht 70.0 in | Wt 151.0 lb

## 2018-12-18 DIAGNOSIS — I10 Essential (primary) hypertension: Secondary | ICD-10-CM | POA: Diagnosis not present

## 2018-12-18 DIAGNOSIS — D649 Anemia, unspecified: Secondary | ICD-10-CM

## 2018-12-18 DIAGNOSIS — E785 Hyperlipidemia, unspecified: Secondary | ICD-10-CM

## 2018-12-18 DIAGNOSIS — D472 Monoclonal gammopathy: Secondary | ICD-10-CM

## 2018-12-18 DIAGNOSIS — I255 Ischemic cardiomyopathy: Secondary | ICD-10-CM | POA: Diagnosis not present

## 2018-12-18 DIAGNOSIS — I5022 Chronic systolic (congestive) heart failure: Secondary | ICD-10-CM

## 2018-12-18 DIAGNOSIS — N184 Chronic kidney disease, stage 4 (severe): Secondary | ICD-10-CM

## 2018-12-18 DIAGNOSIS — I2510 Atherosclerotic heart disease of native coronary artery without angina pectoris: Secondary | ICD-10-CM

## 2018-12-18 DIAGNOSIS — N189 Chronic kidney disease, unspecified: Secondary | ICD-10-CM

## 2018-12-18 DIAGNOSIS — C9000 Multiple myeloma not having achieved remission: Secondary | ICD-10-CM

## 2018-12-18 MED ORDER — FUROSEMIDE 20 MG PO TABS: 20.0000 mg | ORAL_TABLET | ORAL | 3 refills | Status: DC

## 2018-12-18 NOTE — Patient Instructions (Signed)
Medication Instructions:  Your physician has recommended you make the following change in your medication: Start Lasix 20 mg Every Other Day   If you need a refill on your cardiac medications before your next appointment, please call your pharmacy.   Lab work: Your physician recommends that you return for lab work in: Tomorrow Paramedic)   If you have labs (blood work) drawn today and your tests are completely normal, you will receive your results only by: Marland Kitchen MyChart Message (if you have MyChart) OR . A paper copy in the mail If you have any lab test that is abnormal or we need to change your treatment, we will call you to review the results.  Testing/Procedures: NONE   Follow-Up: At [facility], you and your health needs are our priority.  As part of our continuing mission to provide you with exceptional heart care, we have created designated Provider Care Teams.  These Care Teams include your primary Cardiologist (physician) and Advanced Practice Providers (APPs -  Physician Assistants and Nurse Practitioners) who all work together to provide you with the care you need, when you need it. You will need a follow up appointment in 1 months.  Please call our office 2 months in advance to schedule this appointment.  You may see [name], MD or one of the following Advanced Practice Providers on your designated Care Team:   Bernerd Pho, PA-C [facility]) . Ermalinda Barrios, PA-C ([place])  Any Other Special Instructions Will Be Listed Below (If Applicable). Thank you for choosing [facility]!

## 2018-12-18 NOTE — Progress Notes (Signed)
Cardiology Office Note    Date:  12/18/2018   ID:  Randy Fitzpatrick., DOB October 23, 1924, MRN [id number]  PCP:  [name], [name], MD  Cardiologist: [name], MD    Chief Complaint  Patient presents with   Hospitalization Follow-up    History of Present Illness:    Randy Fitzpatrick. is a 85 y.o. male with past medical history of CAD (s/p DES x3 to RCA in 10/2017), HTN, HLD, MGUS, and Stage 4 CKD who presents to the office today for hospital follow-up.  He most recently had a telehealth visit with Dr. Domenic Polite on 11/28/2018 and reported overall doing well at that time and was taking Lasix as needed.  He was continued on DAPT given his stent burden and that he was overall tolerating this well.   In the interim, he was admitted to [facility] on 12/16/2018 for evaluation of worsening dyspnea. By review of notes, his creatinine was elevated to 4.30 at the time of admission. Troponin value was 0.04 with proBNP at 39389.  Repeat echocardiogram showed similar results with an EF of 45 to 50% and known wall motion abnormalities. The discharge summary did not include any of his discharge medications or what his creatinine was at the time of discharge.   In talking with the patient today, he reports his breathing has improved but he is still having some orthopnea at night. Reports sleeping with 2 pillows at baseline but feels like he has needed to sleep in the recliner for the past week. He had not taken Lasix for over 10 days prior to his recent admission. Says he felt better when he was taking this every other day. He does report some lower extremity edema. Says that weight has overall been stable on his home scales and has actually declined by over 20 pounds within the past year. He tries to consume a low-sodium diet but does consumes some frozen food as he lives alone. His wife is in a memory care unit in [place] and he is disappointed that he is not been able to see her for the past few  months due to [place].  He denies any recent chest pain, palpitations, dizziness, or presyncope.  He is followed by hematology for his MGUS and anemia. Receives Procrit injections several times per month.   Past Medical History:  Diagnosis Date   Acute ST elevation myocardial infarction (STEMI) of inferior wall ([place]) 11/03/2017   PCI/DESx3 to RCA after wire dissection. EF 45-50%   BPH (benign prostatic hyperplasia)    CAD (coronary artery disease)    Multivessel/left main disease   Hypothyroidism    Mitral regurgitation    Mixed hyperlipidemia    Type 2 diabetes mellitus ([place])     Past Surgical History:  Procedure Laterality Date   CORONARY STENT INTERVENTION N/A 11/03/2017   Procedure: CORONARY STENT INTERVENTION;  Surgeon: Lorretta Harp, MD;  Location: [facility];  Service: Cardiovascular;  Laterality: N/A;   CORONARY/GRAFT ACUTE MI REVASCULARIZATION N/A 11/03/2017   Procedure: Coronary/Graft Acute MI Revascularization;  Surgeon: Lorretta Harp, MD;  Location: [facility];  Service: Cardiovascular;  Laterality: N/A;   LEFT HEART CATH AND CORONARY ANGIOGRAPHY N/A 11/03/2017   Procedure: LEFT HEART CATH AND CORONARY ANGIOGRAPHY;  Surgeon: Lorretta Harp, MD;  Location: [facility];  Service: Cardiovascular;  Laterality: N/A;   STOMACH SURGERY     Treatment of an abscess    Current Medications:  Outpatient Medications Prior to Visit  Medication Sig Dispense Refill   aspirin EC 81 MG EC tablet Take 1 tablet (81 mg total) by mouth daily.     atorvastatin (LIPITOR) 80 MG tablet Take 1 tablet (80 mg total) by mouth daily. 30 tablet 6   carvedilol (COREG) 3.125 MG tablet Take 1 tablet (3.125 mg total) by mouth 2 (two) times daily with a meal. 60 tablet 6   clopidogrel (PLAVIX) 75 MG tablet Take 1 tablet (75 mg total) by mouth daily. 90 tablet 0   finasteride (PROSCAR) 5 MG tablet Take 5 mg by mouth daily.     levothyroxine (SYNTHROID,  LEVOTHROID) 50 MCG tablet Take 50 mcg by mouth daily.      nitroGLYCERIN (NITROSTAT) 0.4 MG SL tablet Place 1 tablet (0.4 mg total) under the tongue every 5 (five) minutes x 3 doses as needed for chest pain. 25 tablet 1   pantoprazole (PROTONIX) 40 MG tablet Take 40 mg by mouth 2 (two) times daily.     tamsulosin (FLOMAX) 0.4 MG CAPS capsule Take 0.4 mg by mouth daily.      furosemide (LASIX) 20 MG tablet Take 20 mg by mouth daily as needed.     No facility-administered medications prior to visit.      Allergies:   Patient has no known allergies.   Social History   Socioeconomic History   Marital status: Married    Spouse name: Not on file   Number of children: 1   Years of education: Not on file   Highest education level: Not on file  Occupational History   Not on file  Social Needs   Financial resource strain: Not hard at all   Food insecurity    Worry: Never true    Inability: Never true   Transportation needs    Medical: No    Non-medical: No  Tobacco Use   Smoking status: Former Smoker    Packs/day: 1.00    Years: 40.00    Pack years: 40.00    Types: Cigarettes    Quit date: 06/26/1982    Years since quitting: 36.5   Smokeless tobacco: Never Used  Substance and Sexual Activity   Alcohol use: No   Drug use: No   Sexual activity: Not on file  Lifestyle   Physical activity    Days per week: 0 days    Minutes per session: 0 min   Stress: Not at all  Relationships   Social connections    Talks on phone: Once a week    Gets together: Once a week    Attends religious service: More than 4 times per year    Active member of club or organization: Yes    Attends meetings of clubs or organizations: More than 4 times per year    Relationship status: Married  Other Topics Concern   Not on file  Social History Narrative   Not on file     Family History:  The patient's family history includes Heart failure (age of onset: 78) in his father; Lung  cancer in his brother; Parkinsonism (age of onset: 42) in his mother.   Review of Systems:   Please see the history of present illness.     General:  No chills, fever, night sweats or weight changes.  Cardiovascular:  No chest pain, dyspnea on exertion, palpitations, paroxysmal nocturnal dyspnea. Positive for orthopnea and edema.  Dermatological: No rash, lesions/masses Respiratory: No cough, dyspnea Urologic: No hematuria, dysuria  Abdominal:   No nausea, vomiting, diarrhea, bright red blood per rectum, melena, or hematemesis Neurologic:  No visual changes, wkns, changes in mental status. All other systems reviewed and are otherwise negative except as noted above.   Physical Exam:    VS:  BP (!) 148/65    Pulse 76    Ht 5\' 10"  (1.778 m)    Wt 151 lb (68.5 kg)    BMI 21.67 kg/m    General: Well developed, elderly Caucasian male appearing in no acute distress. Head: Normocephalic, atraumatic, sclera non-icteric, no xanthomas, nares are without discharge.  Neck: No carotid bruits. JVD not elevated.  Lungs: Respirations regular and unlabored, without wheezes or rales.  Heart: Regular rate and rhythm. No S3 or S4.  No murmur, no rubs, or gallops appreciated. Abdomen: Soft, non-tender, non-distended with normoactive bowel sounds. No hepatomegaly. No rebound/guarding. No obvious abdominal masses. Msk:  Strength and tone appear normal for age. No joint deformities or effusions. Extremities: No clubbing or cyanosis. 1+ pitting edema bilaterally.  Distal pedal pulses are 2+ bilaterally. Neuro: Alert and oriented X 3. Moves all extremities spontaneously. No focal deficits noted. Psych:  Responds to questions appropriately with a normal affect. Skin: No rashes or lesions noted  Wt Readings from Last 3 Encounters:  12/18/18 151 lb (68.5 kg)  12/10/18 156 lb (70.8 kg)  11/28/18 155 lb 12.8 oz (70.7 kg)     Studies/Labs Reviewed:   EKG:  EKG is not ordered today.   Recent Labs: 03/31/2018:  B Natriuretic Peptide 650.5 09/30/2018: ALT 24; BUN 51; Creatinine, Ser 3.05; Potassium 5.1; Sodium 139 12/10/2018: Hemoglobin 9.1; Platelets 234   Lipid Panel    Component Value Date/Time   CHOL 224 (H) 11/04/2017 0242   TRIG 263 (H) 11/04/2017 0242   HDL 32 (L) 11/04/2017 0242   CHOLHDL 7.0 11/04/2017 0242   VLDL 53 (H) 11/04/2017 0242   LDLCALC 139 (H) 11/04/2017 0242    Additional studies/ records that were reviewed today include:   Echocardiogram: 03/2018 Study Conclusions  - Left ventricle: The cavity size was normal. Wall thickness was   increased in a pattern of mild LVH. Akinesis of the inferior   wall. Inferolateral hypokinesis. The estimated ejection fraction   was 40%. Features are consistent with a pseudonormal left   ventricular filling pattern, with concomitant abnormal relaxation   and increased filling pressure (grade 2 diastolic dysfunction). - Aortic valve: There was trivial regurgitation. - Aorta: Mildly dilated ascending aorta, 3.7 cm. - Mitral valve: Moderately calcified annulus. Mildly calcified   leaflets . There was no evidence for stenosis. There was moderate   regurgitation, suspect infarct-related with   inferior/inferolateral wall motion abnormalities and restriction   of posterior leaflet. Mean gradient (D): 5 mm Hg. Valve area by   pressure half-time: 2.65 cm^2. - Left atrium: The atrium was mildly dilated. - Right ventricle: The cavity size was normal. Systolic function   was normal. - Right atrium: The atrium was mildly dilated. - Tricuspid valve: Peak RV-RA gradient (S): 44 mm Hg. - Pulmonary arteries: PA peak pressure: 52 mm Hg (S). - Systemic veins: IVC measured 2.1 cm with > 50% respirophasic   variation, suggesting RA pressure 8 mmHg. - Pericardium, extracardiac: Cystic structures noted in liver. A   trivial pericardial effusion was identified.  Impressions:  - Normal LV size with mild LV hypertrophy, EF 40%. Inferior   akinesis,  inferolateral hypokinesis. Moderate diastolic   dysfunction. Normal RV size and systolic function.  Moderate   mitral regurgitation, suspect infarct-related. Moderate pulmonary   hypertension.   Echocardiogram: 07/2018 Left ventricular hypertrophy - mild  Mildly decreased left ventricular systolic function, ejection fraction  45 to 50%  Segmental wall motion abnormality - (inferior / posterior)  Diastolic dysfunction - grade I (normal filling pressures)  Aortic sclerosis  Mitral regurgitation - mild  Mitral annular calcification  Normal right ventricular systolic function  Assessment:    1. Coronary artery disease involving native coronary artery of native heart without angina pectoris   2. Ischemic cardiomyopathy   3. Essential hypertension   4. Hyperlipidemia LDL goal <70   5. CKD (chronic kidney disease) stage 4, GFR 15-29 ml/min (HCC)      Plan:   In order of problems listed above:  1. CAD - he is s/p DES x3 to RCA in 10/2017.  He denies any recent chest pain or dyspnea on exertion. Recent echocardiogram showed his EF had mildly improved to 45 to 50%. - Continue DAPT with ASA and Plavix as Dr. Domenic Polite previously recommended to continue if tolerating well and Hgb remains stable given his stent burden. Remains on BB and statin therapy.   2. Ischemic Cardiomyopathy/ Chronic Systolic CHF - EF previously 40% by echocardiogram in 03/2018, improved to 45-50% by repeat imaging at [facility].  - He has been experiencing worsening orthopnea and lower extremity edema since having stopped Lasix. Will plan to resume at his previous dosing of Lasix 20 mg every other day. I encouraged him to follow-up with Dr. Lowanda Foster as well given his Stage 4 CKD (says he was never told to previously stop Lasix but self-discontinued).  - continue Coreg. Not on ACE-I/ARB/ARNI due to CKD.   3. HTN - BP is at 148/65 during today's visit. He reports this has overall been well controlled at home. I  encouraged him to continue to follow his readings. Continue Coreg 3.125 mg twice daily.  4. HLD - Followed by PCP. He remains on Atorvastatin 80 mg daily. Goal LDL is less than 70 in the setting of known CAD.  5. Stage 4 CKD - creatinine 3.12 when checked last month, elevated to 4.30 during recent admission at [facility]. Unfortunately, a discharge summary was not sent over and it is unclear what his creatinine was at the time of discharge. He is scheduled for repeat labs with the cancer center tomorrow and will add on a BMET at that time. He has repeat labs next week as well and will again add a BMET given the use of Lasix. He has not been followed by Dr. Lowanda Foster recently as he thought his Hematologist was following his renal function. I reviewed the role of both and encouraged him to make a follow-up with his Nephrologist.    Medication Adjustments/Labs and Tests Ordered: Current medicines are reviewed at length with the patient today.  Concerns regarding medicines are outlined above.  Medication changes, Labs and Tests ordered today are listed in the Patient Instructions below. Patient Instructions  Medication Instructions:  Your physician has recommended you make the following change in your medication: Start Lasix 20 mg Every Other Day   If you need a refill on your cardiac medications before your next appointment, please call your pharmacy.   Lab work: Your physician recommends that you return for lab work in: Research scientist (life sciences))   If you have labs (blood work) drawn today and your tests are completely normal, you will receive your results only by:  [place] (if you have  MyChart) OR  A paper copy in the mail If you have any lab test that is abnormal or we need to change your treatment, we will call you to review the results.  Testing/Procedures: NONE   Follow-Up: At [facility], you and your health needs are our priority.  As part of our continuing mission to provide you with  exceptional heart care, we have created designated Provider Care Teams.  These Care Teams include your primary Cardiologist (physician) and Advanced Practice Providers (APPs -  Physician Assistants and Nurse Practitioners) who all work together to provide you with the care you need, when you need it. You will need a follow up appointment in 1 months.  Please call our office 2 months in advance to schedule this appointment.  You may see [name], MD or one of the following Advanced Practice Providers on your designated Care Team:   Bernerd Pho, PA-C ([place])  Ermalinda Barrios, PA-C [facility])  Any Other Special Instructions Will Be Listed Below (If Applicable). Thank you for choosing [facility]!     Signed, Erma Heritage, PA-C  12/18/2018 4:20 PM    [facility] [address] [place], [place] [address] Phone: [phone] Fax: [phone]

## 2018-12-19 ENCOUNTER — Inpatient Hospital Stay (HOSPITAL_COMMUNITY): Payer: Medicare Other

## 2018-12-19 ENCOUNTER — Ambulatory Visit (HOSPITAL_COMMUNITY): Payer: Medicare Other

## 2018-12-19 ENCOUNTER — Other Ambulatory Visit: Payer: Self-pay

## 2018-12-19 ENCOUNTER — Other Ambulatory Visit (HOSPITAL_COMMUNITY): Payer: Medicare Other

## 2018-12-19 ENCOUNTER — Inpatient Hospital Stay (HOSPITAL_BASED_OUTPATIENT_CLINIC_OR_DEPARTMENT_OTHER): Payer: Medicare Other | Admitting: Hematology

## 2018-12-19 ENCOUNTER — Encounter (HOSPITAL_COMMUNITY): Payer: Self-pay | Admitting: Hematology

## 2018-12-19 VITALS — BP 149/53 | HR 73 | Temp 97.5°F | Resp 18 | Wt 150.0 lb

## 2018-12-19 DIAGNOSIS — N189 Chronic kidney disease, unspecified: Secondary | ICD-10-CM

## 2018-12-19 DIAGNOSIS — E119 Type 2 diabetes mellitus without complications: Secondary | ICD-10-CM

## 2018-12-19 DIAGNOSIS — D631 Anemia in chronic kidney disease: Secondary | ICD-10-CM | POA: Diagnosis not present

## 2018-12-19 DIAGNOSIS — D649 Anemia, unspecified: Secondary | ICD-10-CM

## 2018-12-19 DIAGNOSIS — D472 Monoclonal gammopathy: Secondary | ICD-10-CM

## 2018-12-19 DIAGNOSIS — C9000 Multiple myeloma not having achieved remission: Secondary | ICD-10-CM

## 2018-12-19 DIAGNOSIS — N183 Chronic kidney disease, stage 3 (moderate): Secondary | ICD-10-CM | POA: Diagnosis not present

## 2018-12-19 DIAGNOSIS — E782 Mixed hyperlipidemia: Secondary | ICD-10-CM

## 2018-12-19 DIAGNOSIS — Z87891 Personal history of nicotine dependence: Secondary | ICD-10-CM

## 2018-12-19 DIAGNOSIS — E039 Hypothyroidism, unspecified: Secondary | ICD-10-CM

## 2018-12-19 LAB — CBC
HCT: 31.2 % — ABNORMAL LOW (ref 39.0–52.0)
Hemoglobin: 9.1 g/dL — ABNORMAL LOW (ref 13.0–17.0)
MCH: 31 pg (ref 26.0–34.0)
MCHC: 29.2 g/dL — ABNORMAL LOW (ref 30.0–36.0)
MCV: 106.1 fL — ABNORMAL HIGH (ref 80.0–100.0)
Platelets: 204 10*3/uL (ref 150–400)
RBC: 2.94 MIL/uL — ABNORMAL LOW (ref 4.22–5.81)
RDW: 15.2 % (ref 11.5–15.5)
WBC: 5.3 10*3/uL (ref 4.0–10.5)
nRBC: 0 % (ref 0.0–0.2)

## 2018-12-19 MED ORDER — EPOETIN ALFA 10000 UNIT/ML IJ SOLN
10000.0000 [IU] | Freq: Once | INTRAMUSCULAR | Status: AC
  Administered 2018-12-19: 10000 [IU] via SUBCUTANEOUS
  Filled 2018-12-19: qty 1

## 2018-12-19 MED ORDER — EPOETIN ALFA 20000 UNIT/ML IJ SOLN
20000.0000 [IU] | Freq: Once | INTRAMUSCULAR | Status: AC
  Administered 2018-12-19: 20000 [IU] via SUBCUTANEOUS
  Filled 2018-12-19: qty 1

## 2018-12-19 NOTE — Patient Instructions (Signed)
[facility] _______________________________________________________________  Thank you for choosing [facility] at [facility] to provide your oncology and hematology care.  To afford each patient quality time with our providers, please arrive at least 15 minutes before your scheduled appointment.  You need to re-schedule your appointment if you arrive 10 or more minutes late.  We strive to give you quality time with our providers, and arriving late affects you and other patients whose appointments are after yours.  Also, if you no show three or more times for appointments you may be dismissed from the clinic.  Again, thank you for choosing [facility] at [facility]. Our hope is that these requests will allow you access to exceptional care and in a timely manner. _______________________________________________________________  If you have questions after your visit, please contact our office at [phone] between the hours of 8:30 a.m. and 5:00 p.m. Voicemails left after 4:30 p.m. will not be returned until the following business day. _______________________________________________________________  For prescription refill requests, have your pharmacy contact our office. _______________________________________________________________  Recommendations made by the consultant and any test results will be sent to your referring physician. _______________________________________________________________ 

## 2018-12-19 NOTE — Progress Notes (Signed)
[address], [place] [address]   CLINIC:  Medical Oncology/Hematology  PCP:  [name], [place] [address] [phone]   REASON FOR VISIT:  Follow-up for MGUS    INTERVAL HISTORY:  Mr. Helzer 85 y.o. 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  Appetite is 75%.  Energy levels are low.  Shortness of breath on exertion present.  He was reportedly evaluated at [facility] and was diuresed for CHF.  He missed his appointment for PET scan as result on Monday.  Denies any bleeding per rectum or melena.  Denies any nausea, vomiting or diarrhea.  No fevers or infections reported.  REVIEW OF SYSTEMS:  Review of Systems  Respiratory: Positive for shortness of breath.   All other systems reviewed and are negative.    PAST MEDICAL/SURGICAL HISTORY:  Past Medical History:  Diagnosis Date  . Acute ST elevation myocardial infarction (STEMI) of inferior wall (HCC) 11/03/2017   PCI/DESx3 to RCA after wire dissection. EF 45-50%  . BPH (benign prostatic hyperplasia)   . CAD (coronary artery disease)    Multivessel/left main disease  . Hypothyroidism   . Mitral regurgitation   . Mixed hyperlipidemia   . Type 2 diabetes mellitus ([place])    Past Surgical History:  Procedure Laterality Date  . CORONARY STENT INTERVENTION N/A 11/03/2017   Procedure: CORONARY STENT INTERVENTION;  Surgeon: Lorretta Harp, MD;  Location: [facility];  Service: Cardiovascular;  Laterality: N/A;  . CORONARY/GRAFT ACUTE MI REVASCULARIZATION N/A 11/03/2017   Procedure: Coronary/Graft Acute MI Revascularization;  Surgeon: Lorretta Harp, MD;  Location: [facility];  Service: Cardiovascular;  Laterality: N/A;  . LEFT HEART CATH AND CORONARY ANGIOGRAPHY N/A 11/03/2017   Procedure: LEFT HEART CATH AND CORONARY ANGIOGRAPHY;  Surgeon: Lorretta Harp, MD;  Location: [facility];  Service: Cardiovascular;  Laterality: N/A;  .  STOMACH SURGERY     Treatment of an abscess     SOCIAL HISTORY:  Social History   Socioeconomic History  . Marital status: Married    Spouse name: Not on file  . Number of children: 1  . Years of education: Not on file  . Highest education level: Not on file  Occupational History  . Not on file  Social Needs  . Financial resource strain: Not hard at all  . Food insecurity    Worry: Never true    Inability: Never true  . Transportation needs    Medical: No    Non-medical: No  Tobacco Use  . Smoking status: Former Smoker    Packs/day: 1.00    Years: 40.00    Pack years: 40.00    Types: Cigarettes    Quit date: 06/26/1982    Years since quitting: 36.5  . Smokeless tobacco: Never Used  Substance and Sexual Activity  . Alcohol use: No  . Drug use: No  . Sexual activity: Not on file  Lifestyle  . Physical activity    Days per week: 0 days    Minutes per session: 0 min  . Stress: Not at all  Relationships  . Social Herbalist on phone: Once a week    Gets together: Once a week    Attends religious service: More than 4 times per year    Active member of club or organization: Yes    Attends meetings of clubs or organizations: More than  4 times per year    Relationship status: Married  . Intimate partner violence    Fear of current or ex partner: No    Emotionally abused: No    Physically abused: No    Forced sexual activity: No  Other Topics Concern  . Not on file  Social History Narrative  . Not on file    FAMILY HISTORY:  Family History  Problem Relation Age of Onset  . Heart failure Father 35  . Parkinsonism Mother 2  . Lung cancer Brother     CURRENT MEDICATIONS:  Outpatient Encounter Medications as of 12/19/2018  Medication Sig  . aspirin EC 81 MG EC tablet Take 1 tablet (81 mg total) by mouth daily.  Marland Kitchen atorvastatin (LIPITOR) 80 MG tablet Take 1 tablet (80 mg total) by mouth daily.  . carvedilol (COREG) 3.125 MG tablet Take 1 tablet (3.125  mg total) by mouth 2 (two) times daily with a meal.  . clopidogrel (PLAVIX) 75 MG tablet Take 1 tablet (75 mg total) by mouth daily.  . finasteride (PROSCAR) 5 MG tablet Take 5 mg by mouth daily.  . furosemide (LASIX) 20 MG tablet Take 1 tablet (20 mg total) by mouth every other day.  . levothyroxine (SYNTHROID, LEVOTHROID) 50 MCG tablet Take 50 mcg by mouth daily.   . nitroGLYCERIN (NITROSTAT) 0.4 MG SL tablet Place 1 tablet (0.4 mg total) under the tongue every 5 (five) minutes x 3 doses as needed for chest pain.  . pantoprazole (PROTONIX) 40 MG tablet Take 40 mg by mouth 2 (two) times daily.  . tamsulosin (FLOMAX) 0.4 MG CAPS capsule Take 0.4 mg by mouth daily.    No facility-administered encounter medications on file as of 12/19/2018.     ALLERGIES:  No Known Allergies   PHYSICAL EXAM:  ECOG Performance status: 1  Vitals:   12/19/18 0949  BP: (!) 149/53  Pulse: 73  Resp: 18  Temp: (!) 97.5 F (36.4 C)  SpO2: 99%   Filed Weights   12/19/18 0949  Weight: 150 lb (68 kg)    Physical Exam Vitals signs reviewed.  Constitutional:      Appearance: Normal appearance.  Cardiovascular:     Rate and Rhythm: Normal rate and regular rhythm.  Pulmonary:     Effort: Pulmonary effort is normal.     Breath sounds: Normal breath sounds.  Abdominal:     General: There is no distension.     Palpations: Abdomen is soft. There is no mass.  Musculoskeletal:     Right lower leg: Edema present.     Left lower leg: Edema present.  Skin:    General: Skin is warm.  Neurological:     Mental Status: He is alert and oriented to person, place, and time.  Psychiatric:        Mood and Affect: Mood normal.        Behavior: Behavior normal.      LABORATORY DATA:  I have reviewed the labs as listed.  CBC    Component Value Date/Time   WBC 5.3 12/19/2018 0918   RBC 2.94 (L) 12/19/2018 0918   HGB 9.1 (L) 12/19/2018 0918   HCT 31.2 (L) 12/19/2018 0918   HCT 25.3 (L) 04/01/2018 1446    PLT 204 12/19/2018 0918   MCV 106.1 (H) 12/19/2018 0918   MCH 31.0 12/19/2018 0918   MCHC 29.2 (L) 12/19/2018 0918   RDW 15.2 12/19/2018 0918   LYMPHSABS 0.7 12/10/2018 1057   MONOABS  0.5 12/10/2018 1057   EOSABS 0.2 12/10/2018 1057   BASOSABS 0.0 12/10/2018 1057   CMP Latest Ref Rng & Units 09/30/2018 04/05/2018 04/04/2018  Glucose 70 - 99 mg/dL 102(H) 115(H) 123(H)  BUN 8 - 23 mg/dL 51(H) 64(H) 63(H)  Creatinine 0.61 - 1.24 mg/dL 3.05(H) 2.28(H) 2.36(H)  Sodium 135 - 145 mmol/L 139 140 139  Potassium 3.5 - 5.1 mmol/L 5.1 4.5 4.5  Chloride 98 - 111 mmol/L 111 105 108  CO2 22 - 32 mmol/L 20(L) 24 24  Calcium 8.9 - 10.3 mg/dL 8.6(L) 8.7(L) 8.4(L)  Total Protein 6.5 - 8.1 g/dL 7.3 - -  Total Bilirubin 0.3 - 1.2 mg/dL 0.4 - -  Alkaline Phos 38 - 126 U/L 68 - -  AST 15 - 41 U/L 21 - -  ALT 0 - 44 U/L 24 - -       DIAGNOSTIC IMAGING:  I have independently reviewed the scans and discussed with the patient.   I have reviewed Venita Lick LPN's note and agree with the documentation.  I personally performed a face-to-face visit, made revisions and my assessment and plan is as follows.    ASSESSMENT & PLAN:   Normocytic anemia 1.  MGUS: - Work-up for CKD on 08/06/2018 showed 6.9% M spike on 24-hour urine. - Skeletal survey on 10/12/2018 did not show any lytic lesions. -Myeloma labs showed M spike 0.8 g of IgG kappa protein.  Kappa light chains are elevated at 1451.  Lambda light chains are 22 and ratio is 63.96. -Bone marrow biopsy on 11/26/2018 shows hypercellular marrow for age with 12% plasma cells.  Background shows trilineage hematopoiesis with relative maintenance of erythroid precursors.  Significant dyspoiesis or increase in blastic cells is not identified. - Multiple myeloma FISH panel showed gain in chromosome 1 q. (poor risk indicator) and 13 q. deletion/monosomy 13 (standard risk indicator). -We have ordered PET CT scan at last visit.  However he missed appointment this  Monday as he was admitted to [facility] with CHF. -We will reschedule his PET CT scan in July.    2.  Macrocytic anemia: -This is from combination of CKD, possibly early MDS and plasma cell disorder.   -As he was requiring intermittent blood transfusions, Procrit 20,000 units every 2 weeks was started on 10/16/2018.  It was increased to 30,000 units every week on 11/14/2018. -Prior nutritional work-up including H73, folic acid and iron panel were normal. - Bone marrow biopsy did not show any significant dysplasia. -Hemoglobin today is 9.1. -We will continue Procrit 30,000 units weekly.  He reportedly was admitted to [facility] with CHF for the last few days.  He is being aggressively diuresed at this time.  Denies any bleeding per rectum or melena. - As he is very feeble looking, we will reevaluate him in 1 week.   Total time spent is 25 minutes with more than 50% of the time spent face-to-face discussing treatment plan and counseling and coordination of care.  Orders placed this encounter:  Orders Placed This Encounter  Procedures  . CBC with Differential/Platelet  . Iron and TIBC  . Ferritin      Derek Jack, MD [place] [phone]

## 2018-12-19 NOTE — Progress Notes (Signed)
Randy Fitzpatrick. presents today for Procrit injection. Hemoglobin reviewed prior to administration. VSS. Injection tolerated without incident or complaint. See MAR for details. Patient discharged in satisfactory condition with follow up instructions.

## 2018-12-19 NOTE — Patient Instructions (Signed)
[facility] at [facility] Discharge Instructions  You were seen today by Dr. Katragadda. He went over your recent lab results. He will see you back in 1 week for labs and follow up.   Thank you for choosing [facility] at [facility] to provide your oncology and hematology care.  To afford each patient quality time with our provider, please arrive at least 15 minutes before your scheduled appointment time.   If you have a lab appointment with the Cancer Center please come in thru the  Main Entrance and check in at the main information desk  You need to re-schedule your appointment should you arrive 10 or more minutes late.  We strive to give you quality time with our providers, and arriving late affects you and other patients whose appointments are after yours.  Also, if you no show three or more times for appointments you may be dismissed from the clinic at the providers discretion.     Again, thank you for choosing [facility].  Our hope is that these requests will decrease the amount of time that you wait before being seen by our physicians.       _____________________________________________________________  Should you have questions after your visit to [facility], please contact our office at [phone] between the hours of 8:00 a.m. and 4:30 p.m.  Voicemails left after 4:00 p.m. will not be returned until the following business day.  For prescription refill requests, have your pharmacy contact our office and allow 72 hours.    Cancer Center Support Programs:   > Cancer Support Group  2nd Tuesday of the month 1pm-2pm, Journey Room    

## 2018-12-19 NOTE — Assessment & Plan Note (Signed)
1.  MGUS: - Work-up for CKD on 08/06/2018 showed 6.9% M spike on 24-hour urine. - Skeletal survey on 10/12/2018 did not show any lytic lesions. -Myeloma labs showed M spike 0.8 g of IgG kappa protein.  Kappa light chains are elevated at 1451.  Lambda light chains are 22 and ratio is 63.96. -Bone marrow biopsy on 11/26/2018 shows hypercellular marrow for age with 12% plasma cells.  Background shows trilineage hematopoiesis with relative maintenance of erythroid precursors.  Significant dyspoiesis or increase in blastic cells is not identified. - Multiple myeloma FISH panel showed gain in chromosome 1 q. (poor risk indicator) and 13 q. deletion/monosomy 13 (standard risk indicator). -We have ordered PET CT scan at last visit.  However he missed appointment this Monday as he was admitted to [facility] with CHF. -We will reschedule his PET CT scan in July.    2.  Macrocytic anemia: -This is from combination of CKD, possibly early MDS and plasma cell disorder.   -As he was requiring intermittent blood transfusions, Procrit 20,000 units every 2 weeks was started on 10/16/2018.  It was increased to 30,000 units every week on 11/14/2018. -Prior nutritional work-up including O71, folic acid and iron panel were normal. - Bone marrow biopsy did not show any significant dysplasia. -Hemoglobin today is 9.1. -We will continue Procrit 30,000 units weekly.  He reportedly was admitted to [facility] with CHF for the last few days.  He is being aggressively diuresed at this time.  Denies any bleeding per rectum or melena. - As he is very feeble looking, we will reevaluate him in 1 week.

## 2018-12-23 ENCOUNTER — Encounter (HOSPITAL_COMMUNITY): Payer: Self-pay | Admitting: Hematology

## 2018-12-24 ENCOUNTER — Other Ambulatory Visit (HOSPITAL_COMMUNITY): Payer: Medicare Other

## 2018-12-24 ENCOUNTER — Ambulatory Visit (HOSPITAL_COMMUNITY): Payer: Medicare Other

## 2018-12-26 ENCOUNTER — Inpatient Hospital Stay (HOSPITAL_COMMUNITY): Payer: Medicare Other | Attending: Hematology

## 2018-12-26 ENCOUNTER — Other Ambulatory Visit (HOSPITAL_COMMUNITY): Payer: Medicare Other

## 2018-12-26 ENCOUNTER — Inpatient Hospital Stay (HOSPITAL_BASED_OUTPATIENT_CLINIC_OR_DEPARTMENT_OTHER): Payer: Medicare Other | Admitting: Nurse Practitioner

## 2018-12-26 ENCOUNTER — Inpatient Hospital Stay (HOSPITAL_COMMUNITY): Payer: Medicare Other

## 2018-12-26 ENCOUNTER — Other Ambulatory Visit: Payer: Self-pay

## 2018-12-26 DIAGNOSIS — D649 Anemia, unspecified: Secondary | ICD-10-CM

## 2018-12-26 DIAGNOSIS — D472 Monoclonal gammopathy: Secondary | ICD-10-CM | POA: Insufficient documentation

## 2018-12-26 DIAGNOSIS — D509 Iron deficiency anemia, unspecified: Secondary | ICD-10-CM

## 2018-12-26 DIAGNOSIS — E039 Hypothyroidism, unspecified: Secondary | ICD-10-CM | POA: Insufficient documentation

## 2018-12-26 DIAGNOSIS — Z87891 Personal history of nicotine dependence: Secondary | ICD-10-CM | POA: Diagnosis not present

## 2018-12-26 DIAGNOSIS — C9000 Multiple myeloma not having achieved remission: Secondary | ICD-10-CM | POA: Insufficient documentation

## 2018-12-26 DIAGNOSIS — N189 Chronic kidney disease, unspecified: Secondary | ICD-10-CM | POA: Diagnosis not present

## 2018-12-26 DIAGNOSIS — D631 Anemia in chronic kidney disease: Secondary | ICD-10-CM

## 2018-12-26 DIAGNOSIS — E782 Mixed hyperlipidemia: Secondary | ICD-10-CM | POA: Insufficient documentation

## 2018-12-26 DIAGNOSIS — Z79899 Other long term (current) drug therapy: Secondary | ICD-10-CM | POA: Diagnosis not present

## 2018-12-26 DIAGNOSIS — E1122 Type 2 diabetes mellitus with diabetic chronic kidney disease: Secondary | ICD-10-CM

## 2018-12-26 DIAGNOSIS — I252 Old myocardial infarction: Secondary | ICD-10-CM | POA: Diagnosis not present

## 2018-12-26 DIAGNOSIS — E611 Iron deficiency: Secondary | ICD-10-CM | POA: Insufficient documentation

## 2018-12-26 DIAGNOSIS — N183 Chronic kidney disease, stage 3 (moderate): Secondary | ICD-10-CM | POA: Diagnosis present

## 2018-12-26 LAB — CBC WITH DIFFERENTIAL/PLATELET
Abs Immature Granulocytes: 0.02 10*3/uL (ref 0.00–0.07)
Basophils Absolute: 0 10*3/uL (ref 0.0–0.1)
Basophils Relative: 0 %
Eosinophils Absolute: 0.1 10*3/uL (ref 0.0–0.5)
Eosinophils Relative: 2 %
HCT: 30.2 % — ABNORMAL LOW (ref 39.0–52.0)
Hemoglobin: 8.8 g/dL — ABNORMAL LOW (ref 13.0–17.0)
Immature Granulocytes: 0 %
Lymphocytes Relative: 13 %
Lymphs Abs: 0.7 10*3/uL (ref 0.7–4.0)
MCH: 30 pg (ref 26.0–34.0)
MCHC: 29.1 g/dL — ABNORMAL LOW (ref 30.0–36.0)
MCV: 103.1 fL — ABNORMAL HIGH (ref 80.0–100.0)
Monocytes Absolute: 0.5 10*3/uL (ref 0.1–1.0)
Monocytes Relative: 10 %
Neutro Abs: 3.7 10*3/uL (ref 1.7–7.7)
Neutrophils Relative %: 75 %
Platelets: 248 10*3/uL (ref 150–400)
RBC: 2.93 MIL/uL — ABNORMAL LOW (ref 4.22–5.81)
RDW: 15.4 % (ref 11.5–15.5)
WBC: 5 10*3/uL (ref 4.0–10.5)
nRBC: 0 % (ref 0.0–0.2)

## 2018-12-26 LAB — IRON AND TIBC
Iron: 9 ug/dL — ABNORMAL LOW (ref 45–182)
Saturation Ratios: 5 % — ABNORMAL LOW (ref 17.9–39.5)
TIBC: 170 ug/dL — ABNORMAL LOW (ref 250–450)
UIBC: 161 ug/dL

## 2018-12-26 LAB — FERRITIN: Ferritin: 195 ng/mL (ref 24–336)

## 2018-12-26 MED ORDER — ACETAMINOPHEN 325 MG PO TABS
ORAL_TABLET | ORAL | Status: AC
  Filled 2018-12-26: qty 2

## 2018-12-26 MED ORDER — EPOETIN ALFA 20000 UNIT/ML IJ SOLN
20000.0000 [IU] | Freq: Once | INTRAMUSCULAR | Status: AC
  Administered 2018-12-26: 15:00:00 20000 [IU] via SUBCUTANEOUS
  Filled 2018-12-26: qty 1

## 2018-12-26 MED ORDER — ACETAMINOPHEN 325 MG PO TABS
650.0000 mg | ORAL_TABLET | Freq: Four times a day (QID) | ORAL | Status: DC | PRN
  Administered 2018-12-26: 650 mg via ORAL

## 2018-12-26 MED ORDER — EPOETIN ALFA 10000 UNIT/ML IJ SOLN
10000.0000 [IU] | Freq: Once | INTRAMUSCULAR | Status: AC
  Administered 2018-12-26: 15:00:00 10000 [IU] via SUBCUTANEOUS
  Filled 2018-12-26: qty 1

## 2018-12-26 NOTE — Assessment & Plan Note (Addendum)
1. MGUS: - He was being worked up for CKD on 08/06/2018 showed M spike of 6.9% on 24-hour urine. -Skeletal survey on 10/12/2018 did not show any lytic lesions. - Myeloma labs showed M spike of 0.8 g of IgG kappa protein.  Kappa light chains are elevated at 1451.  Lambda light chains are 22 and the ratio was 63.96. -Bone marrow biopsy on 11/26/2018 showed hypercellular marrow for age with 12% plasma cells.  Background showed trilineage hematopoiesis with relative maintenance of erythroid precursors.  Significant dyspoiesis or increase in blastic cells is not identified. - Multiple myeloma FISH panel showed gain in chromosome 1 q. (Poor risk indicator) and 13 q.  Deletion/monosomy 13 (standard risk indicator). -PET scan has been ordered for 01/13/2019. - She will follow-up with Dr. Delton Coombes after her PET scan.  2.  Macrocytic anemia: -This is from combination of CKD and MGUS. - As he was requiring blood transfusions, Procrit 20,000 units every 2 weeks which was started on 10/16/2018.  It was then increased to 30,000 units every week on 11/14/2018. - Labs on 12/26/2018 showed hemoglobin 8.8, ferritin 195, percent saturation 5. -We will set him up with 2 infusions of IV iron.  He will continue Procrit every week. - We will see him back in 1 week to reevaluate with labs.

## 2018-12-26 NOTE — Progress Notes (Signed)
[place] reviewed with and pt seen by RLockamy NP and pt approved for Procrit injection today per NP                          Lewayne Bunting. tolerated Procrit injection well without complaints or incident. Hgb 8.8 today. VSS Pt discharged via wheelchair in satisfactory condition

## 2018-12-26 NOTE — Patient Instructions (Signed)
[facility] at [facility] Discharge Instructions  Follow up next week   Thank you for choosing [place] at [facility] to provide your oncology and hematology care.  To afford each patient quality time with our provider, please arrive at least 15 minutes before your scheduled appointment time.   If you have a lab appointment with the [facility] please come in thru the  Main Entrance and check in at the main information desk  You need to re-schedule your appointment should you arrive 10 or more minutes late.  We strive to give you quality time with our providers, and arriving late affects you and other patients whose appointments are after yours.  Also, if you no show three or more times for appointments you may be dismissed from the clinic at the providers discretion.     Again, thank you for choosing [facility].  Our hope is that these requests will decrease the amount of time that you wait before being seen by our physicians.       _____________________________________________________________  Should you have questions after your visit to [facility], please contact our office at [phone] between the hours of 8:00 a.m. and 4:30 p.m.  Voicemails left after 4:00 p.m. will not be returned until the following business day.  For prescription refill requests, have your pharmacy contact our office and allow 72 hours.    Cancer Center Support Programs:   > Cancer Support Group  2nd Tuesday of the month 1pm-2pm, Journey Room

## 2018-12-26 NOTE — Progress Notes (Signed)
[place], [place] [address]   CLINIC:  Medical Oncology/Hematology  PCP:  [id number] [phone]   REASON FOR VISIT: Follow-up for MGUS  CURRENT THERAPY: Procrit weekly   INTERVAL HISTORY:  [name] 85 y.o. male returns for routine follow-up for MGUS.  He reports increasing fatigue and shortness of breath with activity.  Denies any nausea, vomiting, or diarrhea. Denies any new pains. Had not noticed any recent bleeding such as epistaxis, hematuria or hematochezia. Denies recent chest pain on exertion, shortness of breath on minimal exertion, pre-syncopal episodes, or palpitations. Denies any numbness or tingling in hands or feet. Denies any recent fevers, infections, or recent hospitalizations. Patient reports appetite at 75% and energy level at 0%.    REVIEW OF SYSTEMS:  Review of Systems  Constitutional: Positive for fatigue.  Respiratory: Positive for shortness of breath.   Cardiovascular: Positive for leg swelling.  All other systems reviewed and are negative.    PAST MEDICAL/SURGICAL HISTORY:  Past Medical History:  Diagnosis Date  . Acute ST elevation myocardial infarction (STEMI) of inferior wall (HCC) 11/03/2017   PCI/DESx3 to RCA after wire dissection. EF 45-50%  . BPH (benign prostatic hyperplasia)   . CAD (coronary artery disease)    Multivessel/left main disease  . Hypothyroidism   . Mitral regurgitation   . Mixed hyperlipidemia   . Type 2 diabetes mellitus ([place])    Past Surgical History:  Procedure Laterality Date  . CORONARY STENT INTERVENTION N/A 11/03/2017   Procedure: CORONARY STENT INTERVENTION;  Surgeon: Lorretta Harp, MD;  Location: [facility];  Service: Cardiovascular;  Laterality: N/A;  . CORONARY/GRAFT ACUTE MI REVASCULARIZATION N/A 11/03/2017   Procedure: Coronary/Graft Acute MI Revascularization;  Surgeon: Lorretta Harp, MD;  Location: [facility];  Service: Cardiovascular;  Laterality: N/A;  . LEFT HEART CATH AND CORONARY ANGIOGRAPHY N/A 11/03/2017   Procedure: LEFT HEART CATH AND CORONARY ANGIOGRAPHY;  Surgeon: Lorretta Harp, MD;  Location: [facility];  Service: Cardiovascular;  Laterality: N/A;  . STOMACH SURGERY     Treatment of an abscess     SOCIAL HISTORY:  Social History   Socioeconomic History  . Marital status: Married    Spouse name: Not on file  . Number of children: 1  . Years of education: Not on file  . Highest education level: Not on file  Occupational History  . Not on file  Social Needs  . Financial resource strain: Not hard at all  . Food insecurity    Worry: Never true    Inability: Never true  . Transportation needs    Medical: No    Non-medical: No  Tobacco Use  . Smoking status: Former Smoker    Packs/day: 1.00    Years: 40.00    Pack years: 40.00    Types: Cigarettes    Quit date: 06/26/1982    Years since quitting: 36.5  . Smokeless tobacco: Never Used  Substance and Sexual Activity  . Alcohol use: No  . Drug use: No  . Sexual activity: Not on file  Lifestyle  . Physical activity    Days per week: 0 days    Minutes per session: 0 min  . Stress: Not at all  Relationships  . Social connections    Talks on phone: Once a week    Gets together: Once a week    Attends  religious service: More than 4 times per year    Active member of club or organization: Yes    Attends meetings of clubs or organizations: More than 4 times per year    Relationship status: Married  . Intimate partner violence    Fear of current or ex partner: No    Emotionally abused: No    Physically abused: No    Forced sexual activity: No  Other Topics Concern  . Not on file  Social History Narrative  . Not on file    FAMILY HISTORY:  Family History  Problem Relation Age of Onset  . Heart failure Father 53  . Parkinsonism Mother 72  . Lung cancer Brother     CURRENT  MEDICATIONS:  Outpatient Encounter Medications as of 12/26/2018  Medication Sig  . aspirin EC 81 MG EC tablet Take 1 tablet (81 mg total) by mouth daily.  Marland Kitchen atorvastatin (LIPITOR) 80 MG tablet Take 1 tablet (80 mg total) by mouth daily.  . carvedilol (COREG) 3.125 MG tablet Take 1 tablet (3.125 mg total) by mouth 2 (two) times daily with a meal.  . clopidogrel (PLAVIX) 75 MG tablet Take 1 tablet (75 mg total) by mouth daily.  . finasteride (PROSCAR) 5 MG tablet Take 5 mg by mouth daily.  . furosemide (LASIX) 20 MG tablet Take 1 tablet (20 mg total) by mouth every other day.  . levothyroxine (SYNTHROID, LEVOTHROID) 50 MCG tablet Take 50 mcg by mouth daily.   . nitroGLYCERIN (NITROSTAT) 0.4 MG SL tablet Place 1 tablet (0.4 mg total) under the tongue every 5 (five) minutes x 3 doses as needed for chest pain.  . pantoprazole (PROTONIX) 40 MG tablet Take 40 mg by mouth 2 (two) times daily.  . tamsulosin (FLOMAX) 0.4 MG CAPS capsule Take 0.4 mg by mouth daily.    No facility-administered encounter medications on file as of 12/26/2018.     ALLERGIES:  No Known Allergies   PHYSICAL EXAM:  ECOG Performance status: 1  Vitals:   12/26/18 1258  BP: 125/63  Pulse: 61  Resp: 18  Temp: (!) 96.9 F (36.1 C)  SpO2: 93%   Filed Weights   12/26/18 1258  Weight: 148 lb (67.1 kg)    Physical Exam Constitutional:      Appearance: Normal appearance. He is normal weight.  Cardiovascular:     Rate and Rhythm: Normal rate and regular rhythm.     Heart sounds: Normal heart sounds.  Pulmonary:     Effort: Pulmonary effort is normal.     Breath sounds: Normal breath sounds.  Abdominal:     General: Bowel sounds are normal.     Palpations: Abdomen is soft.  Musculoskeletal:     Comments: Weakness walks with a cane  Skin:    General: Skin is warm and dry.  Neurological:     Mental Status: He is alert and oriented to person, place, and time. Mental status is at baseline.  Psychiatric:         Mood and Affect: Mood normal.        Behavior: Behavior normal.        Thought Content: Thought content normal.        Judgment: Judgment normal.      LABORATORY DATA:  I have reviewed the labs as listed.  CBC    Component Value Date/Time   WBC 5.0 12/26/2018 1221   RBC 2.93 (L) 12/26/2018 1221   HGB 8.8 (L) 12/26/2018 1221  HCT 30.2 (L) 12/26/2018 1221   HCT 25.3 (L) 04/01/2018 1446   PLT 248 12/26/2018 1221   MCV 103.1 (H) 12/26/2018 1221   MCH 30.0 12/26/2018 1221   MCHC 29.1 (L) 12/26/2018 1221   RDW 15.4 12/26/2018 1221   LYMPHSABS 0.7 12/26/2018 1221   MONOABS 0.5 12/26/2018 1221   EOSABS 0.1 12/26/2018 1221   BASOSABS 0.0 12/26/2018 1221   CMP Latest Ref Rng & Units 09/30/2018 04/05/2018 04/04/2018  Glucose 70 - 99 mg/dL 102(H) 115(H) 123(H)  BUN 8 - 23 mg/dL 51(H) 64(H) 63(H)  Creatinine 0.61 - 1.24 mg/dL 3.05(H) 2.28(H) 2.36(H)  Sodium 135 - 145 mmol/L 139 140 139  Potassium 3.5 - 5.1 mmol/L 5.1 4.5 4.5  Chloride 98 - 111 mmol/L 111 105 108  CO2 22 - 32 mmol/L 20(L) 24 24  Calcium 8.9 - 10.3 mg/dL 8.6(L) 8.7(L) 8.4(L)  Total Protein 6.5 - 8.1 g/dL 7.3 - -  Total Bilirubin 0.3 - 1.2 mg/dL 0.4 - -  Alkaline Phos 38 - 126 U/L 68 - -  AST 15 - 41 U/L 21 - -  ALT 0 - 44 U/L 24 - -    I personally performed a face-to-face visit.  All questions were answered to patient's stated satisfaction. Encouraged patient to call with any new concerns or questions before his next visit to the cancer center and we can certain see him sooner, if needed.     ASSESSMENT & PLAN:   MGUS (monoclonal gammopathy of unknown significance) 1. MGUS: - He was being worked up for CKD on 08/06/2018 showed M spike of 6.9% on 24-hour urine. -Skeletal survey on 10/12/2018 did not show any lytic lesions. - Myeloma labs showed M spike of 0.8 g of IgG kappa protein.  Kappa light chains are elevated at 1451.  Lambda light chains are 22 and the ratio was 63.96. -Bone marrow biopsy on 11/26/2018  showed hypercellular marrow for age with 12% plasma cells.  Background showed trilineage hematopoiesis with relative maintenance of erythroid precursors.  Significant dyspoiesis or increase in blastic cells is not identified. - Multiple myeloma FISH panel showed gain in chromosome 1 q. (Poor risk indicator) and 13 q.  Deletion/monosomy 13 (standard risk indicator). -PET scan has been ordered for 01/13/2019. - She will follow-up with Dr. Delton Coombes after her PET scan.  2.  Macrocytic anemia: -This is from combination of CKD and MGUS. - As he was requiring blood transfusions, Procrit 20,000 units every 2 weeks which was started on 10/16/2018.  It was then increased to 30,000 units every week on 11/14/2018. - Labs on 12/26/2018 showed hemoglobin 8.8, ferritin 195, percent saturation 5. -We will set him up with 2 infusions of IV iron.  He will continue Procrit every week. - We will see him back in 1 week to reevaluate with labs.       Orders placed this encounter:  Orders Placed This Encounter  Procedures  . Comprehensive metabolic panel     Francene Finders, FNP-C [place] [phone]

## 2018-12-26 NOTE — Patient Instructions (Signed)
[place] at [facility] Discharge Instructions  Received Procrit injection today. Follow-up as scheduled. Call clinic for any questions or concerns   Thank you for choosing [place] at [facility] to provide your oncology and hematology care.  To afford each patient quality time with our provider, please arrive at least 15 minutes before your scheduled appointment time.   If you have a lab appointment with the [facility] please come in thru the  Main Entrance and check in at the main information desk  You need to re-schedule your appointment should you arrive 10 or more minutes late.  We strive to give you quality time with our providers, and arriving late affects you and other patients whose appointments are after yours.  Also, if you no show three or more times for appointments you may be dismissed from the clinic at the providers discretion.     Again, thank you for choosing [facility].  Our hope is that these requests will decrease the amount of time that you wait before being seen by our physicians.       _____________________________________________________________  Should you have questions after your visit to [facility], please contact our office at [phone] between the hours of 8:00 a.m. and 4:30 p.m.  Voicemails left after 4:00 p.m. will not be returned until the following business day.  For prescription refill requests, have your pharmacy contact our office and allow 72 hours.    Cancer Center Support Programs:   > Cancer Support Group  2nd Tuesday of the month 1pm-2pm, Journey Room

## 2018-12-31 ENCOUNTER — Other Ambulatory Visit (HOSPITAL_COMMUNITY): Payer: Medicare Other

## 2018-12-31 ENCOUNTER — Ambulatory Visit (HOSPITAL_COMMUNITY): Payer: Medicare Other

## 2019-01-01 ENCOUNTER — Other Ambulatory Visit: Payer: Self-pay

## 2019-01-01 ENCOUNTER — Inpatient Hospital Stay (HOSPITAL_COMMUNITY): Payer: Medicare Other

## 2019-01-01 ENCOUNTER — Encounter (HOSPITAL_COMMUNITY): Payer: Self-pay

## 2019-01-01 VITALS — BP 142/62 | HR 70 | Temp 96.7°F | Resp 18

## 2019-01-01 DIAGNOSIS — N189 Chronic kidney disease, unspecified: Secondary | ICD-10-CM | POA: Diagnosis not present

## 2019-01-01 DIAGNOSIS — D649 Anemia, unspecified: Secondary | ICD-10-CM

## 2019-01-01 MED ORDER — SODIUM CHLORIDE 0.9 % IV SOLN
INTRAVENOUS | Status: DC
  Administered 2019-01-01: 13:00:00 via INTRAVENOUS

## 2019-01-01 MED ORDER — SODIUM CHLORIDE 0.9 % IV SOLN: INTRAVENOUS | Status: DC

## 2019-01-01 MED ORDER — SODIUM CHLORIDE 0.9 % IV SOLN
510.0000 mg | Freq: Once | INTRAVENOUS | Status: AC
  Administered 2019-01-01: 510 mg via INTRAVENOUS
  Filled 2019-01-01: qty 510

## 2019-01-01 NOTE — Progress Notes (Signed)
Randy Fitzpatrick. tolerated Feraheme infusion well without complaints or incident. Peripheral IV site checked with positive blood return noted prior to and after infusion. VSS upon discharge. Pt discharged via wheelchair in satisfactory condition

## 2019-01-01 NOTE — Patient Instructions (Signed)
[facility] at [facility] Discharge Instructions  Received Feraheme infusion today. Follow-up as scheduled. Call clinic for any questions or concerns   Thank you for choosing [facility] at [facility] to provide your oncology and hematology care.  To afford each patient quality time with our provider, please arrive at least 15 minutes before your scheduled appointment time.   If you have a lab appointment with the Cancer Center please come in thru the  Main Entrance and check in at the main information desk  You need to re-schedule your appointment should you arrive 10 or more minutes late.  We strive to give you quality time with our providers, and arriving late affects you and other patients whose appointments are after yours.  Also, if you no show three or more times for appointments you may be dismissed from the clinic at the providers discretion.     Again, thank you for choosing [facility].  Our hope is that these requests will decrease the amount of time that you wait before being seen by our physicians.       _____________________________________________________________  Should you have questions after your visit to [facility], please contact our office at [phone] between the hours of 8:00 a.m. and 4:30 p.m.  Voicemails left after 4:00 p.m. will not be returned until the following business day.  For prescription refill requests, have your pharmacy contact our office and allow 72 hours.    Cancer Center Support Programs:   > Cancer Support Group  2nd Tuesday of the month 1pm-2pm, Journey Room   

## 2019-01-02 ENCOUNTER — Inpatient Hospital Stay (HOSPITAL_COMMUNITY): Payer: Medicare Other

## 2019-01-02 ENCOUNTER — Encounter (HOSPITAL_COMMUNITY): Payer: Self-pay | Admitting: Nurse Practitioner

## 2019-01-02 ENCOUNTER — Inpatient Hospital Stay (HOSPITAL_BASED_OUTPATIENT_CLINIC_OR_DEPARTMENT_OTHER): Payer: Medicare Other | Admitting: Nurse Practitioner

## 2019-01-02 ENCOUNTER — Other Ambulatory Visit: Payer: Self-pay

## 2019-01-02 DIAGNOSIS — D472 Monoclonal gammopathy: Secondary | ICD-10-CM | POA: Diagnosis not present

## 2019-01-02 DIAGNOSIS — D649 Anemia, unspecified: Secondary | ICD-10-CM

## 2019-01-02 DIAGNOSIS — N189 Chronic kidney disease, unspecified: Secondary | ICD-10-CM | POA: Diagnosis not present

## 2019-01-02 DIAGNOSIS — D509 Iron deficiency anemia, unspecified: Secondary | ICD-10-CM

## 2019-01-02 DIAGNOSIS — D631 Anemia in chronic kidney disease: Secondary | ICD-10-CM

## 2019-01-02 DIAGNOSIS — E039 Hypothyroidism, unspecified: Secondary | ICD-10-CM

## 2019-01-02 DIAGNOSIS — E782 Mixed hyperlipidemia: Secondary | ICD-10-CM

## 2019-01-02 DIAGNOSIS — Z87891 Personal history of nicotine dependence: Secondary | ICD-10-CM

## 2019-01-02 DIAGNOSIS — E1122 Type 2 diabetes mellitus with diabetic chronic kidney disease: Secondary | ICD-10-CM

## 2019-01-02 LAB — CBC WITH DIFFERENTIAL/PLATELET
Abs Immature Granulocytes: 0.02 10*3/uL (ref 0.00–0.07)
Basophils Absolute: 0 10*3/uL (ref 0.0–0.1)
Basophils Relative: 0 %
Eosinophils Absolute: 0.1 10*3/uL (ref 0.0–0.5)
Eosinophils Relative: 1 %
HCT: 30.3 % — ABNORMAL LOW (ref 39.0–52.0)
Hemoglobin: 8.6 g/dL — ABNORMAL LOW (ref 13.0–17.0)
Immature Granulocytes: 0 %
Lymphocytes Relative: 12 %
Lymphs Abs: 0.5 10*3/uL — ABNORMAL LOW (ref 0.7–4.0)
MCH: 29.6 pg (ref 26.0–34.0)
MCHC: 28.4 g/dL — ABNORMAL LOW (ref 30.0–36.0)
MCV: 104.1 fL — ABNORMAL HIGH (ref 80.0–100.0)
Monocytes Absolute: 0.5 10*3/uL (ref 0.1–1.0)
Monocytes Relative: 10 %
Neutro Abs: 3.4 10*3/uL (ref 1.7–7.7)
Neutrophils Relative %: 77 %
Platelets: 295 10*3/uL (ref 150–400)
RBC: 2.91 MIL/uL — ABNORMAL LOW (ref 4.22–5.81)
RDW: 15.9 % — ABNORMAL HIGH (ref 11.5–15.5)
WBC: 4.5 10*3/uL (ref 4.0–10.5)
nRBC: 0 % (ref 0.0–0.2)

## 2019-01-02 LAB — COMPREHENSIVE METABOLIC PANEL
ALT: 14 U/L (ref 0–44)
AST: 16 U/L (ref 15–41)
Albumin: 2.7 g/dL — ABNORMAL LOW (ref 3.5–5.0)
Alkaline Phosphatase: 61 U/L (ref 38–126)
Anion gap: 10 (ref 5–15)
BUN: 66 mg/dL — ABNORMAL HIGH (ref 8–23)
CO2: 19 mmol/L — ABNORMAL LOW (ref 22–32)
Calcium: 8.2 mg/dL — ABNORMAL LOW (ref 8.9–10.3)
Chloride: 109 mmol/L (ref 98–111)
Creatinine, Ser: 4.68 mg/dL — ABNORMAL HIGH (ref 0.61–1.24)
GFR calc Af Amer: 12 mL/min — ABNORMAL LOW (ref >60)
GFR calc non Af Amer: 10 mL/min — ABNORMAL LOW (ref >60)
Glucose, Bld: 161 mg/dL — ABNORMAL HIGH (ref 70–99)
Potassium: 5.3 mmol/L — ABNORMAL HIGH (ref 3.5–5.1)
Sodium: 138 mmol/L (ref 135–145)
Total Bilirubin: 0.6 mg/dL (ref 0.3–1.2)
Total Protein: 6 g/dL — ABNORMAL LOW (ref 6.5–8.1)

## 2019-01-02 MED ORDER — EPOETIN ALFA 20000 UNIT/ML IJ SOLN
20000.0000 [IU] | Freq: Once | INTRAMUSCULAR | Status: AC
  Administered 2019-01-02: 20000 [IU] via SUBCUTANEOUS

## 2019-01-02 MED ORDER — EPOETIN ALFA 10000 UNIT/ML IJ SOLN
10000.0000 [IU] | Freq: Once | INTRAMUSCULAR | Status: AC
  Administered 2019-01-02: 10000 [IU] via SUBCUTANEOUS
  Filled 2019-01-02: qty 1

## 2019-01-02 NOTE — Patient Instructions (Addendum)
[place] at [facility]  Discharge Instructions: follow up next week with labs   You saw Francene Finders, NP, today. _______________________________________________________________  Thank you for choosing [place] at [facility] to provide your oncology and hematology care.  To afford each patient quality time with our providers, please arrive at least 15 minutes before your scheduled appointment.  You need to re-schedule your appointment if you arrive 10 or more minutes late.  We strive to give you quality time with our providers, and arriving late affects you and other patients whose appointments are after yours.  Also, if you no show three or more times for appointments you may be dismissed from the clinic.  Again, thank you for choosing [place] at [place] hope is that these requests will allow you access to exceptional care and in a timely manner. _______________________________________________________________  If you have questions after your visit, please contact our office at [phone] between the hours of 8:30 a.m. and 5:00 p.m. Voicemails left after 4:30 p.m. will not be returned until the following business day. _______________________________________________________________  For prescription refill requests, have your pharmacy contact our office. _______________________________________________________________  Recommendations made by the consultant and any test results will be sent to your referring physician. _______________________________________________________________

## 2019-01-02 NOTE — Assessment & Plan Note (Addendum)
1. MGUS: - He was being worked up for CKD on 08/06/2018 showed M spike of 6.9% on 24-hour urine. -Skeletal survey on 10/12/2018 did not show any lytic lesions. - Myeloma labs showed M spike of 0.8 g of IgG kappa protein.  Kappa light chains are elevated at 1451.  Lambda light chains are 22 and the ratio was 63.96. -Bone marrow biopsy on 11/26/2018 showed hypercellular marrow for age with 12% plasma cells.  Background showed trilineage hematopoiesis with relative maintenance of erythroid precursors.  Significant dyspoiesis or increase in blastic cells is not identified. - Multiple myeloma FISH panel showed gain in chromosome 1 q. (Poor risk indicator) and 13 q.  Deletion/monosomy 13 (standard risk indicator). -PET scan has been ordered for 01/13/2019. - She will follow-up with Dr. Delton Coombes after her PET scan.  2.  Macrocytic anemia: -This is from combination of CKD and MGUS. - As he was requiring blood transfusions, Procrit 20,000 units every 2 weeks which was started on 10/16/2018.  It was then increased to 30,000 units every week on 11/14/2018. - Labs on 01/02/2019 showed his globin 8.6. -He received 1 infusion of iron yesterday on 01/01/2019.  He will get his second infusion next week.  He will continue Procrit every week. - We will see him back in 1 week to reevaluate with labs.

## 2019-01-02 NOTE — Progress Notes (Signed)
Patient tolerated injection with no complaints voiced.  Site clean and dry with no bruising or swelling noted at site.  Band aid applied.  Vss with discharge and left ambulatory with no s/s of distress noted.  

## 2019-01-02 NOTE — Progress Notes (Signed)
Randy Fitzpatrick, Randy Fitzpatrick [id number]   CLINIC:  Medical Oncology/Hematology  PCP:  [facility] [id number] [phone]   REASON FOR VISIT: Follow-up for MGUS  CURRENT THERAPY: Procrit weekly   INTERVAL HISTORY:  Randy Fitzpatrick 85 y.o. male returns for routine follow-up for MGUS.  He reports he is still feeling fatigued and short of breath.  He has decreased appetite and depression as well.  He reports he has noticed his stools being a little darker over the past couple days he will keep an eye on this and report back to [place].  Denies any nausea, vomiting, or diarrhea. Denies any new pains. Had not noticed any recent bleeding such as epistaxis, hematuria or hematochezia. Denies recent chest pain on exertion, shortness of breath on minimal exertion, pre-syncopal episodes, or palpitations. Denies any numbness or tingling in hands or feet. Denies any recent fevers, infections, or recent hospitalizations. Patient reports appetite at 50% and energy level at 0%.   REVIEW OF SYSTEMS:  Review of Systems  Constitutional: Positive for fatigue.  Respiratory: Positive for shortness of breath.   Hematological: Bruises/bleeds easily.  Psychiatric/Behavioral: Positive for depression.  All other systems reviewed and are negative.    PAST MEDICAL/SURGICAL HISTORY:  Past Medical History:  Diagnosis Date  . Acute ST elevation myocardial infarction (STEMI) of inferior wall (HCC) 11/03/2017   PCI/DESx3 to RCA after wire dissection. EF 45-50%  . BPH (benign prostatic hyperplasia)   . CAD (coronary artery disease)    Multivessel/left main disease  . Hypothyroidism   . Mitral regurgitation   . Mixed hyperlipidemia   . Type 2 diabetes mellitus ([place])    Past Surgical History:  Procedure Laterality Date  . CORONARY STENT INTERVENTION N/A 11/03/2017   Procedure: CORONARY STENT INTERVENTION;  Surgeon: Lorretta Harp, MD;  Location: [facility];  Service: Cardiovascular;  Laterality: N/A;  . CORONARY/GRAFT ACUTE MI REVASCULARIZATION N/A 11/03/2017   Procedure: Coronary/Graft Acute MI Revascularization;  Surgeon: Lorretta Harp, MD;  Location: [facility];  Service: Cardiovascular;  Laterality: N/A;  . LEFT HEART CATH AND CORONARY ANGIOGRAPHY N/A 11/03/2017   Procedure: LEFT HEART CATH AND CORONARY ANGIOGRAPHY;  Surgeon: Lorretta Harp, MD;  Location: [facility];  Service: Cardiovascular;  Laterality: N/A;  . STOMACH SURGERY     Treatment of an abscess     SOCIAL HISTORY:  Social History   Socioeconomic History  . Marital status: Married    Spouse name: Not on file  . Number of children: 1  . Years of education: Not on file  . Highest education level: Not on file  Occupational History  . Not on file  Social Needs  . Financial resource strain: Not hard at all  . Food insecurity    Worry: Never true    Inability: Never true  . Transportation needs    Medical: No    Non-medical: No  Tobacco Use  . Smoking status: Former Smoker    Packs/day: 1.00    Years: 40.00    Pack years: 40.00    Types: Cigarettes    Quit date: 06/26/1982    Years since quitting: 36.5  . Smokeless tobacco: Never Used  Substance and Sexual Activity  . Alcohol use: No  . Drug use: No  . Sexual activity: Not on file  Lifestyle  . Physical activity    Days per week: 0 days  Minutes per session: 0 min  . Stress: Not at all  Relationships  . Social Herbalist on phone: Once a week    Gets together: Once a week    Attends religious service: More than 4 times per year    Active member of club or organization: Yes    Attends meetings of clubs or organizations: More than 4 times per year    Relationship status: Married  . Intimate partner violence    Fear of current or ex partner: No    Emotionally abused: No    Physically abused: No    Forced sexual activity: No  Other Topics Concern  . Not on  file  Social History Narrative  . Not on file    FAMILY HISTORY:  Family History  Problem Relation Age of Onset  . Heart failure Father 75  . Parkinsonism Mother 25  . Lung cancer Brother     CURRENT MEDICATIONS:  Outpatient Encounter Medications as of 01/02/2019  Medication Sig  . aspirin EC 81 MG EC tablet Take 1 tablet (81 mg total) by mouth daily.  Marland Kitchen atorvastatin (LIPITOR) 80 MG tablet Take 1 tablet (80 mg total) by mouth daily.  . carvedilol (COREG) 3.125 MG tablet Take 1 tablet (3.125 mg total) by mouth 2 (two) times daily with a meal.  . clopidogrel (PLAVIX) 75 MG tablet Take 1 tablet (75 mg total) by mouth daily.  . finasteride (PROSCAR) 5 MG tablet Take 5 mg by mouth daily.  . furosemide (LASIX) 20 MG tablet Take 1 tablet (20 mg total) by mouth every other day.  . levothyroxine (SYNTHROID, LEVOTHROID) 50 MCG tablet Take 50 mcg by mouth daily.   . nitroGLYCERIN (NITROSTAT) 0.4 MG SL tablet Place 1 tablet (0.4 mg total) under the tongue every 5 (five) minutes x 3 doses as needed for chest pain.  . pantoprazole (PROTONIX) 40 MG tablet Take 40 mg by mouth 2 (two) times daily.  . tamsulosin (FLOMAX) 0.4 MG CAPS capsule Take 0.4 mg by mouth daily.    No facility-administered encounter medications on file as of 01/02/2019.     ALLERGIES:  No Known Allergies   PHYSICAL EXAM:  ECOG Performance status: 1  Vitals:   01/02/19 1154  BP: (!) 120/51  Pulse: 60  Resp: (!) 22  Temp: (!) 97.3 F (36.3 C)  SpO2: 100%   Filed Weights   01/02/19 1154  Weight: 145 lb 12.8 oz (66.1 kg)    Physical Exam Constitutional:      Appearance: Normal appearance. He is normal weight.  Cardiovascular:     Rate and Rhythm: Normal rate and regular rhythm.     Heart sounds: Normal heart sounds.  Pulmonary:     Effort: Pulmonary effort is normal.     Breath sounds: Normal breath sounds.  Abdominal:     General: Bowel sounds are normal.     Palpations: Abdomen is soft.  Musculoskeletal:  Normal range of motion.  Skin:    General: Skin is warm and dry.  Neurological:     Mental Status: He is alert and oriented to person, place, and time. Mental status is at baseline.  Psychiatric:        Mood and Affect: Mood normal.        Behavior: Behavior normal.        Thought Content: Thought content normal.        Judgment: Judgment normal.      LABORATORY DATA:  I have reviewed the labs as listed.  CBC    Component Value Date/Time   WBC 4.5 01/02/2019 1106   RBC 2.91 (L) 01/02/2019 1106   HGB 8.6 (L) 01/02/2019 1106   HCT 30.3 (L) 01/02/2019 1106   HCT 25.3 (L) 04/01/2018 1446   PLT 295 01/02/2019 1106   MCV 104.1 (H) 01/02/2019 1106   MCH 29.6 01/02/2019 1106   MCHC 28.4 (L) 01/02/2019 1106   RDW 15.9 (H) 01/02/2019 1106   LYMPHSABS 0.5 (L) 01/02/2019 1106   MONOABS 0.5 01/02/2019 1106   EOSABS 0.1 01/02/2019 1106   BASOSABS 0.0 01/02/2019 1106   CMP Latest Ref Rng & Units 01/02/2019 09/30/2018 04/05/2018  Glucose 70 - 99 mg/dL 161(H) 102(H) 115(H)  BUN 8 - 23 mg/dL 66(H) 51(H) 64(H)  Creatinine 0.61 - 1.24 mg/dL 4.68(H) 3.05(H) 2.28(H)  Sodium 135 - 145 mmol/L 138 139 140  Potassium 3.5 - 5.1 mmol/L 5.3(H) 5.1 4.5  Chloride 98 - 111 mmol/L 109 111 105  CO2 22 - 32 mmol/L 19(L) 20(L) 24  Calcium 8.9 - 10.3 mg/dL 8.2(L) 8.6(L) 8.7(L)  Total Protein 6.5 - 8.1 g/dL 6.0(L) 7.3 -  Total Bilirubin 0.3 - 1.2 mg/dL 0.6 0.4 -  Alkaline Phos 38 - 126 U/L 61 68 -  AST 15 - 41 U/L 16 21 -  ALT 0 - 44 U/L 14 24 -    I personally performed a face-to-face visit.  All questions were answered to patient's stated satisfaction. Encouraged patient to call with any new concerns or questions before his next visit to the cancer center and we can certain see him sooner, if needed.     ASSESSMENT & PLAN:   MGUS (monoclonal gammopathy of unknown significance) 1. MGUS: - He was being worked up for CKD on 08/06/2018 showed M spike of 6.9% on 24-hour urine. -Skeletal survey on  10/12/2018 did not show any lytic lesions. - Myeloma labs showed M spike of 0.8 g of IgG kappa protein.  Kappa light chains are elevated at 1451.  Lambda light chains are 22 and the ratio was 63.96. -Bone marrow biopsy on 11/26/2018 showed hypercellular marrow for age with 12% plasma cells.  Background showed trilineage hematopoiesis with relative maintenance of erythroid precursors.  Significant dyspoiesis or increase in blastic cells is not identified. - Multiple myeloma FISH panel showed gain in chromosome 1 q. (Poor risk indicator) and 13 q.  Deletion/monosomy 13 (standard risk indicator). -PET scan has been ordered for 01/13/2019. - She will follow-up with Dr. Delton Coombes after her PET scan.  2.  Macrocytic anemia: -This is from combination of CKD and MGUS. - As he was requiring blood transfusions, Procrit 20,000 units every 2 weeks which was started on 10/16/2018.  It was then increased to 30,000 units every week on 11/14/2018. - Labs on 01/02/2019 showed his globin 8.6. -He received 1 infusion of iron yesterday on 01/01/2019.  He will get his second infusion next week.  He will continue Procrit every week. - We will see him back in 1 week to reevaluate with labs.       Orders placed this encounter:  Orders Placed This Encounter  Procedures  . CBC with Differential/Platelet  . Comprehensive metabolic panel  . VITAMIN D 25 Hydroxy (Vit-D Deficiency, Fractures)  . Vitamin B12      Francene Finders FNP-C [facility] [phone]

## 2019-01-03 ENCOUNTER — Telehealth: Payer: Self-pay | Admitting: Cardiology

## 2019-01-03 NOTE — Telephone Encounter (Signed)
Patient walk in  Patient came in complaining of being SOB and no strength.  Stated that he feels he has a lot of fluid building up and wanted to know what he could do to avoid going to hospital

## 2019-01-03 NOTE — Telephone Encounter (Signed)
Patient informed and verbalized understanding of plan. 

## 2019-01-03 NOTE — Telephone Encounter (Signed)
Pt walked into office c/o SOB and swelling starting this morning at 3-4 am - says he could not lay down flat became SOB and had to sit in his recliner - no visible swelling but obviously SOB when taking a few steps - pt takes lasix 20 mg every other day which he has taken this morning - denies any dizziness/chest pain - pt thinks he has extra fluid and this is causing SOB says he has had this problem before. BP by nursing check 118/52 HR 66 O2 98% - did have infusion yesterday (Epic)

## 2019-01-03 NOTE — Telephone Encounter (Signed)
Yes, this has been a recurring problem for him that has generally improved with Lasix.  He had an iron infusion yesterday.  Vital signs look to be stable however.  Would certainly have him take the Lasix every day for now (take an extra today as well), but if his symptoms do not improve quickly he may still need to be seen in the ER, mainly to make sure there is not something else going on.

## 2019-01-08 ENCOUNTER — Inpatient Hospital Stay (HOSPITAL_COMMUNITY): Payer: Medicare Other

## 2019-01-08 ENCOUNTER — Other Ambulatory Visit: Payer: Self-pay

## 2019-01-08 ENCOUNTER — Encounter (HOSPITAL_COMMUNITY): Payer: Self-pay

## 2019-01-08 VITALS — BP 142/60 | HR 67 | Temp 97.5°F | Resp 16

## 2019-01-08 DIAGNOSIS — N189 Chronic kidney disease, unspecified: Secondary | ICD-10-CM | POA: Diagnosis not present

## 2019-01-08 DIAGNOSIS — D649 Anemia, unspecified: Secondary | ICD-10-CM

## 2019-01-08 MED ORDER — SODIUM CHLORIDE 0.9 % IV SOLN
INTRAVENOUS | Status: DC
  Administered 2019-01-08: 14:00:00 via INTRAVENOUS

## 2019-01-08 MED ORDER — SODIUM CHLORIDE 0.9 % IV SOLN
510.0000 mg | Freq: Once | INTRAVENOUS | Status: AC
  Administered 2019-01-08: 510 mg via INTRAVENOUS
  Filled 2019-01-08: qty 510

## 2019-01-08 NOTE — Progress Notes (Signed)
Patient tolerated iron infusion with no complaints voiced.  Peripheral IV site with good blood return noted before and after infusion.  No bruising or swelling noted at site and patient denied pain.  Band aid applied.  VSS with discharge and left by wheelchair with no s/s of distress noted.

## 2019-01-09 ENCOUNTER — Inpatient Hospital Stay (HOSPITAL_COMMUNITY): Payer: Medicare Other

## 2019-01-09 ENCOUNTER — Inpatient Hospital Stay (HOSPITAL_BASED_OUTPATIENT_CLINIC_OR_DEPARTMENT_OTHER): Payer: Medicare Other | Admitting: Nurse Practitioner

## 2019-01-09 ENCOUNTER — Other Ambulatory Visit: Payer: Self-pay

## 2019-01-09 DIAGNOSIS — D509 Iron deficiency anemia, unspecified: Secondary | ICD-10-CM | POA: Diagnosis not present

## 2019-01-09 DIAGNOSIS — N189 Chronic kidney disease, unspecified: Secondary | ICD-10-CM

## 2019-01-09 DIAGNOSIS — E039 Hypothyroidism, unspecified: Secondary | ICD-10-CM

## 2019-01-09 DIAGNOSIS — D472 Monoclonal gammopathy: Secondary | ICD-10-CM

## 2019-01-09 DIAGNOSIS — D631 Anemia in chronic kidney disease: Secondary | ICD-10-CM

## 2019-01-09 DIAGNOSIS — D649 Anemia, unspecified: Secondary | ICD-10-CM

## 2019-01-09 DIAGNOSIS — Z87891 Personal history of nicotine dependence: Secondary | ICD-10-CM

## 2019-01-09 LAB — CBC WITH DIFFERENTIAL/PLATELET
Abs Immature Granulocytes: 0.03 10*3/uL (ref 0.00–0.07)
Basophils Absolute: 0 10*3/uL (ref 0.0–0.1)
Basophils Relative: 0 %
Eosinophils Absolute: 0 10*3/uL (ref 0.0–0.5)
Eosinophils Relative: 0 %
HCT: 33.8 % — ABNORMAL LOW (ref 39.0–52.0)
Hemoglobin: 10 g/dL — ABNORMAL LOW (ref 13.0–17.0)
Immature Granulocytes: 1 %
Lymphocytes Relative: 8 %
Lymphs Abs: 0.5 10*3/uL — ABNORMAL LOW (ref 0.7–4.0)
MCH: 30.5 pg (ref 26.0–34.0)
MCHC: 29.6 g/dL — ABNORMAL LOW (ref 30.0–36.0)
MCV: 103 fL — ABNORMAL HIGH (ref 80.0–100.0)
Monocytes Absolute: 0.6 10*3/uL (ref 0.1–1.0)
Monocytes Relative: 10 %
Neutro Abs: 5.2 10*3/uL (ref 1.7–7.7)
Neutrophils Relative %: 81 %
Platelets: 256 10*3/uL (ref 150–400)
RBC: 3.28 MIL/uL — ABNORMAL LOW (ref 4.22–5.81)
RDW: 17.6 % — ABNORMAL HIGH (ref 11.5–15.5)
WBC: 6.4 10*3/uL (ref 4.0–10.5)
nRBC: 0 % (ref 0.0–0.2)

## 2019-01-09 MED ORDER — EPOETIN ALFA 20000 UNIT/ML IJ SOLN
20000.0000 [IU] | Freq: Once | INTRAMUSCULAR | Status: AC
  Administered 2019-01-09: 20000 [IU] via SUBCUTANEOUS
  Filled 2019-01-09: qty 1

## 2019-01-09 MED ORDER — EPOETIN ALFA 10000 UNIT/ML IJ SOLN
10000.0000 [IU] | Freq: Once | INTRAMUSCULAR | Status: AC
  Administered 2019-01-09: 10000 [IU] via SUBCUTANEOUS
  Filled 2019-01-09: qty 1

## 2019-01-09 NOTE — Patient Instructions (Signed)
[facility] at [facility]  Discharge Instructions:   _______________________________________________________________  Thank you for choosing [facility] at [facility] to provide your oncology and hematology care.  To afford each patient quality time with our providers, please arrive at least 15 minutes before your scheduled appointment.  You need to re-schedule your appointment if you arrive 10 or more minutes late.  We strive to give you quality time with our providers, and arriving late affects you and other patients whose appointments are after yours.  Also, if you no show three or more times for appointments you may be dismissed from the clinic.  Again, thank you for choosing [facility] at [facility]. Our hope is that these requests will allow you access to exceptional care and in a timely manner. _______________________________________________________________  If you have questions after your visit, please contact our office at [phone] between the hours of 8:30 a.m. and 5:00 p.m. Voicemails left after 4:30 p.m. will not be returned until the following business day. _______________________________________________________________  For prescription refill requests, have your pharmacy contact our office. _______________________________________________________________  Recommendations made by the consultant and any test results will be sent to your referring physician. _______________________________________________________________ 

## 2019-01-09 NOTE — Patient Instructions (Signed)
[facility] at [facility] Discharge Instructions  PET SCAN Monday 01/13/2019 at 3pm.    Thank you for choosing [facility] at [facility] to provide your oncology and hematology care.  To afford each patient quality time with our provider, please arrive at least 15 minutes before your scheduled appointment time.   If you have a lab appointment with the [facility] please come in thru the  Main Entrance and check in at the main information desk  You need to re-schedule your appointment should you arrive 10 or more minutes late.  We strive to give you quality time with our providers, and arriving late affects you and other patients whose appointments are after yours.  Also, if you no show three or more times for appointments you may be dismissed from the clinic at the providers discretion.     Again, thank you for choosing [facility].  Our hope is that these requests will decrease the amount of time that you wait before being seen by our physicians.       _____________________________________________________________  Should you have questions after your visit to [facility], please contact our office at [phone] between the hours of 8:00 a.m. and 4:30 p.m.  Voicemails left after 4:00 p.m. will not be returned until the following business day.  For prescription refill requests, have your pharmacy contact our office and allow 72 hours.    Cancer Center Support Programs:   > Cancer Support Group  2nd Tuesday of the month 1pm-2pm, Journey Room

## 2019-01-09 NOTE — Assessment & Plan Note (Addendum)
1. MGUS: - He was being worked up for CKD on 08/06/2018 showed M spike of 6.9% on 24-hour urine. -Skeletal survey on 10/12/2018 did not show any lytic lesions. - Myeloma labs showed M spike of 0.8 g of IgG kappa protein.  Kappa light chains are elevated at 1451.  Lambda light chains are 22 and the ratio was 63.96. -Bone marrow biopsy on 11/26/2018 showed hypercellular marrow for age with 12% plasma cells.  Background showed trilineage hematopoiesis with relative maintenance of erythroid precursors.  Significant dyspoiesis or increase in blastic cells is not identified. - Multiple myeloma FISH panel showed gain in chromosome 1 q. (Poor risk indicator) and 13 q.  Deletion/monosomy 13 (standard risk indicator). -PET scan has been ordered for 01/13/2019. - She will follow-up with Dr. Delton Coombes after her PET scan.  2.  Macrocytic anemia: -This is from combination of CKD and MGUS. - As he was requiring blood transfusions, Procrit 20,000 units every 2 weeks which was started on 10/16/2018.  It was then increased to 30,000 units every week on 11/14/2018. - Labs on 01/02/2019 showed his globin 8.6. -He received 2 iron infusions 1 on 01/01/2019 and 1 on 01/08/2019. -Labs on 01/09/2019 has showed an increased response in his hemoglobin it is now 10.0. -She will continue weekly Procrit injections at 30,000 units. - We will see him back in 1 week after his PET scan.

## 2019-01-09 NOTE — Progress Notes (Signed)
Randy Fitzpatrick. presents today for injection per MD orders. Procrit 30,000 administered SQ in right Upper Arm. Administration without incident. Patient tolerated well.  Vital signs stable. No complaints at this time. Discharged from via wheel chair.  F/U with [facility] as scheduled.

## 2019-01-09 NOTE — Progress Notes (Signed)
Randy Fitzpatrick, [place] [address]   CLINIC:  Medical Oncology/Hematology  PCP:  [facility] [address] [phone]   REASON FOR VISIT: Follow-up for MGUS  CURRENT THERAPY: Procrit injections   INTERVAL HISTORY:  Randy Fitzpatrick 85 y.o. male returns for routine follow-up for MGUS.  He reports he is still very depressed and fatigue during the day.  He is also having diarrhea.  He does not have a lot of energy to do anything around the house during the day.  He lives alone at home. Denies any nausea, vomiting, or diarrhea. Denies any new pains. Had not noticed any recent bleeding such as epistaxis, hematuria or hematochezia. Denies recent chest pain on exertion, shortness of breath on minimal exertion, pre-syncopal episodes, or palpitations. Denies any numbness or tingling in hands or feet. Denies any recent fevers, infections, or recent hospitalizations. Patient reports appetite at 25% and energy level at 0%.  He is eating less but maintain his weight at this time.   REVIEW OF SYSTEMS:  Review of Systems  Constitutional: Positive for fatigue.  Gastrointestinal: Positive for diarrhea.  Psychiatric/Behavioral: Positive for depression.  All other systems reviewed and are negative.    PAST MEDICAL/SURGICAL HISTORY:  Past Medical History:  Diagnosis Date  . Acute ST elevation myocardial infarction (STEMI) of inferior wall (HCC) 11/03/2017   PCI/DESx3 to RCA after wire dissection. EF 45-50%  . BPH (benign prostatic hyperplasia)   . CAD (coronary artery disease)    Multivessel/left main disease  . Hypothyroidism   . Mitral regurgitation   . Mixed hyperlipidemia   . Type 2 diabetes mellitus ([place])    Past Surgical History:  Procedure Laterality Date  . CORONARY STENT INTERVENTION N/A 11/03/2017   Procedure: CORONARY STENT INTERVENTION;  Surgeon: Lorretta Harp, MD;  Location: [facility];  Service:  Cardiovascular;  Laterality: N/A;  . CORONARY/GRAFT ACUTE MI REVASCULARIZATION N/A 11/03/2017   Procedure: Coronary/Graft Acute MI Revascularization;  Surgeon: Lorretta Harp, MD;  Location: [facility];  Service: Cardiovascular;  Laterality: N/A;  . LEFT HEART CATH AND CORONARY ANGIOGRAPHY N/A 11/03/2017   Procedure: LEFT HEART CATH AND CORONARY ANGIOGRAPHY;  Surgeon: Lorretta Harp, MD;  Location: [facility];  Service: Cardiovascular;  Laterality: N/A;  . STOMACH SURGERY     Treatment of an abscess     SOCIAL HISTORY:  Social History   Socioeconomic History  . Marital status: Married    Spouse name: Not on file  . Number of children: 1  . Years of education: Not on file  . Highest education level: Not on file  Occupational History  . Not on file  Social Needs  . Financial resource strain: Not hard at all  . Food insecurity    Worry: Never true    Inability: Never true  . Transportation needs    Medical: No    Non-medical: No  Tobacco Use  . Smoking status: Former Smoker    Packs/day: 1.00    Years: 40.00    Pack years: 40.00    Types: Cigarettes    Quit date: 06/26/1982    Years since quitting: 36.5  . Smokeless tobacco: Never Used  Substance and Sexual Activity  . Alcohol use: No  . Drug use: No  . Sexual activity: Not on file  Lifestyle  . Physical activity    Days per week: 0 days  Minutes per session: 0 min  . Stress: Not at all  Relationships  . Social Herbalist on phone: Once a week    Gets together: Once a week    Attends religious service: More than 4 times per year    Active member of club or organization: Yes    Attends meetings of clubs or organizations: More than 4 times per year    Relationship status: Married  . Intimate partner violence    Fear of current or ex partner: No    Emotionally abused: No    Physically abused: No    Forced sexual activity: No  Other Topics Concern  . Not on file  Social History  Narrative  . Not on file    FAMILY HISTORY:  Family History  Problem Relation Age of Onset  . Heart failure Father 36  . Parkinsonism Mother 1  . Lung cancer Brother     CURRENT MEDICATIONS:  Outpatient Encounter Medications as of 01/09/2019  Medication Sig  . aspirin EC 81 MG EC tablet Take 1 tablet (81 mg total) by mouth daily.  Marland Kitchen atorvastatin (LIPITOR) 80 MG tablet Take 1 tablet (80 mg total) by mouth daily.  . carvedilol (COREG) 3.125 MG tablet Take 1 tablet (3.125 mg total) by mouth 2 (two) times daily with a meal.  . clopidogrel (PLAVIX) 75 MG tablet Take 1 tablet (75 mg total) by mouth daily.  . finasteride (PROSCAR) 5 MG tablet Take 5 mg by mouth daily.  . furosemide (LASIX) 20 MG tablet Take 1 tablet (20 mg total) by mouth every other day.  . levothyroxine (SYNTHROID, LEVOTHROID) 50 MCG tablet Take 50 mcg by mouth daily.   . nitroGLYCERIN (NITROSTAT) 0.4 MG SL tablet Place 1 tablet (0.4 mg total) under the tongue every 5 (five) minutes x 3 doses as needed for chest pain.  . pantoprazole (PROTONIX) 40 MG tablet Take 40 mg by mouth 2 (two) times daily.  . tamsulosin (FLOMAX) 0.4 MG CAPS capsule Take 0.4 mg by mouth daily.    No facility-administered encounter medications on file as of 01/09/2019.     ALLERGIES:  No Known Allergies   PHYSICAL EXAM:  ECOG Performance status: 1  Vitals:   01/09/19 1423  BP: (!) 129/55  Pulse: 68  Resp: 14  Temp: (!) 97.1 F (36.2 C)  SpO2: 98%   Filed Weights   01/09/19 1423  Weight: 143 lb 8 oz (65.1 kg)    Physical Exam Constitutional:      Appearance: Normal appearance. He is normal weight.  Cardiovascular:     Rate and Rhythm: Normal rate and regular rhythm.     Heart sounds: Normal heart sounds.  Pulmonary:     Effort: Pulmonary effort is normal.     Breath sounds: Normal breath sounds.  Abdominal:     General: Bowel sounds are normal.     Palpations: Abdomen is soft.  Musculoskeletal: Normal range of motion.   Skin:    General: Skin is warm and dry.  Neurological:     Mental Status: He is alert and oriented to person, place, and time. Mental status is at baseline.  Psychiatric:        Mood and Affect: Mood normal.        Behavior: Behavior normal.        Thought Content: Thought content normal.        Judgment: Judgment normal.      LABORATORY DATA:  I  have reviewed the labs as listed.  CBC    Component Value Date/Time   WBC 6.4 01/09/2019 1347   RBC 3.28 (L) 01/09/2019 1347   HGB 10.0 (L) 01/09/2019 1347   HCT 33.8 (L) 01/09/2019 1347   HCT 25.3 (L) 04/01/2018 1446   PLT 256 01/09/2019 1347   MCV 103.0 (H) 01/09/2019 1347   MCH 30.5 01/09/2019 1347   MCHC 29.6 (L) 01/09/2019 1347   RDW 17.6 (H) 01/09/2019 1347   LYMPHSABS 0.5 (L) 01/09/2019 1347   MONOABS 0.6 01/09/2019 1347   EOSABS 0.0 01/09/2019 1347   BASOSABS 0.0 01/09/2019 1347   CMP Latest Ref Rng & Units 01/02/2019 09/30/2018 04/05/2018  Glucose 70 - 99 mg/dL 161(H) 102(H) 115(H)  BUN 8 - 23 mg/dL 66(H) 51(H) 64(H)  Creatinine 0.61 - 1.24 mg/dL 4.68(H) 3.05(H) 2.28(H)  Sodium 135 - 145 mmol/L 138 139 140  Potassium 3.5 - 5.1 mmol/L 5.3(H) 5.1 4.5  Chloride 98 - 111 mmol/L 109 111 105  CO2 22 - 32 mmol/L 19(L) 20(L) 24  Calcium 8.9 - 10.3 mg/dL 8.2(L) 8.6(L) 8.7(L)  Total Protein 6.5 - 8.1 g/dL 6.0(L) 7.3 -  Total Bilirubin 0.3 - 1.2 mg/dL 0.6 0.4 -  Alkaline Phos 38 - 126 U/L 61 68 -  AST 15 - 41 U/L 16 21 -  ALT 0 - 44 U/L 14 24 -      I personally performed a face-to-face visit.  All questions were answered to patient's stated satisfaction. Encouraged patient to call with any new concerns or questions before his next visit to the cancer center and we can certain see him sooner, if needed.     ASSESSMENT & PLAN:   MGUS (monoclonal gammopathy of unknown significance) 1. MGUS: - He was being worked up for CKD on 08/06/2018 showed M spike of 6.9% on 24-hour urine. -Skeletal survey on 10/12/2018 did not show any  lytic lesions. - Myeloma labs showed M spike of 0.8 g of IgG kappa protein.  Kappa light chains are elevated at 1451.  Lambda light chains are 22 and the ratio was 63.96. -Bone marrow biopsy on 11/26/2018 showed hypercellular marrow for age with 12% plasma cells.  Background showed trilineage hematopoiesis with relative maintenance of erythroid precursors.  Significant dyspoiesis or increase in blastic cells is not identified. - Multiple myeloma FISH panel showed gain in chromosome 1 q. (Poor risk indicator) and 13 q.  Deletion/monosomy 13 (standard risk indicator). -PET scan has been ordered for 01/13/2019. - She will follow-up with Dr. Delton Coombes after her PET scan.  2.  Macrocytic anemia: -This is from combination of CKD and MGUS. - As he was requiring blood transfusions, Procrit 20,000 units every 2 weeks which was started on 10/16/2018.  It was then increased to 30,000 units every week on 11/14/2018. - Labs on 01/02/2019 showed his globin 8.6. -He received 2 iron infusions 1 on 01/01/2019 and 1 on 01/08/2019. -Labs on 01/09/2019 has showed an increased response in his hemoglobin it is now 10.0. -She will continue weekly Procrit injections at 30,000 units. - We will see him back in 1 week after his PET scan.       Orders placed this encounter:  Orders Placed This Encounter  Procedures  . CBC with Differential/Platelet  . Comprehensive metabolic panel      Francene Finders, FNP-C Randy Fitzpatrick [phone]

## 2019-01-13 ENCOUNTER — Ambulatory Visit (HOSPITAL_COMMUNITY)
Admission: RE | Admit: 2019-01-13 | Discharge: 2019-01-13 | Disposition: A | Discharge status: Home / Self Care | Payer: Medicare Other | Source: Ambulatory Visit | Attending: Hematology | Admitting: Hematology

## 2019-01-13 ENCOUNTER — Other Ambulatory Visit: Payer: Self-pay

## 2019-01-13 DIAGNOSIS — C9000 Multiple myeloma not having achieved remission: Secondary | ICD-10-CM | POA: Diagnosis present

## 2019-01-13 MED ORDER — FLUDEOXYGLUCOSE F - 18 (FDG) INJECTION
9.3400 | Freq: Once | INTRAVENOUS | Status: AC | PRN
  Administered 2019-01-13: 9.34 via INTRAVENOUS

## 2019-01-16 ENCOUNTER — Inpatient Hospital Stay (HOSPITAL_COMMUNITY): Payer: Medicare Other

## 2019-01-16 ENCOUNTER — Encounter (HOSPITAL_COMMUNITY): Payer: Self-pay | Admitting: Hematology

## 2019-01-16 ENCOUNTER — Inpatient Hospital Stay (HOSPITAL_BASED_OUTPATIENT_CLINIC_OR_DEPARTMENT_OTHER): Payer: Medicare Other | Admitting: Hematology

## 2019-01-16 ENCOUNTER — Other Ambulatory Visit: Payer: Self-pay

## 2019-01-16 DIAGNOSIS — D631 Anemia in chronic kidney disease: Secondary | ICD-10-CM | POA: Diagnosis not present

## 2019-01-16 DIAGNOSIS — Z7189 Other specified counseling: Secondary | ICD-10-CM | POA: Insufficient documentation

## 2019-01-16 DIAGNOSIS — E039 Hypothyroidism, unspecified: Secondary | ICD-10-CM

## 2019-01-16 DIAGNOSIS — N189 Chronic kidney disease, unspecified: Secondary | ICD-10-CM

## 2019-01-16 DIAGNOSIS — D509 Iron deficiency anemia, unspecified: Secondary | ICD-10-CM | POA: Diagnosis not present

## 2019-01-16 DIAGNOSIS — C9000 Multiple myeloma not having achieved remission: Secondary | ICD-10-CM | POA: Insufficient documentation

## 2019-01-16 DIAGNOSIS — E782 Mixed hyperlipidemia: Secondary | ICD-10-CM

## 2019-01-16 DIAGNOSIS — D649 Anemia, unspecified: Secondary | ICD-10-CM

## 2019-01-16 DIAGNOSIS — Z87891 Personal history of nicotine dependence: Secondary | ICD-10-CM

## 2019-01-16 DIAGNOSIS — D472 Monoclonal gammopathy: Secondary | ICD-10-CM

## 2019-01-16 DIAGNOSIS — E1122 Type 2 diabetes mellitus with diabetic chronic kidney disease: Secondary | ICD-10-CM

## 2019-01-16 LAB — CBC WITH DIFFERENTIAL/PLATELET
Abs Immature Granulocytes: 0.03 10*3/uL (ref 0.00–0.07)
Basophils Absolute: 0 10*3/uL (ref 0.0–0.1)
Basophils Relative: 0 %
Eosinophils Absolute: 0.1 10*3/uL (ref 0.0–0.5)
Eosinophils Relative: 1 %
HCT: 35.5 % — ABNORMAL LOW (ref 39.0–52.0)
Hemoglobin: 10.5 g/dL — ABNORMAL LOW (ref 13.0–17.0)
Immature Granulocytes: 0 %
Lymphocytes Relative: 7 %
Lymphs Abs: 0.5 10*3/uL — ABNORMAL LOW (ref 0.7–4.0)
MCH: 29.9 pg (ref 26.0–34.0)
MCHC: 29.6 g/dL — ABNORMAL LOW (ref 30.0–36.0)
MCV: 101.1 fL — ABNORMAL HIGH (ref 80.0–100.0)
Monocytes Absolute: 0.6 10*3/uL (ref 0.1–1.0)
Monocytes Relative: 9 %
Neutro Abs: 5.9 10*3/uL (ref 1.7–7.7)
Neutrophils Relative %: 83 %
Platelets: 230 10*3/uL (ref 150–400)
RBC: 3.51 MIL/uL — ABNORMAL LOW (ref 4.22–5.81)
RDW: 18.5 % — ABNORMAL HIGH (ref 11.5–15.5)
WBC: 7.1 10*3/uL (ref 4.0–10.5)
nRBC: 0 % (ref 0.0–0.2)

## 2019-01-16 LAB — COMPREHENSIVE METABOLIC PANEL
ALT: 12 U/L (ref 0–44)
AST: 14 U/L — ABNORMAL LOW (ref 15–41)
Albumin: 2.7 g/dL — ABNORMAL LOW (ref 3.5–5.0)
Alkaline Phosphatase: 66 U/L (ref 38–126)
Anion gap: 12 (ref 5–15)
BUN: 75 mg/dL — ABNORMAL HIGH (ref 8–23)
CO2: 18 mmol/L — ABNORMAL LOW (ref 22–32)
Calcium: 8.2 mg/dL — ABNORMAL LOW (ref 8.9–10.3)
Chloride: 106 mmol/L (ref 98–111)
Creatinine, Ser: 5.36 mg/dL — ABNORMAL HIGH (ref 0.61–1.24)
GFR calc Af Amer: 10 mL/min — ABNORMAL LOW (ref >60)
GFR calc non Af Amer: 9 mL/min — ABNORMAL LOW (ref >60)
Glucose, Bld: 123 mg/dL — ABNORMAL HIGH (ref 70–99)
Potassium: 4.2 mmol/L (ref 3.5–5.1)
Sodium: 136 mmol/L (ref 135–145)
Total Bilirubin: 0.6 mg/dL (ref 0.3–1.2)
Total Protein: 6.7 g/dL (ref 6.5–8.1)

## 2019-01-16 LAB — VITAMIN B12: Vitamin B-12: 1106 pg/mL — ABNORMAL HIGH (ref 180–914)

## 2019-01-16 MED ORDER — EPOETIN ALFA 10000 UNIT/ML IJ SOLN
10000.0000 [IU] | Freq: Once | INTRAMUSCULAR | Status: AC
  Administered 2019-01-16: 10000 [IU] via SUBCUTANEOUS
  Filled 2019-01-16: qty 1

## 2019-01-16 MED ORDER — EPOETIN ALFA 20000 UNIT/ML IJ SOLN
20000.0000 [IU] | Freq: Once | INTRAMUSCULAR | Status: AC
  Administered 2019-01-16: 20000 [IU] via SUBCUTANEOUS
  Filled 2019-01-16: qty 1

## 2019-01-16 NOTE — Assessment & Plan Note (Signed)
1.  IgG kappa multiple myeloma: -M spike 0.8 g/dL, kappa light chains elevated at 1451, lambda light chains 22, ratio 63.96 -Bone marrow biopsy on 11/26/2018 shows hypercellular marrow for age with 12% plasma cells. -Myeloma FISH panel showed gain of 1 q. (poor risk factor) and 13 q. deletion/monosomy 13 (standard risk factor). -Cytogenetics 45, X, minus Y[17]/46 XY[3] - PET scan on 01/13/2019 showed bony destruction in the right transverse process of C1 extending into the vertebral body, new lytic lesion in the C7 spinous process with some extraosseous extension.  Lytic left iliac bone lesion measuring 2.7 x 2.0.  Lytic lesion in the right greater trochanter area.  There is a smoothly marginated 2.1 x 1.9 cm right upper lobe pulmonary nodule is moderately hypermetabolic with SUV of 5.0.  Given the smooth margins this could be a concomitant carcinoid tumor or low-grade malignancy. -Patient smoked 40 pack years, quit in 1984. - I have also recommended biopsy of the left iliac bone lesion to confirm myeloma especially given the lung finding. -We will arrange it through interventional radiology. -We also talked about starting him on weekly Velcade with dexamethasone.  We cannot use Revlimid because of his kidney dysfunction.  We will probably start Velcade at 1 mg per metered square and increase the dose as tolerated.  We will also start dexamethasone 10 mg weekly dose. -We will also start him on acyclovir for shingles prophylaxis, renal dosing. -I will reevaluate him in 1 week.  2.  Macrocytic anemia: -This is from combination of CKD, and plasma cell disorder.  Bone marrow biopsy did not show any dysplasia. - Last Feraheme was on 01/08/2019. -Hemoglobin today improved to 10.5.  He is currently on Procrit 30,000 units weekly. - I will hold off on his next treatment next week.

## 2019-01-16 NOTE — Progress Notes (Signed)
Randy Fitzpatrick. presents today for Procrit injection. Hemoglobin reviewed prior to administration. VSS. Injection tolerated without incident or complaint. See MAR for details. Patient discharged in satisfactory condition with follow up instructions.

## 2019-01-16 NOTE — Patient Instructions (Addendum)
[place] at [facility] Discharge Instructions  You were seen today by Dr. Delton Coombes. He went over your recent lab, biopsy and scan results. He would like to start you on Velcade injections, Xgeva injections and Dexamethasone to help control his cancer. He will see you back in 1 week for labs and follow up.   Thank you for choosing [facility] at [facility] to provide your oncology and hematology care.  To afford each patient quality time with our provider, please arrive at least 15 minutes before your scheduled appointment time.   If you have a lab appointment with the [facility] please come in thru the  Main Entrance and check in at the main information desk  You need to re-schedule your appointment should you arrive 10 or more minutes late.  We strive to give you quality time with our providers, and arriving late affects you and other patients whose appointments are after yours.  Also, if you no show three or more times for appointments you may be dismissed from the clinic at the providers discretion.     Again, thank you for choosing [facility].  Our hope is that these requests will decrease the amount of time that you wait before being seen by our physicians.       _____________________________________________________________  Should you have questions after your visit to [facility], please contact our office at [phone] between the hours of 8:00 a.m. and 4:30 p.m.  Voicemails left after 4:00 p.m. will not be returned until the following business day.  For prescription refill requests, have your pharmacy contact our office and allow 72 hours.    Cancer Center Support Programs:   > Cancer Support Group  2nd Tuesday of the month 1pm-2pm, Journey Room

## 2019-01-16 NOTE — Progress Notes (Signed)
[place], [place] [address]   CLINIC:  Medical Oncology/Hematology  PCP:  [place] [id number] [phone]   REASON FOR VISIT: Multiple myeloma and anemia secondary to CKD.  CURRENT THERAPY: Procrit injections   INTERVAL HISTORY:  Mr. Chadderdon 85 y.o. male seen for follow-up with his daughter today.  He denies any bleeding per rectum or melena.  He complains of new onset bilateral hip pains.  He does not have any appetite and reported decreased energy levels.  He is taking Lasix 20 mg daily as needed for lower extremity swelling.  He is also on aspirin 81 mg daily along with Plavix.  Denies any bleeding per rectum or melena.  Has some easy bruising.   REVIEW OF SYSTEMS:  Review of Systems  Constitutional: Positive for fatigue.  Hematological: Bruises/bleeds easily.  All other systems reviewed and are negative.    PAST MEDICAL/SURGICAL HISTORY:  Past Medical History:  Diagnosis Date  . Acute ST elevation myocardial infarction (STEMI) of inferior wall (HCC) 11/03/2017   PCI/DESx3 to RCA after wire dissection. EF 45-50%  . BPH (benign prostatic hyperplasia)   . CAD (coronary artery disease)    Multivessel/left main disease  . Hypothyroidism   . Mitral regurgitation   . Mixed hyperlipidemia   . Type 2 diabetes mellitus ([place])    Past Surgical History:  Procedure Laterality Date  . CORONARY STENT INTERVENTION N/A 11/03/2017   Procedure: CORONARY STENT INTERVENTION;  Surgeon: Lorretta Harp, MD;  Location: [facility];  Service: Cardiovascular;  Laterality: N/A;  . CORONARY/GRAFT ACUTE MI REVASCULARIZATION N/A 11/03/2017   Procedure: Coronary/Graft Acute MI Revascularization;  Surgeon: Lorretta Harp, MD;  Location: [facility];  Service: Cardiovascular;  Laterality: N/A;  . LEFT HEART CATH AND CORONARY ANGIOGRAPHY N/A 11/03/2017   Procedure: LEFT HEART CATH AND CORONARY  ANGIOGRAPHY;  Surgeon: Lorretta Harp, MD;  Location: [facility];  Service: Cardiovascular;  Laterality: N/A;  . STOMACH SURGERY     Treatment of an abscess     SOCIAL HISTORY:  Social History   Socioeconomic History  . Marital status: Married    Spouse name: Not on file  . Number of children: 1  . Years of education: Not on file  . Highest education level: Not on file  Occupational History  . Not on file  Social Needs  . Financial resource strain: Not hard at all  . Food insecurity    Worry: Never true    Inability: Never true  . Transportation needs    Medical: No    Non-medical: No  Tobacco Use  . Smoking status: Former Smoker    Packs/day: 1.00    Years: 40.00    Pack years: 40.00    Types: Cigarettes    Quit date: 06/26/1982    Years since quitting: 36.5  . Smokeless tobacco: Never Used  Substance and Sexual Activity  . Alcohol use: No  . Drug use: No  . Sexual activity: Not on file  Lifestyle  . Physical activity    Days per week: 0 days    Minutes per session: 0 min  . Stress: Not at all  Relationships  . Social connections    Talks on phone: Once a week    Gets together: Once a week    Attends religious service: More than 4 times per year    Active member of  club or organization: Yes    Attends meetings of clubs or organizations: More than 4 times per year    Relationship status: Married  . Intimate partner violence    Fear of current or ex partner: No    Emotionally abused: No    Physically abused: No    Forced sexual activity: No  Other Topics Concern  . Not on file  Social History Narrative  . Not on file    FAMILY HISTORY:  Family History  Problem Relation Age of Onset  . Heart failure Father 27  . Parkinsonism Mother 104  . Lung cancer Brother     CURRENT MEDICATIONS:  Outpatient Encounter Medications as of 01/16/2019  Medication Sig  . aspirin EC 81 MG EC tablet Take 1 tablet (81 mg total) by mouth daily.  Marland Kitchen atorvastatin  (LIPITOR) 80 MG tablet Take 1 tablet (80 mg total) by mouth daily.  . carvedilol (COREG) 3.125 MG tablet Take 1 tablet (3.125 mg total) by mouth 2 (two) times daily with a meal.  . clopidogrel (PLAVIX) 75 MG tablet Take 1 tablet (75 mg total) by mouth daily.  . finasteride (PROSCAR) 5 MG tablet Take 5 mg by mouth daily.  . furosemide (LASIX) 20 MG tablet Take 1 tablet (20 mg total) by mouth every other day. (Patient taking differently: Take 20 mg by mouth daily. )  . levothyroxine (SYNTHROID, LEVOTHROID) 50 MCG tablet Take 50 mcg by mouth daily.   . nitroGLYCERIN (NITROSTAT) 0.4 MG SL tablet Place 1 tablet (0.4 mg total) under the tongue every 5 (five) minutes x 3 doses as needed for chest pain.  . pantoprazole (PROTONIX) 40 MG tablet Take 40 mg by mouth 2 (two) times daily.  . tamsulosin (FLOMAX) 0.4 MG CAPS capsule Take 0.4 mg by mouth daily.    No facility-administered encounter medications on file as of 01/16/2019.     ALLERGIES:  No Known Allergies   PHYSICAL EXAM:  ECOG Performance status: 1  Vitals:   01/16/19 1127  BP: (!) 120/51  Pulse: 64  Resp: 20  Temp: (!) 97.1 F (36.2 C)  SpO2: 100%   Filed Weights   01/16/19 1127  Weight: 141 lb 8 oz (64.2 kg)    Physical Exam Constitutional:      Appearance: Normal appearance. He is normal weight.  Cardiovascular:     Rate and Rhythm: Normal rate and regular rhythm.     Heart sounds: Normal heart sounds.  Pulmonary:     Effort: Pulmonary effort is normal.     Breath sounds: Normal breath sounds.  Abdominal:     General: Bowel sounds are normal.     Palpations: Abdomen is soft.  Musculoskeletal: Normal range of motion.  Skin:    General: Skin is warm and dry.  Neurological:     Mental Status: He is alert and oriented to person, place, and time. Mental status is at baseline.  Psychiatric:        Mood and Affect: Mood normal.        Behavior: Behavior normal.        Thought Content: Thought content normal.         Judgment: Judgment normal.      LABORATORY DATA:  I have reviewed the labs as listed.  CBC    Component Value Date/Time   WBC 7.1 01/16/2019 1058   RBC 3.51 (L) 01/16/2019 1058   HGB 10.5 (L) 01/16/2019 1058   HCT 35.5 (L) 01/16/2019 1058  HCT 25.3 (L) 04/01/2018 1446   PLT 230 01/16/2019 1058   MCV 101.1 (H) 01/16/2019 1058   MCH 29.9 01/16/2019 1058   MCHC 29.6 (L) 01/16/2019 1058   RDW 18.5 (H) 01/16/2019 1058   LYMPHSABS 0.5 (L) 01/16/2019 1058   MONOABS 0.6 01/16/2019 1058   EOSABS 0.1 01/16/2019 1058   BASOSABS 0.0 01/16/2019 1058   CMP Latest Ref Rng & Units 01/16/2019 01/02/2019 09/30/2018  Glucose 70 - 99 mg/dL 123(H) 161(H) 102(H)  BUN 8 - 23 mg/dL 75(H) 66(H) 51(H)  Creatinine 0.61 - 1.24 mg/dL 5.36(H) 4.68(H) 3.05(H)  Sodium 135 - 145 mmol/L 136 138 139  Potassium 3.5 - 5.1 mmol/L 4.2 5.3(H) 5.1  Chloride 98 - 111 mmol/L 106 109 111  CO2 22 - 32 mmol/L 18(L) 19(L) 20(L)  Calcium 8.9 - 10.3 mg/dL 8.2(L) 8.2(L) 8.6(L)  Total Protein 6.5 - 8.1 g/dL 6.7 6.0(L) 7.3  Total Bilirubin 0.3 - 1.2 mg/dL 0.6 0.6 0.4  Alkaline Phos 38 - 126 U/L 66 61 68  AST 15 - 41 U/L 14(L) 16 21  ALT 0 - 44 U/L '12 14 24      ' I personally performed a face-to-face visit.  All questions were answered to patient's stated satisfaction. Encouraged patient to call with any new concerns or questions before his next visit to the cancer center and we can certain see him sooner, if needed.     ASSESSMENT & PLAN:   Multiple myeloma (HCC) 1.  IgG kappa multiple myeloma: -M spike 0.8 g/dL, kappa light chains elevated at 1451, lambda light chains 22, ratio 63.96 -Bone marrow biopsy on 11/26/2018 shows hypercellular marrow for age with 12% plasma cells. -Myeloma FISH panel showed gain of 1 q. (poor risk factor) and 13 q. deletion/monosomy 13 (standard risk factor). -Cytogenetics 45, X, minus Y[17]/46 XY[3] - PET scan on 01/13/2019 showed bony destruction in the right transverse process of C1  extending into the vertebral body, new lytic lesion in the C7 spinous process with some extraosseous extension.  Lytic left iliac bone lesion measuring 2.7 x 2.0.  Lytic lesion in the right greater trochanter area.  There is a smoothly marginated 2.1 x 1.9 cm right upper lobe pulmonary nodule is moderately hypermetabolic with SUV of 5.0.  Given the smooth margins this could be a concomitant carcinoid tumor or low-grade malignancy. -Patient smoked 40 pack years, quit in 1984. - I have also recommended biopsy of the left iliac bone lesion to confirm myeloma especially given the lung finding. -We will arrange it through interventional radiology. -We also talked about starting him on weekly Velcade with dexamethasone.  We cannot use Revlimid because of his kidney dysfunction.  We will probably start Velcade at 1 mg per metered square and increase the dose as tolerated.  We will also start dexamethasone 10 mg weekly dose. -We will also start him on acyclovir for shingles prophylaxis, renal dosing. -I will reevaluate him in 1 week.  2.  Macrocytic anemia: -This is from combination of CKD, and plasma cell disorder.  Bone marrow biopsy did not show any dysplasia. - Last Feraheme was on 01/08/2019. -Hemoglobin today improved to 10.5.  He is currently on Procrit 30,000 units weekly. - I will hold off on his next treatment next week.       Total time spent is 40 minutes with more than 50% of the time spent face-to-face discussing biopsy results, further plan, counseling and coordination of care.    Orders placed this encounter:  Orders Placed This Encounter  Procedures  . CT GUIDED NEEDLE PLACEMENT  . CT Biopsy      Francene Finders, FNP-C [place] [phone]

## 2019-01-16 NOTE — Patient Instructions (Signed)
[place] at [facility]  Discharge Instructions:  Procrit injection received today.  Epoetin Alfa injection What is this medicine? EPOETIN ALFA (e POE e tin AL fa) helps your body make more red blood cells. This medicine is used to treat anemia caused by chronic kidney disease, cancer chemotherapy, or HIV-therapy. It may also be used before surgery if you have anemia. This medicine may be used for other purposes; ask your health care provider or pharmacist if you have questions. COMMON BRAND NAME(S): Epogen, Procrit, Retacrit What should I tell my health care provider before I take this medicine? They need to know if you have any of these conditions:  cancer  heart disease  high blood pressure  history of blood clots  history of stroke  low levels of folate, iron, or vitamin B12 in the blood  seizures  an unusual or allergic reaction to erythropoietin, albumin, benzyl alcohol, hamster proteins, other medicines, foods, dyes, or preservatives  pregnant or trying to get pregnant  breast-feeding How should I use this medicine? This medicine is for injection into a vein or under the skin. It is usually given by a health care professional in a hospital or clinic setting. If you get this medicine at home, you will be taught how to prepare and give this medicine. Use exactly as directed. Take your medicine at regular intervals. Do not take your medicine more often than directed. It is important that you put your used needles and syringes in a special sharps container. Do not put them in a trash can. If you do not have a sharps container, call your pharmacist or healthcare provider to get one. A special MedGuide will be given to you by the pharmacist with each prescription and refill. Be sure to read this information carefully each time. Talk to your pediatrician regarding the use of this medicine in children. While this drug may be prescribed for selected  conditions, precautions do apply. Overdosage: If you think you have taken too much of this medicine contact a poison control center or emergency room at once. NOTE: This medicine is only for you. Do not share this medicine with others. What if I miss a dose? If you miss a dose, take it as soon as you can. If it is almost time for your next dose, take only that dose. Do not take double or extra doses. What may interact with this medicine? Interactions have not been studied. This list may not describe all possible interactions. Give your health care provider a list of all the medicines, herbs, non-prescription drugs, or dietary supplements you use. Also tell them if you smoke, drink alcohol, or use illegal drugs. Some items may interact with your medicine. What should I watch for while using this medicine? Your condition will be monitored carefully while you are receiving this medicine. You may need blood work done while you are taking this medicine. This medicine may cause a decrease in vitamin B6. You should make sure that you get enough vitamin B6 while you are taking this medicine. Discuss the foods you eat and the vitamins you take with your health care professional. What side effects may I notice from receiving this medicine? Side effects that you should report to your doctor or health care professional as soon as possible:  allergic reactions like skin rash, itching or hives, swelling of the face, lips, or tongue  seizures  signs and symptoms of a blood clot such as breathing problems; changes in  vision; chest pain; severe, sudden headache; pain, swelling, warmth in the leg; trouble speaking; sudden numbness or weakness of the face, arm or leg  signs and symptoms of a stroke like changes in vision; confusion; trouble speaking or understanding; severe headaches; sudden numbness or weakness of the face, arm or leg; trouble walking; dizziness; loss of balance or coordination Side effects that  usually do not require medical attention (report to your doctor or health care professional if they continue or are bothersome):  chills  cough  dizziness  fever  headaches  joint pain  muscle cramps  muscle pain  nausea, vomiting  pain, redness, or irritation at site where injected This list may not describe all possible side effects. Call your doctor for medical advice about side effects. You may report side effects to FDA at 1-800-FDA-1088. Where should I keep my medicine? Keep out of the reach of children. Store in a refrigerator between 2 and 8 degrees C (36 and 46 degrees F). Do not freeze or shake. Throw away any unused portion if using a single-dose vial. Multi-dose vials can be kept in the refrigerator for up to 21 days after the initial dose. Throw away unused medicine. NOTE: This sheet is a summary. It may not cover all possible information. If you have questions about this medicine, talk to your doctor, pharmacist, or health care provider.  2020 Elsevier/Gold Standard (2017-01-19 08:35:19)  _______________________________________________________________  Thank you for choosing [place] at [facility] to provide your oncology and hematology care.  To afford each patient quality time with our providers, please arrive at least 15 minutes before your scheduled appointment.  You need to re-schedule your appointment if you arrive 10 or more minutes late.  We strive to give you quality time with our providers, and arriving late affects you and other patients whose appointments are after yours.  Also, if you no show three or more times for appointments you may be dismissed from the clinic.  Again, thank you for choosing [place] at [place] hope is that these requests will allow you access to exceptional care and in a timely manner. _______________________________________________________________  If you have questions  after your visit, please contact our office at [phone] between the hours of 8:30 a.m. and 5:00 p.m. Voicemails left after 4:30 p.m. will not be returned until the following business day. _______________________________________________________________  For prescription refill requests, have your pharmacy contact our office. _______________________________________________________________  Recommendations made by the consultant and any test results will be sent to your referring physician. _______________________________________________________________

## 2019-01-16 NOTE — Progress Notes (Signed)
START ON PATHWAY REGIMEN - Multiple Myeloma and Other Plasma Cell Dyscrasias     A cycle is every 28 days:     Cyclophosphamide      Dexamethasone      Bortezomib   **Always confirm dose/schedule in your pharmacy ordering system**  Patient Characteristics: Newly Diagnosed, Transplant Ineligible or Refused, Standard Risk R-ISS Staging: Unknown Disease Classification: Newly Diagnosed Is Patient Eligible for Transplant<= Transplant Ineligible or Refused Risk Status: Standard Risk Intent of Therapy: Non-Curative / Palliative Intent, Discussed with Patient

## 2019-01-17 ENCOUNTER — Encounter: Payer: Self-pay | Admitting: Student

## 2019-01-17 ENCOUNTER — Ambulatory Visit (INDEPENDENT_AMBULATORY_CARE_PROVIDER_SITE_OTHER): Payer: Medicare Other | Admitting: Student

## 2019-01-17 VITALS — BP 127/60 | HR 65 | Temp 97.3°F | Ht 70.0 in | Wt 143.0 lb

## 2019-01-17 DIAGNOSIS — I5042 Chronic combined systolic (congestive) and diastolic (congestive) heart failure: Secondary | ICD-10-CM | POA: Diagnosis not present

## 2019-01-17 DIAGNOSIS — I10 Essential (primary) hypertension: Secondary | ICD-10-CM | POA: Diagnosis not present

## 2019-01-17 DIAGNOSIS — I2510 Atherosclerotic heart disease of native coronary artery without angina pectoris: Secondary | ICD-10-CM | POA: Diagnosis not present

## 2019-01-17 DIAGNOSIS — I255 Ischemic cardiomyopathy: Secondary | ICD-10-CM | POA: Diagnosis not present

## 2019-01-17 DIAGNOSIS — R627 Adult failure to thrive: Secondary | ICD-10-CM

## 2019-01-17 DIAGNOSIS — E785 Hyperlipidemia, unspecified: Secondary | ICD-10-CM | POA: Diagnosis not present

## 2019-01-17 DIAGNOSIS — N185 Chronic kidney disease, stage 5: Secondary | ICD-10-CM

## 2019-01-17 LAB — VITAMIN D 25 HYDROXY (VIT D DEFICIENCY, FRACTURES): Vit D, 25-Hydroxy: 17.7 ng/mL — ABNORMAL LOW (ref 30.0–100.0)

## 2019-01-17 MED ORDER — FUROSEMIDE 20 MG PO TABS: 20.0000 mg | ORAL_TABLET | ORAL | 3 refills | Status: DC

## 2019-01-17 NOTE — Progress Notes (Addendum)
Cardiology Office Note    Date:  01/17/2019   ID:  Randy Fitzpatrick., DOB [birth year], MRN [id number]  PCP:  Randy Fitzpatrick, Randy Germany, MD  Cardiologist: [name], MD    Chief Complaint  Patient presents with   Follow-up    1 month visit    History of Present Illness:    Randy Fitzpatrick. is a 85 y.o. male with past medical history of CAD (s/p DES x3 to RCA in 10/2017), chronic combined systolic and diastolic CHF (EF 11% by echo in 2019, at 45% by repeat imaging in 11/2018), HTN, HLD, MGUS, and Stage 4 CKD who presents to the office today for 34-monthfollow-up.  He was last examined by myself on 12/18/2018 [facility]. He reported having not taken Lasix 10 days prior to his recent admission and was not taking this regularly at the time of his visit. Given his worsening orthopnea and lower extremity edema, Lasix was resumed at his previous dosing of 20 mg every other day and close follow-up with Nephrology was recommended as [facility]   He is followed by ematology and receives weekly Procrit injections.  He was recently evaluated by Dr. KDelton Coombesfollowing his recent PET scan which showed bony destruction in the right transverse process and a new lytic lesion in the C7 spinous process which was concerning for carcinoid tumor or low-grade malignancy. It was recommended that he have a biopsy to confirm if this was consistent with myeloma.  Lab work obtained on 01/16/2019 showed hemoglobin stable at 10.5 with platelets at 230.  Na+ was 136, K+ 4.2, and creatinine was elevated to 5.36 (previously 4.68 two weeks prior).   In talking with the patient today, he reports that he did increase his Lasix to daily dosing approximately 3 weeks ago with improvement in his lower extremity edema. Says that weight has overall been stable on his home scales. He has baseline 2 pillow orthopnea but  denies any PND, chest pain, or palpitations.  He reports overall feeling "down" as he is having to undergo a biopsy as outlined above. He is unsure if he would want to undergo treatments if it is confirmed he has multiple myeloma. He reports having a decreased appetite due to food not tasting like it used to. He previously tried to consume protein shakes but says these leave an aftertaste. He reports consuming less than 1 bottle of water a day and less than 1 cup of coffee.  [facility].   Past Medical History:  Diagnosis Date   Acute ST elevation myocardial infarction (STEMI) of inferior wall ([facility] 11/03/2017   PCI/DESx3 to RCA after wire dissection. EF 45-50%   BPH (benign prostatic hyperplasia)    CAD (coronary artery disease)    Multivessel/left main disease   Hypothyroidism    Mitral regurgitation    Mixed hyperlipidemia    Type 2 diabetes mellitus ([facility]     Past Surgical History:  Procedure Laterality Date   CORONARY STENT INTERVENTION N/A 11/03/2017   Procedure: CORONARY STENT INTERVENTION;  Surgeon: BLorretta Harp MD;  [facility];  Service: Cardiovascular;  Laterality: N/A;   CORONARY/GRAFT ACUTE MI REVASCULARIZATION N/A 11/03/2017   Procedure: Coronary/Graft Acute MI Revascularization;  Surgeon: BLorretta Harp MD;  [facility];  Service:  Cardiovascular;  Laterality: N/A;   LEFT HEART CATH AND CORONARY ANGIOGRAPHY N/A 11/03/2017   Procedure: LEFT HEART CATH AND CORONARY ANGIOGRAPHY;  Surgeon: Lorretta Harp, MD;  Location: [place] [facility];  Service: Cardiovascular;  Laterality: N/A;   STOMACH SURGERY     Treatment of an abscess    Current Medications: Outpatient Medications Prior to Visit  Medication Sig Dispense Refill   aspirin EC 81 MG EC tablet Take 1 tablet (81 mg total) by mouth daily.     atorvastatin (LIPITOR) 80 MG tablet Take 1 tablet (80  mg total) by mouth daily. 30 tablet 6   carvedilol (COREG) 3.125 MG tablet Take 1 tablet (3.125 mg total) by mouth 2 (two) times daily with a meal. 60 tablet 6   clopidogrel (PLAVIX) 75 MG tablet Take 1 tablet (75 mg total) by mouth daily. 90 tablet 0   finasteride (PROSCAR) 5 MG tablet Take 5 mg by mouth daily.     levothyroxine (SYNTHROID, LEVOTHROID) 50 MCG tablet Take 50 mcg by mouth daily.      nitroGLYCERIN (NITROSTAT) 0.4 MG SL tablet Place 1 tablet (0.4 mg total) under the tongue every 5 (five) minutes x 3 doses as needed for chest pain. 25 tablet 1   pantoprazole (PROTONIX) 40 MG tablet Take 40 mg by mouth 2 (two) times daily.     tamsulosin (FLOMAX) 0.4 MG CAPS capsule Take 0.4 mg by mouth daily.      furosemide (LASIX) 20 MG tablet Take 1 tablet (20 mg total) by mouth every other day. (Patient taking differently: Take 20 mg by mouth daily. ) 45 tablet 3   No facility-administered medications prior to visit.      Allergies:   Patient has no known allergies.   Social History   Socioeconomic History   Marital status: Married    Spouse name: Not on file   Number of children: 1   Years of education: Not on file   Highest education level: Not on file  Occupational History   Not on file  Social Needs   Financial resource strain: Not hard at all   Food insecurity    Worry: Never true    Inability: Never true   Transportation needs    Medical: No    Non-medical: No  Tobacco Use   Smoking status: Former Smoker    Packs/day: 1.00    Years: 40.00    Pack years: 40.00    Types: Cigarettes    Quit date: 06/26/1982    Years since quitting: 36.5   Smokeless tobacco: Never Used  Substance and Sexual Activity   Alcohol use: No   Drug use: No   Sexual activity: Not on file  Lifestyle   Physical activity    Days per week: 0 days    Minutes per session: 0 min   Stress: Not at all  Relationships   Social connections    Talks on phone: Once a week     Gets together: Once a week    Attends religious service: More than 4 times per year    Active member of club or organization: Yes    Attends meetings of clubs or organizations: More than 4 times per year    Relationship status: Married  Other Topics Concern   Not on file  Social History Narrative   Not on file     Family History:  The patient's family history includes Heart failure (age of onset: 11) in his father;  Lung cancer in his brother; Parkinsonism (age of onset: 64) in his mother.   Review of Systems:   Please see the history of present illness.     General:  No chills, fever, or  night sweats. Positive for weight loss and fatigue.  Cardiovascular:  No chest pain, dyspnea on exertion, orthopnea, palpitations, paroxysmal nocturnal dyspnea. Positive for edema.  Dermatological: No rash, lesions/masses Respiratory: No cough, dyspnea Urologic: No hematuria, dysuria Abdominal:   No nausea, vomiting, diarrhea, bright red blood per rectum, melena, or hematemesis Neurologic:  No visual changes, wkns, changes in mental status. All other systems reviewed and are otherwise negative except as noted above.   Physical Exam:    VS:  BP 127/60    Pulse 65    Temp (!) 97.3 F (36.3 C) (Temporal)    Ht '5\' 10"'$  (1.778 m)    Wt 143 lb (64.9 kg)    BMI 20.52 kg/m    General: Thin elderly male appearing in no acute distress. Head: Normocephalic, atraumatic, sclera non-icteric, no xanthomas, nares are without discharge.  Neck: No carotid bruits. JVD not elevated.  Lungs: Respirations regular and unlabored, without wheezes or rales.  Heart: Regular rate and rhythm. No S3 or S4.  No murmur, no rubs, or gallops appreciated. Abdomen: Soft, non-tender, non-distended with normoactive bowel sounds. No hepatomegaly. No rebound/guarding. No obvious abdominal masses. Msk:  Strength and tone appear normal for age. No joint deformities or effusions. Extremities: No clubbing or cyanosis. 1+ pitting edema  bilaterally.  Distal pedal pulses are 2+ bilaterally. Neuro: Alert and oriented X 3. Moves all extremities spontaneously. No focal deficits noted. Psych:  Responds to questions appropriately with a normal affect. Skin: No rashes or lesions noted  Wt Readings from Last 3 Encounters:  01/17/19 143 lb (64.9 kg)  01/16/19 141 lb 8 oz (64.2 kg)  01/09/19 143 lb 8 oz (65.1 kg)     Studies/Labs Reviewed:   EKG:  EKG is not ordered today.   Recent Labs: 03/31/2018: B Natriuretic Peptide 650.5 01/16/2019: ALT 12; BUN 75; Creatinine, Ser 5.36; Hemoglobin 10.5; Platelets 230; Potassium 4.2; Sodium 136   Lipid Panel    Component Value Date/Time   CHOL 224 (H) 11/04/2017 0242   TRIG 263 (H) 11/04/2017 0242   HDL 32 (L) 11/04/2017 0242   CHOLHDL 7.0 11/04/2017 0242   VLDL 53 (H) 11/04/2017 0242   LDLCALC 139 (H) 11/04/2017 0242    Additional studies/ records that were reviewed today include:   Echocardiogram: 03/2018 Study Conclusions  - Left ventricle: The cavity size was normal. Wall thickness was   increased in a pattern of mild LVH. Akinesis of the inferior   wall. Inferolateral hypokinesis. The estimated ejection fraction   was 40%. Features are consistent with a pseudonormal left   ventricular filling pattern, with concomitant abnormal relaxation   and increased filling pressure (grade 2 diastolic dysfunction). - Aortic valve: There was trivial regurgitation. - Aorta: Mildly dilated ascending aorta, 3.7 cm. - Mitral valve: Moderately calcified annulus. Mildly calcified   leaflets . There was no evidence for stenosis. There was moderate   regurgitation, suspect infarct-related with   inferior/inferolateral wall motion abnormalities and restriction   of posterior leaflet. Mean gradient (D): 5 mm Hg. Valve area by   pressure half-time: 2.65 cm^2. - Left atrium: The atrium was mildly dilated. - Right ventricle: The cavity size was normal. Systolic function   was normal. - Right  atrium: The atrium was mildly dilated. -  Tricuspid valve: Peak RV-RA gradient (S): 44 mm Hg. - Pulmonary arteries: PA peak pressure: 52 mm Hg (S). - Systemic veins: IVC measured 2.1 cm with > 50% respirophasic   variation, suggesting RA pressure 8 mmHg. - Pericardium, extracardiac: Cystic structures noted in liver. A   trivial pericardial effusion was identified.  Impressions:  - Normal LV size with mild LV hypertrophy, EF 40%. Inferior   akinesis, inferolateral hypokinesis. Moderate diastolic   dysfunction. Normal RV size and systolic function. Moderate   mitral regurgitation, suspect infarct-related. Moderate pulmonary   hypertension.   Echocardiogram: 11/2018 Left ventricular hypertrophy - moderate  Mildly decreased left ventricular systolic function, ejection fraction  45%  Segmental wall motion abnormality - (inferior)  Diastolic dysfunction - grade II (elevated filling pressures)  Normal right ventricular systolic function  Elevated pulmonary artery systolic pressure - moderate  Mitral regurgitation - mild  Aortic sclerosis  Dilated right atrium - mild  Mildly elevated right atrial pressure  Assessment:    1. Chronic combined systolic and diastolic heart failure ([place])   2. Coronary artery disease involving native coronary artery of native heart without angina pectoris   3. Essential hypertension   4. Hyperlipidemia LDL goal <70   5. CKD (chronic kidney disease) stage 5, GFR less than 15 ml/min (HCC)   6. Failure to thrive in adult      Plan:   In order of problems listed above:  1. Chronic Combined Systolic and Diastolic CHF - he has a known reduced EF of 45% by repeat imaging in 11/2018. He continues to experience issues with lower extremity edema but this is complicated by his worsening creatinine as this was previously 2.5 - 3.0 but had trended upwards to 4.68 earlier this month and now at 5.36 on most recent check.  - he consumes less than 20 ounces  of fluid daily and given his worsening renal function, I recommended reducing his Lasix from '20mg'$  daily to '20mg'$  every other day. I encouraged him to weigh daily and take an additional tablet if weight increases by more than 2-3 lbs overnight or 5 lbs in one week. Urgent referral placed back to Nephrology given his advanced CKD.  - continue Coreg. Not on ACE-I/ARB/ARNI secondary to CKD.   2. CAD  - s/p DES x3 to RCA in 10/2017. He reports that his breathing has been at baseline and he denies any chest pain. No plans for further ischemic testing at this time. - Continue ASA, beta-blocker, Plavix, and statin therapy.  3. HTN - BP is well controlled at 127/60 during today's visit. Continue Coreg 3.125 mg twice daily.  4. HLD - Followed by his PCP. He remains on Atorvastatin 80 mg daily.  5. Stage 4 CKD - Creatinine was elevated to 5.36 when checked on 01/16/2019. He has not followed up with Nephrology in several months and I placed an urgent referral back to Dr. Lowanda Foster today.  6. Failure to Thrive - He reports consuming little food due to having a decreased appetite and only drinks a bottle of water per day. He has tried protein shakes but does not like these either. He is also undergoing an upcoming biopsy for multiple myeloma but tells me today he is not sure if he would want treatment. Will send a staff message to his Oncologist today. If not undergoing treatment and given his multiple co-morbidities, he would likely benefit from a Palliative Care consult to discuss goals of care.    Medication Adjustments/Labs and Tests Ordered:  Current medicines are reviewed at length with the patient today.  Concerns regarding medicines are outlined above.  Medication changes, Labs and Tests ordered today are listed in the Patient Instructions below. Patient Instructions  Medication Instructions:  Your physician recommends that you continue on your current medications as directed. Please refer to the  Current Medication list given to you today.  Take Lasix 20 mg Every Other Day   If you need a refill on your cardiac medications before your next appointment, please call your pharmacy.   Lab work: NONE   If you have labs (blood work) drawn today and your tests are completely normal, you will receive your results only by:  [place] (if you have MyChart) OR  A paper copy in the mail If you have any lab test that is abnormal or we need to change your treatment, we will call you to review the results.  Testing/Procedures: NONE   Follow-Up: At [facility], you and your health needs are our priority.  As part of our continuing mission to provide you with exceptional heart care, we have created designated Provider Care Teams.  These Care Teams include your primary Cardiologist (physician) and Advanced Practice Providers (APPs -  Physician Assistants and Nurse Practitioners) who all work together to provide you with the care you need, when you need it. You will need a follow up appointment as scheduled.  Please call our office 2 months in advance to schedule this appointment.  You may see [name], MD or one of the following Advanced Practice Providers on your designated Care Team:   Bernerd Pho, PA-C ([place])  Ermalinda Barrios, PA-C [facility])  Any Other Special Instructions Will Be Listed Below (If Applicable). You have been referred to Dr. Lowanda Foster   Thank you for choosing [place]!    Signed, Erma Heritage, PA-C  01/17/2019 4:44 PM    [place] [address] [place], [place] [address] Phone: [phone] Fax: [phone]

## 2019-01-17 NOTE — Patient Instructions (Signed)
Medication Instructions:  Your physician recommends that you continue on your current medications as directed. Please refer to the Current Medication list given to you today.  Take Lasix 20 mg Every Other Day   If you need a refill on your cardiac medications before your next appointment, please call your pharmacy.   Lab work: NONE   If you have labs (blood work) drawn today and your tests are completely normal, you will receive your results only by: [facility] MyChart Message (if you have MyChart) OR . A paper copy in the mail If you have any lab test that is abnormal or we need to change your treatment, we will call you to review the results.  Testing/Procedures: NONE   Follow-Up: At [facility], you and your health needs are our priority.  As part of our continuing mission to provide you with exceptional heart care, we have created designated Provider Care Teams.  These Care Teams include your primary Cardiologist (physician) and Advanced Practice Providers (APPs -  Physician Assistants and Nurse Practitioners) who all work together to provide you with the care you need, when you need it. You will need a follow up appointment as scheduled.  Please call our office 2 months in advance to schedule this appointment.  You may see [name], MD or one of the following Advanced Practice Providers on your designated Care Team:   Bernerd Pho, PA-C [facility]) . Ermalinda Barrios, PA-C ([place])  Any Other Special Instructions Will Be Listed Below (If Applicable). You have been referred to Dr. Lowanda Foster   Thank you for choosing [place]!

## 2019-01-20 ENCOUNTER — Telehealth (HOSPITAL_COMMUNITY): Payer: Self-pay

## 2019-01-21 ENCOUNTER — Encounter (HOSPITAL_COMMUNITY): Payer: Self-pay | Admitting: *Deleted

## 2019-01-21 NOTE — Progress Notes (Signed)
I spoke with Dr. Delton Coombes and it is okay for patient to come off his plavix for 7 days prior to his appointment for CT biopsy.  I have notified Morey Hummingbird in central scheduling.

## 2019-01-22 ENCOUNTER — Telehealth (HOSPITAL_COMMUNITY): Payer: Self-pay

## 2019-01-24 ENCOUNTER — Other Ambulatory Visit (HOSPITAL_COMMUNITY): Payer: Medicare Other

## 2019-01-24 ENCOUNTER — Ambulatory Visit (HOSPITAL_COMMUNITY): Payer: Medicare Other

## 2019-01-24 ENCOUNTER — Ambulatory Visit (HOSPITAL_COMMUNITY): Payer: Medicare Other | Admitting: Nurse Practitioner

## 2019-01-30 ENCOUNTER — Other Ambulatory Visit: Payer: Self-pay | Admitting: Radiology

## 2019-01-31 ENCOUNTER — Other Ambulatory Visit: Payer: Self-pay | Admitting: Radiology

## 2019-02-03 ENCOUNTER — Ambulatory Visit (HOSPITAL_COMMUNITY)
Admission: RE | Admit: 2019-02-03 | Discharge: 2019-02-03 | Disposition: A | Discharge status: Home / Self Care | Payer: Medicare Other | Source: Ambulatory Visit | Attending: Hematology | Admitting: Hematology

## 2019-02-03 ENCOUNTER — Other Ambulatory Visit: Payer: Self-pay

## 2019-02-03 ENCOUNTER — Encounter (HOSPITAL_COMMUNITY): Payer: Self-pay

## 2019-02-03 DIAGNOSIS — I340 Nonrheumatic mitral (valve) insufficiency: Secondary | ICD-10-CM | POA: Diagnosis not present

## 2019-02-03 DIAGNOSIS — E039 Hypothyroidism, unspecified: Secondary | ICD-10-CM | POA: Insufficient documentation

## 2019-02-03 DIAGNOSIS — I2510 Atherosclerotic heart disease of native coronary artery without angina pectoris: Secondary | ICD-10-CM | POA: Diagnosis not present

## 2019-02-03 DIAGNOSIS — Z87891 Personal history of nicotine dependence: Secondary | ICD-10-CM | POA: Insufficient documentation

## 2019-02-03 DIAGNOSIS — E782 Mixed hyperlipidemia: Secondary | ICD-10-CM | POA: Diagnosis not present

## 2019-02-03 DIAGNOSIS — Z79899 Other long term (current) drug therapy: Secondary | ICD-10-CM | POA: Diagnosis not present

## 2019-02-03 DIAGNOSIS — I252 Old myocardial infarction: Secondary | ICD-10-CM | POA: Diagnosis not present

## 2019-02-03 DIAGNOSIS — Z79890 Hormone replacement therapy (postmenopausal): Secondary | ICD-10-CM | POA: Diagnosis not present

## 2019-02-03 DIAGNOSIS — Z7982 Long term (current) use of aspirin: Secondary | ICD-10-CM | POA: Insufficient documentation

## 2019-02-03 DIAGNOSIS — Z7902 Long term (current) use of antithrombotics/antiplatelets: Secondary | ICD-10-CM | POA: Insufficient documentation

## 2019-02-03 DIAGNOSIS — N400 Enlarged prostate without lower urinary tract symptoms: Secondary | ICD-10-CM | POA: Insufficient documentation

## 2019-02-03 DIAGNOSIS — C9000 Multiple myeloma not having achieved remission: Secondary | ICD-10-CM | POA: Diagnosis present

## 2019-02-03 DIAGNOSIS — E119 Type 2 diabetes mellitus without complications: Secondary | ICD-10-CM | POA: Insufficient documentation

## 2019-02-03 DIAGNOSIS — Z801 Family history of malignant neoplasm of trachea, bronchus and lung: Secondary | ICD-10-CM | POA: Insufficient documentation

## 2019-02-03 DIAGNOSIS — Z8249 Family history of ischemic heart disease and other diseases of the circulatory system: Secondary | ICD-10-CM | POA: Diagnosis not present

## 2019-02-03 LAB — CBC
HCT: 37.2 % — ABNORMAL LOW (ref 39.0–52.0)
Hemoglobin: 10.6 g/dL — ABNORMAL LOW (ref 13.0–17.0)
MCH: 29 pg (ref 26.0–34.0)
MCHC: 28.5 g/dL — ABNORMAL LOW (ref 30.0–36.0)
MCV: 101.6 fL — ABNORMAL HIGH (ref 80.0–100.0)
Platelets: 238 10*3/uL (ref 150–400)
RBC: 3.66 MIL/uL — ABNORMAL LOW (ref 4.22–5.81)
RDW: 17.6 % — ABNORMAL HIGH (ref 11.5–15.5)
WBC: 6.8 10*3/uL (ref 4.0–10.5)
nRBC: 0 % (ref 0.0–0.2)

## 2019-02-03 LAB — PROTIME-INR
INR: 1.4 — ABNORMAL HIGH (ref 0.8–1.2)
Prothrombin Time: 17.1 seconds — ABNORMAL HIGH (ref 11.4–15.2)

## 2019-02-03 LAB — GLUCOSE, CAPILLARY: Glucose-Capillary: 83 mg/dL (ref 70–99)

## 2019-02-03 MED ORDER — SODIUM CHLORIDE 0.9 % IV SOLN: INTRAVENOUS | Status: DC

## 2019-02-03 MED ORDER — MIDAZOLAM HCL 2 MG/2ML IJ SOLN
INTRAMUSCULAR | Status: AC
  Filled 2019-02-03: qty 2

## 2019-02-03 MED ORDER — LIDOCAINE HCL 1 % IJ SOLN
INTRAMUSCULAR | Status: AC
  Filled 2019-02-03: qty 20

## 2019-02-03 MED ORDER — FENTANYL CITRATE (PF) 100 MCG/2ML IJ SOLN
INTRAMUSCULAR | Status: AC
  Filled 2019-02-03: qty 2

## 2019-02-03 NOTE — H&P (Signed)
Chief Complaint: Patient was seen in consultation today for left iliac bone lesion biopsy at the request of [place]  Referring Physician(s): Katragadda,Sreedhar  Supervising Physician: Markus Daft  Patient Status: [facility] - Out-pt  History of Present Illness: Randy Fitzpatrick. is a 85 y.o. male   Multiple myeloma Bone marrow bx 11/26/18: HYPERCELLULAR BONE MARROW FOR AGE WITH PLASMA CELL NEOPLASM. - TRILINEAGE HEMATOPOIESIS.  New PET 7/20 : IMPRESSION: 1. Hypermetabolic lytic osseous lesions in the right C1 transverse process; the C7 spinous process; the left iliac bone; and the right greater trochanter compatible with active myeloma. 2. Smoothly marginated 2.1 by 1.9 cm right upper lobe pulmonary nodule is moderately hypermetabolic with a maximum SUV of 5.0. Given the smooth margins this could be a concomitant carcinoid tumor or similar low-grade malignancy. 3. Two small lymph nodes are vessels along level V of the neck are faintly metabolic and may merit surveillance. Maximum SUV is about at background mediastinal level. Two foci of hypermetabolic activity in the sigmoid colon. One of these is associated with adjacent inflammatory findings and probably from diverticulitis. The other has no CT correlate may be physiologic/artifactual or due to occult polyp. There is also a small focus of hypermetabolic activity in the descending colon, with some adjacent fluid in the paracolic gutter, probably from inflammation. 4. Moderate to large left and small right pleural effusion. 5.  Aortic Atherosclerosis (ICD10-I70.0).  Coronary atherosclerosis. 6. Photopenic hepatic and renal cysts. One rim calcified lesion along the posterior right hepatic lobe is probably represents a complex collapsed cyst; back in 2015 there was a much larger cyst in this vicinity, and there is no current hypermetabolic activity.  Request for left iliac lesion biopsy to confirm MM - given lung  lesion per Dr Delton Coombes  LD Plavix 8 days ago  Past Medical History:  Diagnosis Date   Acute ST elevation myocardial infarction (STEMI) of inferior wall ([place]) 11/03/2017   PCI/DESx3 to RCA after wire dissection. EF 45-50%   BPH (benign prostatic hyperplasia)    CAD (coronary artery disease)    Multivessel/left main disease   Hypothyroidism    Mitral regurgitation    Mixed hyperlipidemia    Type 2 diabetes mellitus ([place])     Past Surgical History:  Procedure Laterality Date   CORONARY STENT INTERVENTION N/A 11/03/2017   Procedure: CORONARY STENT INTERVENTION;  Surgeon: Lorretta Harp, MD;  Location: [place];  Service: Cardiovascular;  Laterality: N/A;   CORONARY/GRAFT ACUTE MI REVASCULARIZATION N/A 11/03/2017   Procedure: Coronary/Graft Acute MI Revascularization;  Surgeon: Lorretta Harp, MD;  Location: [facility];  Service: Cardiovascular;  Laterality: N/A;   LEFT HEART CATH AND CORONARY ANGIOGRAPHY N/A 11/03/2017   Procedure: LEFT HEART CATH AND CORONARY ANGIOGRAPHY;  Surgeon: Lorretta Harp, MD;  Location: [facility];  Service: Cardiovascular;  Laterality: N/A;   STOMACH SURGERY     Treatment of an abscess    Allergies: Patient has no known allergies.  Medications: Prior to Admission medications   Medication Sig Start Date End Date Taking? Authorizing Provider  aspirin EC 81 MG EC tablet Take 1 tablet (81 mg total) by mouth daily. 11/09/17   Cheryln Manly, NP  atorvastatin (LIPITOR) 80 MG tablet Take 1 tablet (80 mg total) by mouth daily. 08/12/18   Satira Sark, MD  carvedilol (COREG) 3.125 MG tablet Take 1 tablet (3.125 mg total) by mouth 2 (two) times daily with a meal. 08/12/18  Satira Sark, MD  clopidogrel (PLAVIX) 75 MG tablet Take 1 tablet (75 mg total) by mouth daily. 10/30/18   Satira Sark, MD  finasteride (PROSCAR) 5 MG tablet Take 5 mg by mouth daily.    [provider]  furosemide (LASIX)  20 MG tablet Take 1 tablet (20 mg total) by mouth every other day. 01/17/19 04/17/19  Strader, Fransisco Hertz, PA-C  levothyroxine (SYNTHROID, LEVOTHROID) 50 MCG tablet Take 50 mcg by mouth daily.  02/29/12   [provider]  nitroGLYCERIN (NITROSTAT) 0.4 MG SL tablet Place 1 tablet (0.4 mg total) under the tongue every 5 (five) minutes x 3 doses as needed for chest pain. 11/08/17   Cheryln Manly, NP  pantoprazole (PROTONIX) 40 MG tablet Take 40 mg by mouth 2 (two) times daily.    [provider]  tamsulosin (FLOMAX) 0.4 MG CAPS capsule Take 0.4 mg by mouth daily.     [provider]     Family History  Problem Relation Age of Onset   Heart failure Father 50   Parkinsonism Mother 72   Lung cancer Brother     Social History   Socioeconomic History   Marital status: Married    Spouse name: Not on file   Number of children: 1   Years of education: Not on file   Highest education level: Not on file  Occupational History   Not on file  Social Needs   Financial resource strain: Not hard at all   Food insecurity    Worry: Never true    Inability: Never true   Transportation needs    Medical: No    Non-medical: No  Tobacco Use   Smoking status: Former Smoker    Packs/day: 1.00    Years: 40.00    Pack years: 40.00    Types: Cigarettes    Quit date: 06/26/1982    Years since quitting: 36.6   Smokeless tobacco: Never Used  Substance and Sexual Activity   Alcohol use: No   Drug use: No   Sexual activity: Not on file  Lifestyle   Physical activity    Days per week: 0 days    Minutes per session: 0 min   Stress: Not at all  Relationships   Social connections    Talks on phone: Once a week    Gets together: Once a week    Attends religious service: More than 4 times per year    Active member of club or organization: Yes    Attends meetings of clubs or organizations: More than 4 times per year    Relationship status: Married  Other  Topics Concern   Not on file  Social History Narrative   Not on file    Review of Systems: A 12 point ROS discussed and pertinent positives are indicated in the HPI above.  All other systems are negative.  Review of Systems  Constitutional: Positive for activity change, appetite change, fatigue and unexpected weight change. Negative for fever.  Respiratory: Negative for cough and shortness of breath.   Cardiovascular: Negative for chest pain.  Gastrointestinal: Negative for abdominal pain.  Neurological: Positive for weakness.  Psychiatric/Behavioral: Negative for behavioral problems and confusion.    Vital Signs: BP (!) 146/62    Pulse 80    Temp 98.2 F (36.8 C) (Skin)    Resp 18    Ht '5\' 10"'  (1.778 m)    Wt 140 lb (63.5 kg)    SpO2  100%    BMI 20.09 kg/m   Physical Exam Vitals signs reviewed.  Cardiovascular:     Rate and Rhythm: Normal rate and regular rhythm.     Heart sounds: Murmur present.  Pulmonary:     Effort: Pulmonary effort is normal.     Breath sounds: Normal breath sounds.  Abdominal:     Palpations: Abdomen is soft.  Musculoskeletal: Normal range of motion.  Skin:    General: Skin is warm and dry.  Neurological:     Mental Status: He is alert and oriented to person, place, and time.  Psychiatric:        Mood and Affect: Mood normal.        Behavior: Behavior normal.        Thought Content: Thought content normal.        Judgment: Judgment normal.     Comments: I Called Dtr [name]--- she is aware of left iliac bone lesion biopsy Agreeable to proceed     Imaging: Nm Pet Image Initial (pi) Skull Base To Thigh  Result Date: 01/14/2019 CLINICAL DATA:  Initial treatment strategy for multiple myeloma. EXAM: NUCLEAR MEDICINE PET SKULL BASE TO THIGH TECHNIQUE: 9.3 mCi F-18 FDG was injected intravenously. Full-ring PET imaging was performed from the skull base to thigh after the radiotracer. CT data was obtained and used for attenuation correction and  anatomic localization. Fasting blood glucose: 100 mg/dl COMPARISON:  Multiple exams, including 09/30/2018 bone survey, and CT abdomen from 01/05/2014 FINDINGS: Mediastinal blood pool activity: SUV max 2.3 Liver activity: SUV max NA NECK: Subtle left level V nodularity/vascularity noted, with one 5 mm lymph node having a maximum SUV of 2.5. A second small lateral lymph node is similar size has maximum SUV of 2.3. Incidental CT findings: Bilateral common carotid atherosclerotic calcification. CHEST: A smoothly marginated 2.1 by 1.9 cm right upper lobe pulmonary nodule on image 104 has a maximum SUV of 5.0. Incidental CT findings: Moderate to large left and small right pleural effusions. Biapical pleuroparenchymal scarring. Coronary, aortic arch, and branch vessel atherosclerotic vascular disease. A right lower paratracheal node anterior to the carina measures 0.9 cm in short axis on image 117/3, maximum SUV 2.2. ABDOMEN/PELVIS: Photopenic hepatic and renal cysts. Physiologic activity in the stomach and in the large bowel. Localized accentuated activity in the sigmoid colon, maximum SUV 10.0, adjacent stranding in the paracolic soft tissues, query active diverticulitis. Similar focal accentuated activity in the descending colon, maximum SUV 13.6, with adjacent fluid in the paracolic gutter. Focal activity distally in the sigmoid colon, maximum SUV 18.8, without CT correlate, possibly from an occult polyp or incidental physiologic activity. Incidental CT findings: Rim calcified 2.1 by 1.5 cm lesion along the inferior-posterior margin of the right hepatic lobe on image 201/3, without associated hypermetabolic activity. Aortoiliac atherosclerotic vascular disease. Prominent prostatomegaly. SKELETON: Compared to the CT cervical spine from 10/20/2016, there is new bony destruction the right transverse process of C1 partially extending into the vertebral body, maximum standard uptake value 10.5 compatible with active  myeloma. There is likewise a new lytic lesion the C7 spinous process probably with some extraosseous extension, maximum SUV 11.2, compatible with malignancy. The associated abnormal activity measures approximately 3.4 by 3.0 cm. A lytic left iliac bone lesion measuring approximately 2.7 by 2.0 cm on image 261/3 has a maximum SUV of 9.6. A lytic lesion anteriorly in the right greater trochanter area is difficult to measure but has a maximum SUV of 10.1. Incidental CT findings: none IMPRESSION:  1. Hypermetabolic lytic osseous lesions in the right C1 transverse process; the C7 spinous process; the left iliac bone; and the right greater trochanter compatible with active myeloma. 2. Smoothly marginated 2.1 by 1.9 cm right upper lobe pulmonary nodule is moderately hypermetabolic with a maximum SUV of 5.0. Given the smooth margins this could be a concomitant carcinoid tumor or similar low-grade malignancy. 3. Two small lymph nodes are vessels along level V of the neck are faintly metabolic and may merit surveillance. Maximum SUV is about at background mediastinal level. Two foci of hypermetabolic activity in the sigmoid colon. One of these is associated with adjacent inflammatory findings and probably from diverticulitis. The other has no CT correlate may be physiologic/artifactual or due to occult polyp. There is also a small focus of hypermetabolic activity in the descending colon, with some adjacent fluid in the paracolic gutter, probably from inflammation. 4. Moderate to large left and small right pleural effusion. 5.  Aortic Atherosclerosis (ICD10-I70.0).  Coronary atherosclerosis. 6. Photopenic hepatic and renal cysts. One rim calcified lesion along the posterior right hepatic lobe is probably represents a complex collapsed cyst; back in 2015 there was a much larger cyst in this vicinity, and there is no current hypermetabolic activity. Electronically Signed   By: Van Clines M.D.   On: 01/14/2019 10:53     Labs:  CBC: Recent Labs    12/26/18 1221 01/02/19 1106 01/09/19 1347 01/16/19 1058  WBC 5.0 4.5 6.4 7.1  HGB 8.8* 8.6* 10.0* 10.5*  HCT 30.2* 30.3* 33.8* 35.5*  PLT 248 295 256 230    COAGS: No results for input(s): INR, APTT in the last 8760 hours.  BMP: Recent Labs    04/05/18 0538 09/30/18 1509 01/02/19 1106 01/16/19 1058  NA 140 139 138 136  K 4.5 5.1 5.3* 4.2  CL 105 111 109 106  CO2 24 20* 19* 18*  GLUCOSE 115* 102* 161* 123*  BUN 64* 51* 66* 75*  CALCIUM 8.7* 8.6* 8.2* 8.2*  CREATININE 2.28* 3.05* 4.68* 5.36*  GFRNONAA 23* 17* 10* 9*  GFRAA 27* 20* 12* 10*    LIVER FUNCTION TESTS: Recent Labs    04/01/18 0442 09/30/18 1509 01/02/19 1106 01/16/19 1058  BILITOT 0.6 0.4 0.6 0.6  AST '21 21 16 ' 14*  ALT '17 24 14 12  ' ALKPHOS 52 68 61 66  PROT 6.7 7.3 6.0* 6.7  ALBUMIN 3.2* 3.8 2.7* 2.7*    TUMOR MARKERS: No results for input(s): AFPTM, CEA, CA199, CHROMGRNA in the last 8760 hours.  Assessment and Plan:  Multiple Myeloma recent diagnosis PET revealing left iliac lesion; lung mass Scheduled for left iliac bone lesion biopsy Risks and benefits of L iliac bone lesion biopsy was discussed with the patient and/or patient's family including, but not limited to bleeding, infection, damage to adjacent structures or low yield requiring additional tests.  All of the questions were answered and there is agreement to proceed. Consent signed and in chart.  Thank you for this interesting consult.  I greatly enjoyed meeting Randy Fitzpatrick. and look forward to participating in their care.  A copy of this report was sent to the requesting provider on this date.  Electronically Signed: Lavonia Drafts, PA-C 02/03/2019, 10:11 AM   I spent a total of  30 Minutes   in face to face in clinical consultation, greater than 50% of which was counseling/coordinating care for left iliac bone lesion bx

## 2019-02-03 NOTE — Procedures (Signed)
Interventional Radiology Procedure:   Indications: Myeloma and left iliac bone lesion  Procedure: CT guided bone lesion biopsy  Findings: Left iliac bone lesion.  Multiple cores obtained.   Complications: None     EBL: less than 10 ml  Plan: Bedrest and discharge to home in 1 hour.    Murriel Holwerda R. Anselm Pancoast, MD  Pager: [phone]

## 2019-02-03 NOTE — Discharge Instructions (Addendum)
Needle Biopsy, Care After This sheet gives you information about how to care for yourself after your procedure. Your health care provider may also give you more specific instructions. If you have problems or questions, contact your health care provider. What can I expect after the procedure? After the procedure, it is common to have soreness, bruising, or mild pain at the puncture site. This should go away in a few days. Follow these instructions at home: Needle insertion site care   Wash your hands with soap and water before you change your bandage (dressing). If you cannot use soap and water, use hand sanitizer.  Follow instructions from your health care provider about how to take care of your puncture site. This includes: ? When and how to change your dressing. ? When to remove your dressing.  Check your puncture site every day for signs of infection. Check for: ? Redness, swelling, or pain. ? Fluid or blood. ? Pus or a bad smell. ? Warmth. General instructions  Return to your normal activities as told by your health care provider. Ask your health care provider what activities are safe for you.  Do not take baths, swim, or use a hot tub until your health care provider approves. Ask your health care provider if you may take showers. You may only be allowed to take sponge baths.  Take over-the-counter and prescription medicines only as told by your health care provider.  Keep all follow-up visits as told by your health care provider. This is important. Contact a health care provider if:  You have a fever.  You have redness, swelling, or pain at the puncture site that lasts longer than a few days.  You have fluid, blood, or pus coming from your puncture site.  Your puncture site feels warm to the touch. Get help right away if:  You have severe bleeding from the puncture site. Summary  After the procedure, it is common to have soreness, bruising, or mild pain at the puncture  site. This should go away in a few days.  Check your puncture site every day for signs of infection, such as redness, swelling, or pain.  Get help right away if you have severe bleeding from your puncture site. This information is not intended to replace advice given to you by your health care provider. Make sure you discuss any questions you have with your health care provider. Document Released: 10/27/2014 Document Revised: 08/24/2017 Document Reviewed: 06/25/2017 Elsevier Patient Education  [employer]. Moderate Conscious Sedation, Adult, Care After These instructions provide you with information about caring for yourself after your procedure. Your health care provider may also give you more specific instructions. Your treatment has been planned according to current medical practices, but problems sometimes occur. Call your health care provider if you have any problems or questions after your procedure. What can I expect after the procedure? After your procedure, it is common:  To feel sleepy for several hours.  To feel clumsy and have poor balance for several hours.  To have poor judgment for several hours.  To vomit if you eat too soon. Follow these instructions at home: For at least 24 hours after the procedure:   Do not: ? Participate in activities where you could fall or become injured. ? Drive. ? Use heavy machinery. ? Drink alcohol. ? Take sleeping pills or medicines that cause drowsiness. ? Make important decisions or sign legal documents. ? Take care of children on your own.  Rest. Eating and  drinking  Follow the diet recommended by your health care provider.  If you vomit: ? Drink water, juice, or soup when you can drink without vomiting. ? Make sure you have little or no nausea before eating solid foods. General instructions  Have a responsible adult stay with you until you are awake and alert.  Take over-the-counter and prescription medicines only as  told by your health care provider.  If you smoke, do not smoke without supervision.  Keep all follow-up visits as told by your health care provider. This is important. Contact a health care provider if:  You keep feeling nauseous or you keep vomiting.  You feel light-headed.  You develop a rash.  You have a fever. Get help right away if:  You have trouble breathing. This information is not intended to replace advice given to you by your health care provider. Make sure you discuss any questions you have with your health care provider. Document Released: 04/02/2013 Document Revised: 05/25/2017 Document Reviewed: 10/02/2015 Elsevier Patient Education  2020 [employer].

## 2019-02-03 NOTE — Sedation Documentation (Signed)
No sedation given.

## 2019-02-06 ENCOUNTER — Other Ambulatory Visit: Payer: Self-pay | Admitting: Cardiology

## 2019-02-11 ENCOUNTER — Inpatient Hospital Stay (HOSPITAL_COMMUNITY)
Admission: EM | Admit: 2019-02-11 | Discharge: 2019-02-17 | DRG: 392 | Disposition: A | Discharge status: Skilled Nursing Facility | Payer: Medicare Other | Source: Other Acute Inpatient Hospital | Attending: Internal Medicine | Admitting: Internal Medicine

## 2019-02-11 ENCOUNTER — Other Ambulatory Visit: Payer: Self-pay | Admitting: Internal Medicine

## 2019-02-11 ENCOUNTER — Emergency Department (HOSPITAL_COMMUNITY)
Admission: EM | Admit: 2019-02-11 | Discharge: 2019-02-11 | Discharge status: Absent without leave | Payer: Medicare Other

## 2019-02-11 ENCOUNTER — Inpatient Hospital Stay (HOSPITAL_COMMUNITY): Admission: AD | Admit: 2019-02-11 | Payer: Medicare Other | Admitting: Internal Medicine

## 2019-02-11 ENCOUNTER — Other Ambulatory Visit: Payer: Self-pay

## 2019-02-11 DIAGNOSIS — Z955 Presence of coronary angioplasty implant and graft: Secondary | ICD-10-CM

## 2019-02-11 DIAGNOSIS — D539 Nutritional anemia, unspecified: Secondary | ICD-10-CM | POA: Diagnosis present

## 2019-02-11 DIAGNOSIS — N185 Chronic kidney disease, stage 5: Secondary | ICD-10-CM | POA: Diagnosis present

## 2019-02-11 DIAGNOSIS — E872 Acidosis: Secondary | ICD-10-CM | POA: Diagnosis present

## 2019-02-11 DIAGNOSIS — R338 Other retention of urine: Secondary | ICD-10-CM | POA: Diagnosis present

## 2019-02-11 DIAGNOSIS — N401 Enlarged prostate with lower urinary tract symptoms: Secondary | ICD-10-CM | POA: Diagnosis present

## 2019-02-11 DIAGNOSIS — E1122 Type 2 diabetes mellitus with diabetic chronic kidney disease: Secondary | ICD-10-CM | POA: Diagnosis present

## 2019-02-11 DIAGNOSIS — R34 Anuria and oliguria: Secondary | ICD-10-CM | POA: Diagnosis not present

## 2019-02-11 DIAGNOSIS — Z7982 Long term (current) use of aspirin: Secondary | ICD-10-CM

## 2019-02-11 DIAGNOSIS — C7951 Secondary malignant neoplasm of bone: Secondary | ICD-10-CM | POA: Diagnosis present

## 2019-02-11 DIAGNOSIS — H9190 Unspecified hearing loss, unspecified ear: Secondary | ICD-10-CM | POA: Diagnosis present

## 2019-02-11 DIAGNOSIS — I5042 Chronic combined systolic (congestive) and diastolic (congestive) heart failure: Secondary | ICD-10-CM | POA: Diagnosis present

## 2019-02-11 DIAGNOSIS — C799 Secondary malignant neoplasm of unspecified site: Secondary | ICD-10-CM | POA: Diagnosis present

## 2019-02-11 DIAGNOSIS — Z7401 Bed confinement status: Secondary | ICD-10-CM

## 2019-02-11 DIAGNOSIS — C3411 Malignant neoplasm of upper lobe, right bronchus or lung: Secondary | ICD-10-CM | POA: Diagnosis present

## 2019-02-11 DIAGNOSIS — E039 Hypothyroidism, unspecified: Secondary | ICD-10-CM | POA: Diagnosis present

## 2019-02-11 DIAGNOSIS — Z66 Do not resuscitate: Secondary | ICD-10-CM | POA: Diagnosis not present

## 2019-02-11 DIAGNOSIS — I132 Hypertensive heart and chronic kidney disease with heart failure and with stage 5 chronic kidney disease, or end stage renal disease: Secondary | ICD-10-CM | POA: Diagnosis present

## 2019-02-11 DIAGNOSIS — E118 Type 2 diabetes mellitus with unspecified complications: Secondary | ICD-10-CM | POA: Diagnosis present

## 2019-02-11 DIAGNOSIS — Z801 Family history of malignant neoplasm of trachea, bronchus and lung: Secondary | ICD-10-CM

## 2019-02-11 DIAGNOSIS — Z7902 Long term (current) use of antithrombotics/antiplatelets: Secondary | ICD-10-CM

## 2019-02-11 DIAGNOSIS — Z87891 Personal history of nicotine dependence: Secondary | ICD-10-CM

## 2019-02-11 DIAGNOSIS — E782 Mixed hyperlipidemia: Secondary | ICD-10-CM | POA: Diagnosis present

## 2019-02-11 DIAGNOSIS — I252 Old myocardial infarction: Secondary | ICD-10-CM

## 2019-02-11 DIAGNOSIS — E875 Hyperkalemia: Secondary | ICD-10-CM | POA: Diagnosis present

## 2019-02-11 DIAGNOSIS — I2510 Atherosclerotic heart disease of native coronary artery without angina pectoris: Secondary | ICD-10-CM | POA: Diagnosis present

## 2019-02-11 DIAGNOSIS — K5792 Diverticulitis of intestine, part unspecified, without perforation or abscess without bleeding: Secondary | ICD-10-CM | POA: Diagnosis present

## 2019-02-11 DIAGNOSIS — E860 Dehydration: Secondary | ICD-10-CM | POA: Diagnosis present

## 2019-02-11 DIAGNOSIS — I340 Nonrheumatic mitral (valve) insufficiency: Secondary | ICD-10-CM | POA: Diagnosis present

## 2019-02-11 DIAGNOSIS — K5732 Diverticulitis of large intestine without perforation or abscess without bleeding: Principal | ICD-10-CM | POA: Diagnosis present

## 2019-02-11 DIAGNOSIS — Z79890 Hormone replacement therapy (postmenopausal): Secondary | ICD-10-CM

## 2019-02-11 DIAGNOSIS — C9000 Multiple myeloma not having achieved remission: Secondary | ICD-10-CM | POA: Diagnosis present

## 2019-02-11 DIAGNOSIS — Z20828 Contact with and (suspected) exposure to other viral communicable diseases: Secondary | ICD-10-CM | POA: Diagnosis present

## 2019-02-11 DIAGNOSIS — Z515 Encounter for palliative care: Secondary | ICD-10-CM | POA: Diagnosis not present

## 2019-02-11 DIAGNOSIS — Z79899 Other long term (current) drug therapy: Secondary | ICD-10-CM

## 2019-02-11 LAB — GLUCOSE, CAPILLARY: Glucose-Capillary: 68 mg/dL — ABNORMAL LOW (ref 70–99)

## 2019-02-11 MED ORDER — LACTATED RINGERS IV SOLN
INTRAVENOUS | Status: DC
  Administered 2019-02-11: 23:00:00 via INTRAVENOUS

## 2019-02-11 MED ORDER — INSULIN ASPART 100 UNIT/ML ~~LOC~~ SOLN: 0.0000 [IU] | Freq: Three times a day (TID) | SUBCUTANEOUS | Status: DC

## 2019-02-11 MED ORDER — ACETAMINOPHEN 650 MG RE SUPP: 650.0000 mg | Freq: Four times a day (QID) | RECTAL | Status: DC | PRN

## 2019-02-11 MED ORDER — DEXTROSE IN LACTATED RINGERS 5 % IV SOLN
INTRAVENOUS | Status: DC
  Administered 2019-02-11: via INTRAVENOUS

## 2019-02-11 MED ORDER — POLYETHYLENE GLYCOL 3350 17 G PO PACK: 17.0000 g | PACK | Freq: Every day | ORAL | Status: DC | PRN

## 2019-02-11 MED ORDER — ONDANSETRON HCL 4 MG/2ML IJ SOLN
4.0000 mg | Freq: Four times a day (QID) | INTRAMUSCULAR | Status: DC | PRN
  Administered 2019-02-13 – 2019-02-15 (×2): 4 mg via INTRAVENOUS
  Filled 2019-02-11 (×2): qty 2

## 2019-02-11 MED ORDER — ASPIRIN EC 81 MG PO TBEC
81.0000 mg | DELAYED_RELEASE_TABLET | Freq: Every day | ORAL | Status: DC
  Administered 2019-02-12 – 2019-02-17 (×5): 81 mg via ORAL
  Filled 2019-02-11 (×7): qty 1

## 2019-02-11 MED ORDER — ACETAMINOPHEN 325 MG PO TABS: 650.0000 mg | ORAL_TABLET | Freq: Four times a day (QID) | ORAL | Status: DC | PRN

## 2019-02-11 MED ORDER — METRONIDAZOLE IN NACL 5-0.79 MG/ML-% IV SOLN
500.0000 mg | Freq: Three times a day (TID) | INTRAVENOUS | Status: DC
  Administered 2019-02-12 – 2019-02-13 (×6): 500 mg via INTRAVENOUS
  Filled 2019-02-11 (×6): qty 100

## 2019-02-11 MED ORDER — CLOPIDOGREL BISULFATE 75 MG PO TABS
75.0000 mg | ORAL_TABLET | Freq: Every day | ORAL | Status: DC
  Administered 2019-02-12 – 2019-02-16 (×4): 75 mg via ORAL
  Filled 2019-02-11 (×7): qty 1

## 2019-02-11 MED ORDER — SODIUM CHLORIDE 0.9 % IV SOLN
2.0000 g | INTRAVENOUS | Status: DC
  Administered 2019-02-11 – 2019-02-15 (×5): 2 g via INTRAVENOUS
  Filled 2019-02-11 (×5): qty 20

## 2019-02-11 MED ORDER — HEPARIN SODIUM (PORCINE) 5000 UNIT/ML IJ SOLN
5000.0000 [IU] | Freq: Three times a day (TID) | INTRAMUSCULAR | Status: DC
  Administered 2019-02-11 – 2019-02-16 (×11): 5000 [IU] via SUBCUTANEOUS
  Filled 2019-02-11 (×14): qty 1

## 2019-02-11 MED ORDER — ONDANSETRON HCL 4 MG PO TABS: 4.0000 mg | ORAL_TABLET | Freq: Four times a day (QID) | ORAL | Status: DC | PRN

## 2019-02-11 NOTE — H&P (Addendum)
History and Physical    Randy Fitzpatrick. HFW:[id number] DOB: 06-Oct-1924 DOA: 02/11/2019  PCP: Carolin Guernsey, MD   Patient coming from: Home  I have personally briefly reviewed patient's old medical records in [place]  Chief Complaint: Diarrhea, lower abdominal pain  HPI: Randy Fitzpatrick. is a 85 y.o. male with medical history significant for diabetes mellitus, CHF, CKD, multiple myeloma, coronary artery disease, presented to [facility] ED with complaints of multiple episodes of diarrhea over the past 3 days.  Patient also reported lower abdominal pain.  No vomiting.  The time of my evaluation he denies pain.  At [facility] ED, patient blood work and imaging done.  WBC 6.7, hemoglobin 10.7 creatinine 5.1.  Serum bicarb 16, potassium 5.6.  Anion gap 21.  UA showed >300 protein, large blood, trace leukocyte esterase, negative nitrites, 5-10 WBCs, 10-20 RBCs, small bacteria.  EKG -sinus rhythm rate 89, QTc 481.  No significant ST or T wave abnormalities.  CT abdomen without contrast showed mild sigmoid diverticulitis, no drainable fluid collection/abscess.  Patient was started on IV ciprofloxacin and metronidazole.  D50, Kayexalate, NovoLog, calcium gluconate given. EDP to Dr. Delton Coombes,  admission here under hospitalist service, he will see in a.m.   ED provider notes, blood work and imaging results in physical chart.  Review of Systems: As per HPI all other systems reviewed and negative.  Past Medical History:  Diagnosis Date   Acute ST elevation myocardial infarction (STEMI) of inferior wall ([place]) 11/03/2017   PCI/DESx3 to RCA after wire dissection. EF 45-50%   BPH (benign prostatic hyperplasia)    CAD (coronary artery disease)    Multivessel/left main disease   Hypothyroidism    Mitral regurgitation    Mixed hyperlipidemia    Type 2 diabetes mellitus ([place])     Past Surgical History:  Procedure Laterality Date   CORONARY STENT INTERVENTION N/A  11/03/2017   Procedure: CORONARY STENT INTERVENTION;  Surgeon: Lorretta Harp, MD;  Location: [place] [facility];  Service: Cardiovascular;  Laterality: N/A;   CORONARY/GRAFT ACUTE MI REVASCULARIZATION N/A 11/03/2017   Procedure: Coronary/Graft Acute MI Revascularization;  Surgeon: Lorretta Harp, MD;  Location: [place] [facility];  Service: Cardiovascular;  Laterality: N/A;   LEFT HEART CATH AND CORONARY ANGIOGRAPHY N/A 11/03/2017   Procedure: LEFT HEART CATH AND CORONARY ANGIOGRAPHY;  Surgeon: Lorretta Harp, MD;  Location: [place] [facility];  Service: Cardiovascular;  Laterality: N/A;   STOMACH SURGERY     Treatment of an abscess     reports that he quit smoking about 36 years ago. His smoking use included cigarettes. He has a 40.00 pack-year smoking history. He has never used smokeless tobacco. He reports that he does not drink alcohol or use drugs.  No Known Allergies  Family History  Problem Relation Age of Onset   Heart failure Father 34   Parkinsonism Mother 59   Lung cancer Brother     Prior to Admission medications   Medication Sig Start Date End Date Taking? Authorizing Provider  aspirin EC 81 MG EC tablet Take 1 tablet (81 mg total) by mouth daily. 11/09/17   Cheryln Manly, NP  atorvastatin (LIPITOR) 80 MG tablet Take 1 tablet (80 mg total) by mouth daily. 08/12/18   Satira Sark, MD  carvedilol (COREG) 3.125 MG tablet Take 1 tablet (3.125 mg total) by mouth 2 (two) times daily with a meal. 08/12/18   Satira Sark, MD  clopidogrel (PLAVIX) 75 MG tablet Take 1 tablet (75 mg total) by mouth daily. 10/30/18   Satira Sark, MD  finasteride (PROSCAR) 5 MG tablet Take 5 mg by mouth daily.    [provider]  furosemide (LASIX) 20 MG tablet TAKE 1 TABLET EVERY OTHER DAY. 02/06/19   Strader, Fransisco Hertz, PA-C  levothyroxine (SYNTHROID, LEVOTHROID) 50 MCG tablet Take 50 mcg by mouth daily.  02/29/12   [provider]  nitroGLYCERIN  (NITROSTAT) 0.4 MG SL tablet Place 1 tablet (0.4 mg total) under the tongue every 5 (five) minutes x 3 doses as needed for chest pain. 11/08/17   Cheryln Manly, NP  pantoprazole (PROTONIX) 40 MG tablet Take 40 mg by mouth 2 (two) times daily.    [provider]  tamsulosin (FLOMAX) 0.4 MG CAPS capsule Take 0.4 mg by mouth daily.     [provider]    Physical Exam: Vitals:   02/11/19 2145  BP: 124/68  Pulse: 94  Resp: 18  SpO2: 100%  Weight: 63.3 kg    Constitutional: NAD, calm, comfortable, extremely hard of hearing Vitals:   02/11/19 2145  BP: 124/68  Pulse: 94  Resp: 18  SpO2: 100%  Weight: 63.3 kg   Eyes: PERRL, lids and conjunctivae normal ENMT: Mucous membranes are dry. Posterior pharynx clear of any exudate or lesions. Neck: normal, supple, no masses, no thyromegaly Respiratory: clear to auscultation bilaterally, no wheezing, no crackles. Normal respiratory effort. No accessory muscle use.  Cardiovascular: Regular rate and rhythm, no murmurs / rubs / gallops. 2+ pitting pedal edema, chronic stasis changes worse on left lower extremity. Per daughter swelling is chronic, but worse over the past week.. 2+ pedal pulses.   Abdomen: no tenderness, no masses palpated. No hepatosplenomegaly. Bowel sounds positive.  Musculoskeletal: no clubbing / cyanosis. No joint deformity upper and lower extremities. Good ROM, no contractures. Normal muscle tone.  Skin: no rashes, lesions, ulcers. No induration Neurologic: No gross cranial nerve abnormalities, moving all extremities spontaneously. Psychiatric: Normal judgment and insight. Alert and oriented x 2. Normal mood.   Labs on Admission: I have personally reviewed following labs and imaging studies   Radiological Exams on Admission: No results found.  EKG: NONE.  Assessment/Plan Active Problems:   Diverticulitis   Diverticulitis- WBC 6.7.  Multiple stools.  Benign abdominal exam.  CT-mild sigmoid  diverticulitis no drainable fluid collection/abscess.  Still having watery stools here. -Insert rectal tube -Stool C. Difficile, GI pathogen panel -Bowel Rest with clear liquid diet -CBC a.m. -Gentle fluids D 5 R/L 50cc/hr x 15 hrs - IV ceftriazone and metronidazole  CKD 5-creatinine prior to admission 5.1.  Last check creatinine 5.3.  Creatinine has significantly increased over the past 10 months from 2.2 >> 5.1.  -BMP a.m. -Hold home low-dose Lasix  -Hydrate gently  Hyperkalemia-potassium 5.6.  Patient was given D50, insulin, calcium gluconate, Kayexalate. -BMP check  Anion gap metabolic acidosis-serum bicarb 16, anion gap 21.  Random glucose 87.  Likely due to diarrhea, worsening renal insufficiency. -Hydrate with Ringer's lactate  Systolic and diastolic CHF-compensated but with chronic 2+ lower extremity edema.  Last echo 03/2018 EF 20%, grade 2 diastolic dysfunction.  Patient's Lasix 20 mg every other day.  Follows with Dr. Domenic Polite. -Hydrate gently.  Multiple myeloma, macrocytic anemia-follows with Dr. Delton Coombes.  Currently on chemotherapy. -Oncology consult  Hypertension, BPH -stable. -Resume home tamsulosin, finasteride-pending med reconciliation  Diabetes mellitus-not on medications.  Glucose 87 at outside facility. - Recheck glucose,  fasting CBGs  CAD- Multivessel/left main CAD status post inferior STEMI in May 2019, DES x3 to the RCA.   -Resume home aspirin, Plavix -Hold statin with poor p.o. intake  DVT prophylaxis: Heparin Code Status: Full code confirmed with daughter Randy Fitzpatrick at bedside. Family Communication: Daughter Randy Fitzpatrick at bedside, who is healthcare power of attorney.  All questions answered. Disposition Plan: Per rounding team Consults called: Oncology Admission status:  Obseveration, telemetry   Randy Roys MD [facility]  02/11/2019, 10:24 PM

## 2019-02-11 NOTE — Progress Notes (Signed)
85 year old male with history of coronary artery disease, multiple myeloma, diabetes mellitus CKD, presented to [facility], with complaints of multiple episodes of diarrhea, mild abdominal pain.  Blood work showed potassium of 5.6, creatinine 5.11, serum bicarb 16.  Abdominal CT showed sigmoid diverticulitis.  Patient was given IV ciprofloxacin and Flagyl, calcium gluconate, dextrose D50 and insulin and Kayexalate. EDP Dr. Sharia Reeve talked to Dr. Delton Coombes, patient's oncologist who recommended admission to [facility], under hospitalist service, they will follow up with patient see in a.m.   Temporary bed request order- observation, telemetry.  Arlyce Dice, MD. TRH. 6:19 PM 02/11/19  NO CHARGE.

## 2019-02-12 ENCOUNTER — Other Ambulatory Visit: Payer: Self-pay

## 2019-02-12 ENCOUNTER — Other Ambulatory Visit (HOSPITAL_COMMUNITY): Payer: Medicare Other

## 2019-02-12 ENCOUNTER — Ambulatory Visit (HOSPITAL_COMMUNITY): Payer: Medicare Other

## 2019-02-12 ENCOUNTER — Encounter (HOSPITAL_COMMUNITY): Payer: Self-pay

## 2019-02-12 ENCOUNTER — Ambulatory Visit (HOSPITAL_COMMUNITY): Payer: Medicare Other | Admitting: Hematology

## 2019-02-12 DIAGNOSIS — C799 Secondary malignant neoplasm of unspecified site: Secondary | ICD-10-CM | POA: Diagnosis present

## 2019-02-12 DIAGNOSIS — K5792 Diverticulitis of intestine, part unspecified, without perforation or abscess without bleeding: Secondary | ICD-10-CM | POA: Diagnosis not present

## 2019-02-12 DIAGNOSIS — E1122 Type 2 diabetes mellitus with diabetic chronic kidney disease: Secondary | ICD-10-CM | POA: Diagnosis present

## 2019-02-12 DIAGNOSIS — C9000 Multiple myeloma not having achieved remission: Secondary | ICD-10-CM | POA: Diagnosis present

## 2019-02-12 DIAGNOSIS — H9190 Unspecified hearing loss, unspecified ear: Secondary | ICD-10-CM | POA: Diagnosis present

## 2019-02-12 DIAGNOSIS — I132 Hypertensive heart and chronic kidney disease with heart failure and with stage 5 chronic kidney disease, or end stage renal disease: Secondary | ICD-10-CM | POA: Diagnosis present

## 2019-02-12 DIAGNOSIS — K5732 Diverticulitis of large intestine without perforation or abscess without bleeding: Secondary | ICD-10-CM | POA: Diagnosis present

## 2019-02-12 DIAGNOSIS — C7951 Secondary malignant neoplasm of bone: Secondary | ICD-10-CM | POA: Diagnosis present

## 2019-02-12 DIAGNOSIS — E039 Hypothyroidism, unspecified: Secondary | ICD-10-CM | POA: Diagnosis present

## 2019-02-12 DIAGNOSIS — C3411 Malignant neoplasm of upper lobe, right bronchus or lung: Secondary | ICD-10-CM | POA: Diagnosis present

## 2019-02-12 DIAGNOSIS — Z515 Encounter for palliative care: Secondary | ICD-10-CM | POA: Diagnosis not present

## 2019-02-12 DIAGNOSIS — I2510 Atherosclerotic heart disease of native coronary artery without angina pectoris: Secondary | ICD-10-CM | POA: Diagnosis present

## 2019-02-12 DIAGNOSIS — R34 Anuria and oliguria: Secondary | ICD-10-CM | POA: Diagnosis not present

## 2019-02-12 DIAGNOSIS — Z20828 Contact with and (suspected) exposure to other viral communicable diseases: Secondary | ICD-10-CM | POA: Diagnosis present

## 2019-02-12 DIAGNOSIS — I252 Old myocardial infarction: Secondary | ICD-10-CM | POA: Diagnosis not present

## 2019-02-12 DIAGNOSIS — E782 Mixed hyperlipidemia: Secondary | ICD-10-CM | POA: Diagnosis present

## 2019-02-12 DIAGNOSIS — D539 Nutritional anemia, unspecified: Secondary | ICD-10-CM | POA: Diagnosis present

## 2019-02-12 DIAGNOSIS — R338 Other retention of urine: Secondary | ICD-10-CM | POA: Diagnosis present

## 2019-02-12 DIAGNOSIS — Z66 Do not resuscitate: Secondary | ICD-10-CM | POA: Diagnosis not present

## 2019-02-12 DIAGNOSIS — I5042 Chronic combined systolic (congestive) and diastolic (congestive) heart failure: Secondary | ICD-10-CM | POA: Diagnosis present

## 2019-02-12 DIAGNOSIS — R197 Diarrhea, unspecified: Secondary | ICD-10-CM | POA: Diagnosis present

## 2019-02-12 DIAGNOSIS — N401 Enlarged prostate with lower urinary tract symptoms: Secondary | ICD-10-CM | POA: Diagnosis present

## 2019-02-12 DIAGNOSIS — E872 Acidosis: Secondary | ICD-10-CM | POA: Diagnosis present

## 2019-02-12 DIAGNOSIS — N185 Chronic kidney disease, stage 5: Secondary | ICD-10-CM | POA: Diagnosis present

## 2019-02-12 DIAGNOSIS — E875 Hyperkalemia: Secondary | ICD-10-CM | POA: Diagnosis present

## 2019-02-12 DIAGNOSIS — I340 Nonrheumatic mitral (valve) insufficiency: Secondary | ICD-10-CM | POA: Diagnosis present

## 2019-02-12 DIAGNOSIS — E118 Type 2 diabetes mellitus with unspecified complications: Secondary | ICD-10-CM

## 2019-02-12 DIAGNOSIS — E860 Dehydration: Secondary | ICD-10-CM | POA: Diagnosis present

## 2019-02-12 LAB — BASIC METABOLIC PANEL
Anion gap: 13 (ref 5–15)
Anion gap: 11 (ref 5–15)
BUN: 91 mg/dL — ABNORMAL HIGH (ref 8–23)
BUN: 92 mg/dL — ABNORMAL HIGH (ref 8–23)
CO2: 12 mmol/L — ABNORMAL LOW (ref 22–32)
CO2: 17 mmol/L — ABNORMAL LOW (ref 22–32)
Calcium: 8.2 mg/dL — ABNORMAL LOW (ref 8.9–10.3)
Calcium: 8.2 mg/dL — ABNORMAL LOW (ref 8.9–10.3)
Chloride: 109 mmol/L (ref 98–111)
Chloride: 109 mmol/L (ref 98–111)
Creatinine, Ser: 5 mg/dL — ABNORMAL HIGH (ref 0.61–1.24)
Creatinine, Ser: 5.03 mg/dL — ABNORMAL HIGH (ref 0.61–1.24)
GFR calc Af Amer: 11 mL/min — ABNORMAL LOW (ref >60)
GFR calc Af Amer: 11 mL/min — ABNORMAL LOW (ref >60)
GFR calc non Af Amer: 9 mL/min — ABNORMAL LOW (ref >60)
GFR calc non Af Amer: 9 mL/min — ABNORMAL LOW (ref >60)
Glucose, Bld: 75 mg/dL (ref 70–99)
Glucose, Bld: 80 mg/dL (ref 70–99)
Potassium: 4.9 mmol/L (ref 3.5–5.1)
Potassium: 4.9 mmol/L (ref 3.5–5.1)
Sodium: 134 mmol/L — ABNORMAL LOW (ref 135–145)
Sodium: 137 mmol/L (ref 135–145)

## 2019-02-12 LAB — CBC
HCT: 32.9 % — ABNORMAL LOW (ref 39.0–52.0)
Hemoglobin: 9.5 g/dL — ABNORMAL LOW (ref 13.0–17.0)
MCH: 29 pg (ref 26.0–34.0)
MCHC: 28.9 g/dL — ABNORMAL LOW (ref 30.0–36.0)
MCV: 100.3 fL — ABNORMAL HIGH (ref 80.0–100.0)
Platelets: 154 10*3/uL (ref 150–400)
RBC: 3.28 MIL/uL — ABNORMAL LOW (ref 4.22–5.81)
RDW: 17.7 % — ABNORMAL HIGH (ref 11.5–15.5)
WBC: 5.7 10*3/uL (ref 4.0–10.5)
nRBC: 0 % (ref 0.0–0.2)

## 2019-02-12 LAB — GLUCOSE, CAPILLARY
Glucose-Capillary: 65 mg/dL — ABNORMAL LOW (ref 70–99)
Glucose-Capillary: 78 mg/dL (ref 70–99)

## 2019-02-12 LAB — C DIFFICILE QUICK SCREEN W PCR REFLEX
C Diff antigen: NEGATIVE
C Diff interpretation: NOT DETECTED
C Diff toxin: NEGATIVE

## 2019-02-12 LAB — HEMOGLOBIN A1C
Hgb A1c MFr Bld: 5.7 % — ABNORMAL HIGH (ref 4.8–5.6)
Mean Plasma Glucose: 116.89 mg/dL

## 2019-02-12 LAB — SARS CORONAVIRUS 2 BY RT PCR (HOSPITAL ORDER, PERFORMED IN ~~LOC~~ HOSPITAL LAB): SARS Coronavirus 2: NEGATIVE

## 2019-02-12 MED ORDER — SODIUM CHLORIDE 0.9 % IV SOLN
INTRAVENOUS | Status: DC | PRN
  Administered 2019-02-12: 250 mL via INTRAVENOUS
  Administered 2019-02-13: 500 mL via INTRAVENOUS

## 2019-02-12 NOTE — Consult Note (Signed)
[facility] Consultation Oncology  Name: Randy Fitzpatrick.      MRN: [id number]    Location: A301/A301-01  Date: 02/12/2019 Time:8:26 PM   REFERRING PHYSICIAN: Dr. Roderic Palau  REASON FOR CONSULT: To discus new diagnosis and prognosis.   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  HISTORY OF PRESENT ILLNESS: [name] is a 85 year old very pleasant white male known to me from my office practice.  He was not feeling well for the past few days and having diarrhea, vomiting and abdominal pain.  He presented to [facility] ER.  A CT scan done at that facility apparently showed diverticulitis.  He was transferred to [facility] and was started on intravenous antibiotics.  He was worked up for kidney disease and a monoclonal gammopathy in our clinic.  He had a bone marrow biopsy done which showed 12% plasma cells.  A PET CT scan done showed left iliac lesion, right femur lesion, C7 spine lesion and right upper lobe lung nodule.  Biopsy of the left iliac area was done on 02/03/2019.  This was consistent with adenocarcinoma/adenosquamous carcinoma.  Since the biopsy, he has gotten progressively weaker.  His daughter [name] is at the bedside and provides most of the history.  She reported that his dog recently died because of cancer.  Since then patient has become more depressed.  Today he denies any abdominal pain.  Denies any hip pains.  He reported having some shoulder pains which have also subsided.  C. difficile testing was found to be negative.  Diet is being advanced as tolerated.  He was never started on any therapy for multiple myeloma even though we talked about starting him on Velcade and dexamethasone.  PAST MEDICAL HISTORY:   Past Medical History:  Diagnosis Date  . Acute ST elevation myocardial infarction (STEMI) of inferior wall ([facility]) 11/03/2017   PCI/DESx3 to RCA after wire dissection. EF 45-50%  . BPH (benign prostatic hyperplasia)   .  CAD (coronary artery disease)    Multivessel/left main disease  . Hypothyroidism   . Mitral regurgitation   . Mixed hyperlipidemia   . Type 2 diabetes mellitus ([facility])     ALLERGIES: No Known Allergies    MEDICATIONS: I have reviewed the patient's current medications.     PAST SURGICAL HISTORY Past Surgical History:  Procedure Laterality Date  . CORONARY STENT INTERVENTION N/A 11/03/2017   Procedure: CORONARY STENT INTERVENTION;  Surgeon: Lorretta Harp, MD;  Location: [place] [facility];  Service: Cardiovascular;  Laterality: N/A;  . CORONARY/GRAFT ACUTE MI REVASCULARIZATION N/A 11/03/2017   Procedure: Coronary/Graft Acute MI Revascularization;  Surgeon: Lorretta Harp, MD;  Location: [place] [facility];  Service: Cardiovascular;  Laterality: N/A;  . LEFT HEART CATH AND CORONARY ANGIOGRAPHY N/A 11/03/2017   Procedure: LEFT HEART CATH AND CORONARY ANGIOGRAPHY;  Surgeon: Lorretta Harp, MD;  Location: [place] [facility];  Service: Cardiovascular;  Laterality: N/A;  . STOMACH SURGERY     Treatment of an abscess    FAMILY HISTORY: Family History  Problem Relation Age of Onset  . Heart failure Father 69  . Parkinsonism Mother 80  . Lung cancer Brother     SOCIAL HISTORY:  reports that he quit smoking about 36 years ago. His smoking use included cigarettes. He has a 40.00 pack-year smoking history. He has never used smokeless tobacco. He reports that he does not drink alcohol or use drugs.  PERFORMANCE STATUS: The patient's  performance status is 3 - Symptomatic, >50% confined to bed  PHYSICAL EXAM: Most Recent Vital Signs: Blood pressure 127/72, pulse 83, temperature (!) 97.4 F (36.3 C), temperature source Oral, resp. rate 18, height '5\' 10"'$  (1.778 m), weight 139 lb 8.8 oz (63.3 kg), SpO2 94 %. BP 127/72 (BP Location: Right Arm)   Pulse 83   Temp (!) 97.4 F (36.3 C) (Oral)   Resp 18   Ht '5\' 10"'$  (1.778 m)   Wt 139 lb 8.8 oz (63.3 kg)   SpO2 94%   BMI 20.02 kg/m   General appearance: alert, cooperative and appears stated age Neck: no adenopathy and supple, symmetrical, trachea midline Lungs: clear to auscultation bilaterally Heart: regular rate and rhythm Abdomen: soft, non-tender; bowel sounds normal; no masses,  no organomegaly Extremities: extremities normal, atraumatic, no cyanosis or edema Skin: Skin color, texture, turgor normal. No rashes or lesions Lymph nodes: Cervical, supraclavicular, and axillary nodes normal. Neurologic: Grossly normal  LABORATORY DATA:  Results for orders placed or performed during the hospital encounter of 02/11/19 (from the past 48 hour(s))  C difficile quick scan w PCR reflex     Status: None   Collection Time: 02/11/19  9:40 PM   Specimen: STOOL  Result Value Ref Range   C Diff antigen NEGATIVE NEGATIVE   C Diff toxin NEGATIVE NEGATIVE   C Diff interpretation No C. difficile detected.     Comment: Performed at [facility], [address], [place], [place] [address]  SARS Coronavirus 2 [facility] order, Performed in [facility] hospital lab) Nasopharyngeal STOOL     Status: None   Collection Time: 02/11/19  9:41 PM   Specimen: STOOL; Nasopharyngeal  Result Value Ref Range   SARS Coronavirus 2 NEGATIVE NEGATIVE    Comment: (NOTE) If result is NEGATIVE SARS-CoV-2 target nucleic acids are NOT DETECTED. The SARS-CoV-2 RNA is generally detectable in upper and lower  respiratory specimens during the acute phase of infection. The lowest  concentration of SARS-CoV-2 viral copies this assay can detect is 250  copies / mL. A negative result does not preclude SARS-CoV-2 infection  and should not be used as the sole basis for treatment or other  patient management decisions.  A negative result may occur with  improper specimen collection / handling, submission of specimen other  than nasopharyngeal swab, presence of viral mutation(s) within the  areas targeted by this assay, and inadequate number of viral copies   (<250 copies / mL). A negative result must be combined with clinical  observations, patient history, and epidemiological information. If result is POSITIVE SARS-CoV-2 target nucleic acids are DETECTED. The SARS-CoV-2 RNA is generally detectable in upper and lower  respiratory specimens dur ing the acute phase of infection.  Positive  results are indicative of active infection with SARS-CoV-2.  Clinical  correlation with patient history and other diagnostic information is  necessary to determine patient infection status.  Positive results do  not rule out bacterial infection or co-infection with other viruses. If result is PRESUMPTIVE POSTIVE SARS-CoV-2 nucleic acids MAY BE PRESENT.   A presumptive positive result was obtained on the submitted specimen  and confirmed on repeat testing.  While 2019 novel coronavirus  (SARS-CoV-2) nucleic acids may be present in the submitted sample  additional confirmatory testing may be necessary for epidemiological  and / or clinical management purposes  to differentiate between  SARS-CoV-2 and other Sarbecovirus currently known to infect humans.  If clinically indicated additional testing with an alternate test  methodology [phone]) is advised. The SARS-CoV-2 RNA is generally  detectable in upper and lower respiratory sp ecimens during the acute  phase of infection. The expected result is Negative. Fact Sheet for Patients:  StrictlyIdeas.no Fact Sheet for Healthcare Providers: BankingDealers.co.za This test is not yet approved or cleared by the [place] FDA and has been authorized for detection and/or diagnosis of SARS-CoV-2 by FDA under an Emergency Use Authorization (EUA).  This EUA will remain in effect (meaning this test can be used) for the duration of the COVID-19 declaration under Section 564(b)(1) of the Act, 21 U.S.C. section 360bbb-3(b)(1), unless the authorization is terminated  or revoked sooner. Performed at [facility], [address], [place], [place] [address]   Basic metabolic panel     Status: Abnormal   Collection Time: 02/11/19 11:26 PM  Result Value Ref Range   Sodium 134 (L) 135 - 145 mmol/L   Potassium 4.9 3.5 - 5.1 mmol/L   Chloride 109 98 - 111 mmol/L   CO2 12 (L) 22 - 32 mmol/L   Glucose, Bld 75 70 - 99 mg/dL   BUN 91 (H) 8 - 23 mg/dL   Creatinine, Ser 5.00 (H) 0.61 - 1.24 mg/dL   Calcium 8.2 (L) 8.9 - 10.3 mg/dL   GFR calc non Af Amer 9 (L) >60 mL/min   GFR calc Af Amer 11 (L) >60 mL/min   Anion gap 13 5 - 15    Comment: Performed at [facility], [address], [place], [place] [address]  Glucose, capillary     Status: Abnormal   Collection Time: 02/11/19 11:31 PM  Result Value Ref Range   Glucose-Capillary 68 (L) 70 - 99 mg/dL  Hemoglobin A1c     Status: Abnormal   Collection Time: 02/12/19  5:18 AM  Result Value Ref Range   Hgb A1c MFr Bld 5.7 (H) 4.8 - 5.6 %    Comment: (NOTE) Pre diabetes:          5.7%-6.4% Diabetes:              >6.4% Glycemic control for   <7.0% adults with diabetes    Mean Plasma Glucose 116.89 mg/dL    Comment: Performed at [facility], [place] [address], [place], [place] [address]  Basic metabolic panel     Status: Abnormal   Collection Time: 02/12/19  5:18 AM  Result Value Ref Range   Sodium 137 135 - 145 mmol/L   Potassium 4.9 3.5 - 5.1 mmol/L   Chloride 109 98 - 111 mmol/L   CO2 17 (L) 22 - 32 mmol/L   Glucose, Bld 80 70 - 99 mg/dL   BUN 92 (H) 8 - 23 mg/dL   Creatinine, Ser 5.03 (H) 0.61 - 1.24 mg/dL   Calcium 8.2 (L) 8.9 - 10.3 mg/dL   GFR calc non Af Amer 9 (L) >60 mL/min   GFR calc Af Amer 11 (L) >60 mL/min   Anion gap 11 5 - 15    Comment: Performed at [facility], [address], [place], [place] [address]  CBC     Status: Abnormal   Collection Time: 02/12/19  5:18 AM  Result Value Ref Range   WBC 5.7 4.0 - 10.5 K/uL   RBC 3.28 (L) 4.22 - 5.81 MIL/uL   Hemoglobin 9.5 (L) 13.0 -  17.0 g/dL   HCT 32.9 (L) 39.0 - 52.0 %   MCV 100.3 (H) 80.0 - 100.0 fL   MCH 29.0 26.0 -  34.0 pg   MCHC 28.9 (L) 30.0 - 36.0 g/dL   RDW 17.7 (H) 11.5 - 15.5 %   Platelets 154 150 - 400 K/uL   nRBC 0.0 0.0 - 0.2 %    Comment: Performed at [facility], [address], [place], [place] [address]  Glucose, capillary     Status: Abnormal   Collection Time: 02/12/19  7:36 AM  Result Value Ref Range   Glucose-Capillary 65 (L) 70 - 99 mg/dL  Glucose, capillary     Status: None   Collection Time: 02/12/19  9:00 AM  Result Value Ref Range   Glucose-Capillary 78 70 - 99 mg/dL   Comment 1 Notify RN    Comment 2 Document in Chart       RADIOGRAPHY: No results found.     PATHOLOGY: I discussed the pathology report in detail with the patient and his daughter [name].  ASSESSMENT and PLAN:  1.  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  Given the right lung nodule, this is most likely consistent with lung primary. -I have discussed with the patient and his daughter about advanced nature of the disease.  Given his advanced age and comorbidities, he is not a candidate for any chemotherapy. - Hence I have recommended best supportive care in the form of hospice with good pain control.  Patient reports that he is not in any pain at this time. - Patient's wife resides at [place] home.  Patient's daughter and the patient would like to be transferred there, with hospice if possible. -I will also contact patient's primary doctor Dr. Hampton Abbot in [place].  2.  Multiple myeloma: - This was diagnosed as part of work-up for his worsening kidney function. - He has not been started on therapy yet, as we were waiting for biopsy of the left iliac lesion. - Given the newly diagnosed metastatic adenocarcinoma, we would not be treating multiple myeloma at this time.  3.   Sigmoid diverticulitis: - CT scan done at [facility] showed diverticulitis.  He is currently on IV antibiotics.  He would like to be treated until resolution of diverticulitis.  All questions were answered. The patient knows to call the clinic with any problems, questions or concerns. We can certainly see the patient much sooner if necessary.   Derek Jack

## 2019-02-12 NOTE — Plan of Care (Signed)
  Problem: Nutrition: Goal: Adequate nutrition will be maintained Outcome: Progressing   Problem: Coping: Goal: Level of anxiety will decrease Outcome: Progressing   Problem: Pain Managment: Goal: General experience of comfort will improve Outcome: Progressing   Problem: Skin Integrity: Goal: Risk for impaired skin integrity will decrease Outcome: Progressing

## 2019-02-12 NOTE — Progress Notes (Signed)
PROGRESS NOTE    Randy Fitzpatrick.  CVE:[id number] DOB: 24-Feb-1925 DOA: 02/11/2019 PCP: Carolin Guernsey, MD    Brief Narrative:  85 year old male with a history of multiple myeloma chronic kidney disease stage V, admitted to the hospital with abdominal pain, vomiting and diarrhea.  Found to have acute sigmoid diverticulitis at outside hospital and transferred to [facility] for further management.  He was started on intravenous antibiotics.  He recently underwent left iliac biopsy which was positive for metastatic adenocarcinoma.  Seen by oncology with recommendations for transition to comfort measures.   Assessment & Plan:   Active Problems:   Type 2 diabetes mellitus with complication, without long-term current use of insulin (HCC)   Hypothyroidism (acquired)   CKD (chronic kidney disease), stage V (HCC)   Multiple myeloma (HCC)   Diverticulitis   Metastatic adenocarcinoma ([place])   1. Acute sigmoid diverticulitis.  CT scan done at outside hospital.  Currently on intravenous antibiotics.  Will for C. difficile found to be negative.  GI pathogen panel in process.  Advance diet as tolerated. 2. Chronic kidney disease stage V.  Creatinine has been trending up over the past 10 months.  He is chronically on Lasix at home which is currently on hold.  He did receive IV fluids on admission.  With multiple comorbidities and advanced age, does not appear to be a candidate for dialysis. 3. Multiple myeloma.  Followed by Dr. Delton Coombes.  Currently on chemotherapy. 4. Metastatic adenocarcinoma.  Patient recently underwent biopsy of left iliac bone and this was noted to be positive on PET scan.  Results confirmed metastatic adenocarcinoma.  He was seen by oncology who felt that he was not a candidate for any further aggressive treatment.  Goals of care were discussed with the patient's daughter she agreed to focus of care towards comfort. 5. Anion gap metabolic acidosis.  Likely related to dehydration  from diarrhea.  Started on lactated Ringer's.  Overall serum bicarbonate has improved.  Since he has lower extremity edema, will hold off on further fluids. 6. Chronic combined CHF.  Patient appears to have chronic lower extremity edema.  Ejection fraction of 40% with grade 2 diastolic dysfunction on last echocardiogram in 2019.  Chronically on 20 mg of Lasix which is currently on hold due to elevated creatinine. 7. Diabetes.  He is not on any medications.  Continue to follow serial blood sugars.   DVT prophylaxis: Heparin Code Status: DNR Family Communication: Discussed with daughter 8/19 Disposition Plan: Daughter is requesting discharge to nursing facility in [place]   Consultants:   Oncology  Procedures:     Antimicrobials:   Ceftriaxone 8/18 >  Flagyl 8/18 >   Subjective: Continues to have abdominal discomfort, nausea, diarrhea  Objective: Vitals:   02/11/19 2145 02/12/19 0511 02/12/19 0835 02/12/19 1327  BP: 124/68 (!) 95/56  127/72  Pulse: 94 81  83  Resp: '18 16  18  ' Temp:  98.1 F (36.7 C)  (!) 97.4 F (36.3 C)  TempSrc:  Oral  Oral  SpO2: 100% 98% 97% 99%  Weight: 63.3 kg     Height: '5\' 10"'  (1.778 m)       Intake/Output Summary (Last 24 hours) at 02/12/2019 1927 Last data filed at 02/12/2019 1502 Gross per 24 hour  Intake 1323.59 ml  Output 0 ml  Net 1323.59 ml   Filed Weights   02/11/19 2145  Weight: 63.3 kg    Examination:  General exam: Appears uncomfortable, very hard  of hearing Respiratory system: Clear to auscultation. Respiratory effort normal. Cardiovascular system: S1 & S2 heard, RRR. No JVD, murmurs, rubs, gallops or clicks. 2+ pedal edema. Gastrointestinal system: Abdomen is nondistended, soft and nontender. No organomegaly or masses felt. Normal bowel sounds heard. Central nervous system:  No focal neurological deficits. Extremities: Symmetric 5 x 5 power. Skin: No rashes, lesions or ulcers Psychiatry: Difficult to comprehend  due to dysarthria    Data Reviewed: I have personally reviewed following labs and imaging studies  CBC: Recent Labs  Lab 02/12/19 0518  WBC 5.7  HGB 9.5*  HCT 32.9*  MCV 100.3*  PLT 282   Basic Metabolic Panel: Recent Labs  Lab 02/11/19 2326 02/12/19 0518  NA 134* 137  K 4.9 4.9  CL 109 109  CO2 12* 17*  GLUCOSE 75 80  BUN 91* 92*  CREATININE 5.00* 5.03*  CALCIUM 8.2* 8.2*   GFR: Estimated Creatinine Clearance: 8.4 mL/min (A) (by C-G formula based on SCr of 5.03 mg/dL (H)). Liver Function Tests: No results for input(s): AST, ALT, ALKPHOS, BILITOT, PROT, ALBUMIN in the last 168 hours. No results for input(s): LIPASE, AMYLASE in the last 168 hours. No results for input(s): AMMONIA in the last 168 hours. Coagulation Profile: No results for input(s): INR, PROTIME in the last 168 hours. Cardiac Enzymes: No results for input(s): CKTOTAL, CKMB, CKMBINDEX, TROPONINI in the last 168 hours. BNP (last 3 results) No results for input(s): PROBNP in the last 8760 hours. HbA1C: Recent Labs    02/12/19 0518  HGBA1C 5.7*   CBG: Recent Labs  Lab 02/11/19 2331 02/12/19 0736 02/12/19 0900  GLUCAP 68* 65* 78   Lipid Profile: No results for input(s): CHOL, HDL, LDLCALC, TRIG, CHOLHDL, LDLDIRECT in the last 72 hours. Thyroid Function Tests: No results for input(s): TSH, T4TOTAL, FREET4, T3FREE, THYROIDAB in the last 72 hours. Anemia Panel: No results for input(s): VITAMINB12, FOLATE, FERRITIN, TIBC, IRON, RETICCTPCT in the last 72 hours. Sepsis Labs: No results for input(s): PROCALCITON, LATICACIDVEN in the last 168 hours.  Recent Results (from the past 240 hour(s))  C difficile quick scan w PCR reflex     Status: None   Collection Time: 02/11/19  9:40 PM   Specimen: STOOL  Result Value Ref Range Status   C Diff antigen NEGATIVE NEGATIVE Final   C Diff toxin NEGATIVE NEGATIVE Final   C Diff interpretation No C. difficile detected.  Final    Comment: Performed at  [facility], [address] [place], [place] [address]  SARS Coronavirus 2 [facility] order, Performed in [facility] hospital lab) Nasopharyngeal STOOL     Status: None   Collection Time: 02/11/19  9:41 PM   Specimen: STOOL; Nasopharyngeal  Result Value Ref Range Status   SARS Coronavirus 2 NEGATIVE NEGATIVE Final    Comment: (NOTE) If result is NEGATIVE SARS-CoV-2 target nucleic acids are NOT DETECTED. The SARS-CoV-2 RNA is generally detectable in upper and lower  respiratory specimens during the acute phase of infection. The lowest  concentration of SARS-CoV-2 viral copies this assay can detect is 250  copies / mL. A negative result does not preclude SARS-CoV-2 infection  and should not be used as the sole basis for treatment or other  patient management decisions.  A negative result may occur with  improper specimen collection / handling, submission of specimen other  than nasopharyngeal swab, presence of viral mutation(s) within the  areas targeted by this assay, and inadequate number of viral copies  (<250 copies /  mL). A negative result must be combined with clinical  observations, patient history, and epidemiological information. If result is POSITIVE SARS-CoV-2 target nucleic acids are DETECTED. The SARS-CoV-2 RNA is generally detectable in upper and lower  respiratory specimens dur ing the acute phase of infection.  Positive  results are indicative of active infection with SARS-CoV-2.  Clinical  correlation with patient history and other diagnostic information is  necessary to determine patient infection status.  Positive results do  not rule out bacterial infection or co-infection with other viruses. If result is PRESUMPTIVE POSTIVE SARS-CoV-2 nucleic acids MAY BE PRESENT.   A presumptive positive result was obtained on the submitted specimen  and confirmed on repeat testing.  While 2019 novel coronavirus  (SARS-CoV-2) nucleic acids may be present in the submitted sample   additional confirmatory testing may be necessary for epidemiological  and / or clinical management purposes  to differentiate between  SARS-CoV-2 and other Sarbecovirus currently known to infect humans.  If clinically indicated additional testing with an alternate test  methodology [phone]) is advised. The SARS-CoV-2 RNA is generally  detectable in upper and lower respiratory sp ecimens during the acute  phase of infection. The expected result is Negative. Fact Sheet for Patients:  StrictlyIdeas.no Fact Sheet for Healthcare Providers: BankingDealers.co.za This test is not yet approved or cleared by the [place] FDA and has been authorized for detection and/or diagnosis of SARS-CoV-2 by FDA under an Emergency Use Authorization (EUA).  This EUA will remain in effect (meaning this test can be used) for the duration of the COVID-19 declaration under Section 564(b)(1) of the Act, 21 U.S.C. section 360bbb-3(b)(1), unless the authorization is terminated or revoked sooner. Performed at [facility], [address], [place], [place] [address]          Radiology Studies: No results found.      Scheduled Meds: . aspirin EC  81 mg Oral Daily  . clopidogrel  75 mg Oral Daily  . heparin  5,000 Units Subcutaneous Q8H   Continuous Infusions: . sodium chloride 250 mL (02/12/19 1454)  . cefTRIAXone (ROCEPHIN)  IV 2 g (02/11/19 2341)  . metronidazole 500 mg (02/12/19 1457)     LOS: 1 day    Time spent: 58mns    JKathie Dike MD [facility]   If 7PM-7AM, please contact night-coverage [email or web address]  02/12/2019, 7:27 PM

## 2019-02-12 NOTE — Progress Notes (Signed)
Patient has not voided during day or during night so far.  Patient has no urge to void or is complaining of pain.  Bladder scan performed on patient. Showed less than 200 cc of urine.  Notified mid-level.  Received order to I&O patient if needed.  Will continue to monitor patient.

## 2019-02-12 NOTE — Progress Notes (Signed)
Patient received to room 301 via Veronia Beets. EMS from [facility]. Report received from [facility]. VSS, IV to left AC intact and working. MD in to see patient.

## 2019-02-13 LAB — GASTROINTESTINAL PANEL BY PCR, STOOL (REPLACES STOOL CULTURE)

## 2019-02-13 LAB — GLUCOSE, CAPILLARY
Glucose-Capillary: 84 mg/dL (ref 70–99)
Glucose-Capillary: 90 mg/dL (ref 70–99)

## 2019-02-13 MED ORDER — AZITHROMYCIN 250 MG PO TABS
500.0000 mg | ORAL_TABLET | Freq: Every day | ORAL | Status: AC
  Administered 2019-02-13 – 2019-02-15 (×3): 500 mg via ORAL
  Filled 2019-02-13 (×3): qty 2

## 2019-02-13 MED ORDER — LOPERAMIDE HCL 2 MG PO CAPS
2.0000 mg | ORAL_CAPSULE | Freq: Two times a day (BID) | ORAL | Status: DC
  Administered 2019-02-13 – 2019-02-14 (×3): 2 mg via ORAL
  Filled 2019-02-13 (×3): qty 1

## 2019-02-13 MED ORDER — SODIUM CHLORIDE 0.9 % IV SOLN
INTRAVENOUS | Status: DC
  Administered 2019-02-13: 17:00:00 via INTRAVENOUS

## 2019-02-13 NOTE — Progress Notes (Signed)
Patient I&O cath.  450cc of urine returned. Patient tolerated procedure well. Will continue to monitor patient.

## 2019-02-13 NOTE — Consult Note (Signed)
[facility] Oncology Progress Note  Name: Randy Fitzpatrick.      MRN: [id number]    Location: A301/A301-01  Date: 02/13/2019 Time:8:39 PM   Subjective: Interval History:Randy B Nadia Torr. is seen today for follow-up this evening.  Daughter is at the bedside.  Patient reportedly did not have any urine output.  He is being given fluid challenge.  He does have condom catheter.  Diarrhea is better today.  Patient however feels bad today.  He reports that he is feeling more weak.  No nausea or vomiting.  No fevers reported.  Objective: Vital signs in last 24 hours: Temp:  [98 F (36.7 C)-98.4 F (36.9 C)] 98 F (36.7 C) (08/20 1350) Pulse Rate:  [90-96] 90 (08/20 1350) Resp:  [16-18] 18 (08/20 1350) BP: (115-121)/(68-74) 121/72 (08/20 1350) SpO2:  [91 %-100 %] 91 % (08/20 2014)    Intake/Output from previous day: 08/19 0800 - 08/20 0759 In: 760.4 [P.O.:240; I.V.:412.2] Out: 450 [Urine:450]    Intake/Output this shift: Total I/O In: 1028.4 [P.O.:720; I.V.:84.6; IV Piggyback:223.8] Out: 400 [Stool:400]   PHYSICAL EXAM: BP 121/72 (BP Location: Right Arm)   Pulse 90   Temp 98 F (36.7 C) (Oral)   Resp 18   Ht _0  (1.778 m)   Wt 139 lb 8.8 oz (63.3 kg)   SpO2 91%   BMI 20.02 kg/m  General appearance: alert, cooperative and appears stated age Lungs: clear to auscultation bilaterally Heart: regular rate and rhythm Abdomen: soft, non-tender; bowel sounds normal; no masses,  no organomegaly Extremities: 1+ edema bilaterally. Skin: Skin color, texture, turgor normal. No rashes or lesions Neurologic: Grossly normal   Studies/Results: Results for orders placed or performed during the hospital encounter of 02/11/19 (from the past 48 hour(s))  C difficile quick scan w PCR reflex     Status: None   Collection Time: 02/11/19  9:40 PM   Specimen: STOOL  Result Value Ref Range   C Diff antigen NEGATIVE NEGATIVE   C Diff toxin NEGATIVE NEGATIVE   C Diff interpretation  No C. difficile detected.     Comment: Performed at [facility], [address], [place], [place] [address]  SARS Coronavirus 2 [facility] order, Performed in [facility] hospital lab) Nasopharyngeal STOOL     Status: None   Collection Time: 02/11/19  9:41 PM   Specimen: STOOL; Nasopharyngeal  Result Value Ref Range   SARS Coronavirus 2 NEGATIVE NEGATIVE    Comment: (NOTE) If result is NEGATIVE SARS-CoV-2 target nucleic acids are NOT DETECTED. The SARS-CoV-2 RNA is generally detectable in upper and lower  respiratory specimens during the acute phase of infection. The lowest  concentration of SARS-CoV-2 viral copies this assay can detect is 250  copies / mL. A negative result does not preclude SARS-CoV-2 infection  and should not be used as the sole basis for treatment or other  patient management decisions.  A negative result may occur with  improper specimen collection / handling, submission of specimen other  than nasopharyngeal swab, presence of viral mutation(s) within the  areas targeted by this assay, and inadequate number of viral copies  (<250 copies / mL). A negative result must be combined with clinical  observations, patient history, and epidemiological information. If result is POSITIVE SARS-CoV-2 target nucleic acids are DETECTED. The SARS-CoV-2 RNA is generally detectable in upper and lower  respiratory specimens dur ing the acute phase of infection.  Positive  results are indicative of active infection with SARS-CoV-2.  Clinical  correlation with patient history and other diagnostic information is  necessary to determine patient infection status.  Positive results do  not rule out bacterial infection or co-infection with other viruses. If result is PRESUMPTIVE POSTIVE SARS-CoV-2 nucleic acids MAY BE PRESENT.   A presumptive positive result was obtained on the submitted specimen  and confirmed on repeat testing.  While 2019 novel coronavirus  (SARS-CoV-2) nucleic acids  may be present in the submitted sample  additional confirmatory testing may be necessary for epidemiological  and / or clinical management purposes  to differentiate between  SARS-CoV-2 and other Sarbecovirus currently known to infect humans.  If clinically indicated additional testing with an alternate test  methodology [phone]) is advised. The SARS-CoV-2 RNA is generally  detectable in upper and lower respiratory sp ecimens during the acute  phase of infection. The expected result is Negative. Fact Sheet for Patients:  StrictlyIdeas.no Fact Sheet for Healthcare Providers: BankingDealers.co.za This test is not yet approved or cleared by the [place] FDA and has been authorized for detection and/or diagnosis of SARS-CoV-2 by FDA under an Emergency Use Authorization (EUA).  This EUA will remain in effect (meaning this test can be used) for the duration of the COVID-19 declaration under Section 564(b)(1) of the Act, 21 U.S.C. section 360bbb-3(b)(1), unless the authorization is terminated or revoked sooner. Performed at [facility], [address], [place], [place] [address]   Gastrointestinal Panel by PCR , Stool     Status: Abnormal   Collection Time: 02/11/19 10:34 PM   Specimen: STOOL  Result Value Ref Range   Campylobacter species DETECTED (A) NOT DETECTED    Comment: RESULT CALLED TO, READ BACK BY AND VERIFIED WITH: [facility] MILLS AT 1802 02/13/2019  TFK    Plesimonas shigelloides NOT DETECTED NOT DETECTED   Salmonella species NOT DETECTED NOT DETECTED   Yersinia enterocolitica NOT DETECTED NOT DETECTED   Vibrio species NOT DETECTED NOT DETECTED   Vibrio cholerae NOT DETECTED NOT DETECTED   Enteroaggregative E coli (EAEC) NOT DETECTED NOT DETECTED   Enteropathogenic E coli (EPEC) NOT DETECTED NOT DETECTED   Enterotoxigenic E coli (ETEC) NOT DETECTED NOT DETECTED   Shiga like toxin producing E coli (STEC) NOT DETECTED NOT  DETECTED   Shigella/Enteroinvasive E coli (EIEC) NOT DETECTED NOT DETECTED   Cryptosporidium NOT DETECTED NOT DETECTED   Cyclospora cayetanensis NOT DETECTED NOT DETECTED   Entamoeba histolytica NOT DETECTED NOT DETECTED   Giardia lamblia NOT DETECTED NOT DETECTED   Adenovirus F40/41 NOT DETECTED NOT DETECTED   Astrovirus NOT DETECTED NOT DETECTED   Norovirus GI/GII NOT DETECTED NOT DETECTED   Rotavirus A NOT DETECTED NOT DETECTED   Sapovirus (I, II, IV, and V) NOT DETECTED NOT DETECTED    Comment: Performed at [facility], [address] [place], [place] [address]  Basic metabolic panel     Status: Abnormal   Collection Time: 02/11/19 11:26 PM  Result Value Ref Range   Sodium 134 (L) 135 - 145 mmol/L   Potassium 4.9 3.5 - 5.1 mmol/L   Chloride 109 98 - 111 mmol/L   CO2 12 (L) 22 - 32 mmol/L   Glucose, Bld 75 70 - 99 mg/dL   BUN 91 (H) 8 - 23 mg/dL   Creatinine, Ser 5.00 (H) 0.61 - 1.24 mg/dL   Calcium 8.2 (L) 8.9 - 10.3 mg/dL   GFR calc non Af Amer 9 (L) >60 mL/min   GFR calc Af Amer 11 (L) >60 mL/min  Anion gap 13 5 - 15    Comment: Performed at [facility], [address], [place], [place] [address]  Glucose, capillary     Status: Abnormal   Collection Time: 02/11/19 11:31 PM  Result Value Ref Range   Glucose-Capillary 68 (L) 70 - 99 mg/dL  Hemoglobin A1c     Status: Abnormal   Collection Time: 02/12/19  5:18 AM  Result Value Ref Range   Hgb A1c MFr Bld 5.7 (H) 4.8 - 5.6 %    Comment: (NOTE) Pre diabetes:          5.7%-6.4% Diabetes:              >6.4% Glycemic control for   <7.0% adults with diabetes    Mean Plasma Glucose 116.89 mg/dL    Comment: Performed at [facility] [address], [place], [place] [address]  Basic metabolic panel     Status: Abnormal   Collection Time: 02/12/19  5:18 AM  Result Value Ref Range   Sodium 137 135 - 145 mmol/L   Potassium 4.9 3.5 - 5.1 mmol/L   Chloride 109 98 - 111 mmol/L   CO2 17 (L) 22 - 32 mmol/L    Glucose, Bld 80 70 - 99 mg/dL   BUN 92 (H) 8 - 23 mg/dL   Creatinine, Ser 5.03 (H) 0.61 - 1.24 mg/dL   Calcium 8.2 (L) 8.9 - 10.3 mg/dL   GFR calc non Af Amer 9 (L) >60 mL/min   GFR calc Af Amer 11 (L) >60 mL/min   Anion gap 11 5 - 15    Comment: Performed at [facility], [address], [place], [place] [address]  CBC     Status: Abnormal   Collection Time: 02/12/19  5:18 AM  Result Value Ref Range   WBC 5.7 4.0 - 10.5 K/uL   RBC 3.28 (L) 4.22 - 5.81 MIL/uL   Hemoglobin 9.5 (L) 13.0 - 17.0 g/dL   HCT 32.9 (L) 39.0 - 52.0 %   MCV 100.3 (H) 80.0 - 100.0 fL   MCH 29.0 26.0 - 34.0 pg   MCHC 28.9 (L) 30.0 - 36.0 g/dL   RDW 17.7 (H) 11.5 - 15.5 %   Platelets 154 150 - 400 K/uL   nRBC 0.0 0.0 - 0.2 %    Comment: Performed at [facility], [address], [place],  [address]  Glucose, capillary     Status: Abnormal   Collection Time: 02/12/19  7:36 AM  Result Value Ref Range   Glucose-Capillary 65 (L) 70 - 99 mg/dL  Glucose, capillary     Status: None   Collection Time: 02/12/19  9:00 AM  Result Value Ref Range   Glucose-Capillary 78 70 - 99 mg/dL   Comment 1 Notify RN    Comment 2 Document in Chart   Glucose, capillary     Status: None   Collection Time: 02/13/19  6:48 AM  Result Value Ref Range   Glucose-Capillary 90 70 - 99 mg/dL  Glucose, capillary     Status: None   Collection Time: 02/13/19  7:07 AM  Result Value Ref Range   Glucose-Capillary 84 70 - 99 mg/dL   No results found.   MEDICATIONS: I have reviewed the patient's current medications.     Assessment/Plan:  1.  Metastatic adenocarcinoma to the bones, likely lung primary: - CT-guided biopsy of the left iliac bone lesion showed metastatic adenocarcinoma. - We discussed the poor prognosis.  I have strongly  recommended hospice.  Patient and daughter are agreeable. - They are in the process of finding a bed at [facility].  If they cannot find a bed, they would like to be sent to the local nursing homes in  the [place] area.  2.  Multiple myeloma: - This was diagnosed as part of work-up for his worsening kidney function. - He was not started on therapy for it. -This could be also contributing to his kidney dysfunction.  3.  Sigmoid diverticulitis: -CT scan done at [facility] showed diverticulitis. - He is currently on IV antibiotics.  He had some mild diarrhea today. -C. difficile was negative.  Campylobacter panel was positive.  He is being treated with azithromycin.  4.  Oliguria: - His urine output was low today.  He is given fluid challenge.  All questions were answered. The patient knows to call the clinic with any problems, questions or concerns. We can certainly see the patient much sooner if necessary.     Derek Jack

## 2019-02-13 NOTE — Progress Notes (Signed)
Notified MD Memon about GI PCR positive for campylobacter species. No new orders obtained; pt is covered with current antibiotics.

## 2019-02-13 NOTE — Progress Notes (Signed)
Nursing Home Choices ;   [place] [address] [phone]   Add [facility] My Favorites- [place] in a new window Too New to Rate45footnote Too New to Rate58footnote Too New to Rate6footnote Too New to Rate14footnote 2.2 [place] [address] [phone]   [place] My Favorites- [place] in a new window 5 out of 5 starsfootnote Much Above Average 5 out of 5 starsfootnote Much Above Average 4 out of 5 starsfootnote Above Average 3 out of 5 starsfootnote Average 2.4 [facility] [address] [place] [address] [phone]   [place] My Favorites- Opens in a new window 1 out of 5 starsfootnote Much Below Average 1 out of 5 starsfootnote Much Below Average 2 out of 5 starsfootnote Below Average 1 out of 5 starsfootnote Much Below Average 3.8 [facility] [address] [place] [address] [phone]   [place] My Favorites- Opens in a new window 4 out of 5 starsfootnote Above Average 4 out of 5 starsfootnote Above Average 3 out of 5 starsfootnote Average 3 out of 5 starsfootnote Average 7.5 [place], [place] [address] [phone]   [facility] My Favorites- Opens in a new window 3 out of 5 starsfootnote Average 3 out of 5 starsfootnote Average 2 out of 5 starsfootnote Below Average 3 out of 5 starsfootnote Average 18.5 [place] [address] [place], [place] [address] [phone]   Add [facility] My Favorites- Opens in a new window 4 out of 5 starsfootnote Above Average 4 out of 5  starsfootnote Above Average 2 out of 5 starsfootnote Below Average 4 out of 5 starsfootnote Above Average 20.3 [place] [address] [place] [address] [phone]   [place] My Favorites- Opens in a new window 5 out of 5 starsfootnote Much Above Average 4 out of 5 starsfootnote Above Average 2 out of 5 starsfootnote Below Average 5 out of 5 starsfootnote Much Above Average 23.7 [place] [address] [phone]   [place] My Favorites- Opens in a new window 3 out of 5 starsfootnote Average 2 out of 5 starsfootnote Below Average 2 out of 5 starsfootnote Below Average 5 out of 5 starsfootnote Much Above Average 24.5 [place] [address] [phone]   [place] My Favorites- Opens in a new window 4 out of 5 starsfootnote Above Average 4 out of 5 starsfootnote Above Average 3 out of 5 starsfootnote Average 3 out of 5 starsfootnote Average 28.9 [facility] [place], [place] [address] [phone]   Add [facility] My Favorites- Opens in a new window 1 out of 5 starsfootnote Much Below Average 2 out of 5 starsfootnote Below Average 1 out of 5 starsfootnote Much Below Average 3 out of 5 starsfootnote Average 30.6 [place] [address] [phone]  [place] My Favorites- Opens in a new window 3 out of 5 starsfootnote Average 3 out of 5 starsfootnote Average 2 out of 5 starsfootnote Below Average 4 out of 5 starsfootnote Above Average 31.9 [place] [address] [place] [address] [phone]   [place] [facility] My Favorites- Opens in a new window 3 out of 5 starsfootnote Average 3 out of 5 starsfootnote Average 3 out of 5 starsfootnote Average 4 out of 5 starsfootnote Above Average 32.1 [facility] [address] [place] [address] [phone]   [place] My Favorites- Opens in a new window 5 out of 5 starsfootnote Much Above Average 4 out of 5 starsfootnote Above Average 2 out of 5 starsfootnote Below Average 5 out of 5 starsfootnote Much Above Average 32.6 [facility] [address] [place] [address] [phone]   Add [place] My Favorites- Opens in a new window 3 out of 5 starsfootnote Average 3 out of 5 starsfootnote Average 2 out of 5 starsfootnote Below Average 4 out of 5 starsfootnote Above Average 33.[address] [place] [address] [phone]   [place] My Favorites- Opens in a new window 3 out of 5 starsfootnote Average 2 out of 5 starsfootnote Below Average 4 out of 5 starsfootnote Above Average 3 out of 5 starsfootnote Average 33.5 [facility] [place], [place] [address] [phone]   Add [place] My Favorites- Opens in a new window 5 out of 5 starsfootnote Much Above Average 5 out of 5 starsfootnote Much Above Average 3 out of 5 starsfootnote Average 3 out of 5 starsfootnote Average 87.6 [place], [place] [address] [phone]   [place] My Favorites- Opens in a new window 2 out of 5 starsfootnote Below Average 2 out of 5 starsfootnote Below Average 2 out of 5 starsfootnote Below Average 2 out of 5  starsfootnote Below Average 33.7 [facility] [place], [place] [address] [phone] [place] My Favorites- Opens in a new window 4 out of 5 starsfootnote Above Average 4 out of 5 starsfootnote Above Average 3 out of 5 starsfootnote Average 3 out of 5 starsfootnote Average 36.5 [facility] [place], [place] [address] [phone]   Add [place] My Favorites- Opens in a new window 2 out of 5 starsfootnote Below Average 2 out of 5 starsfootnote Below Average 3 out of 5 starsfootnote Average 4 out of 5 starsfootnote Above Average 36.6 [facility] [address] [place] [address] [phone]   [place] My [place] in a new window 5 out of 5 starsfootnote Much Above Average 5 out of 5 starsfootnote Much Above

## 2019-02-13 NOTE — Progress Notes (Signed)
Has not voided with condom cath today. Scan showed 195.  Contacted Dr. Roderic Palau and he said he is going to start fluids and continue to monitor output.

## 2019-02-13 NOTE — TOC Initial Note (Signed)
Transition of Care (TOC) - Initial/Assessment Note    Patient Details  Name: Randy Fitzpatrick. MRN: [id number] Date of Birth: [birth year]  Transition of Care [facility]) CM/SW Contact:    Boneta Lucks, RN Phone Number: 02/13/2019, 1:16 PM  Clinical Narrative:   Patient admitted for Diverticulitis. Spoke with [name] - daughter. He lives alone, new diagnosis of metastatic Adenocarcinoma of the bone. After Discussion with Dr. Raliegh Ip. [name] is agreeable to Hospice.  Her mother lives a [facility] in [place]. She want her father to go there. CM called Tammy at [facility] her the information, however they have not accepted any new patient since [place]. They do work with hospice and will consider and let us know. Negative COVID test sent, she advised if they accept he will need a rapid before leaving the hospital.  Also spoke with Crystal  [phone] as requested by daughter.  TOC to follow.          Expected Discharge Plan: Long Term Nursing Home Barriers to Discharge: Continued Medical Work up   Patient Goals and CMS Choice Patient states their goals for this hospitalization and ongoing recovery are:: to go to nursing facility CMS Medicare.gov Compare Post Acute Care list provided to:: Patient Represenative (must comment)(daughter) Choice offered to / list presented to : Patient  Expected Discharge Plan and Services Expected Discharge Plan: [place]   Discharge Planning Services: CM Consult Post Acute Care Choice: Nursing Home     Prior Living Arrangements/Services   Lives with:: Self Patient language and need for interpreter reviewed:: Yes Do you feel safe going back to the place where you live?: No   Needs Hopsice  Need for Family Participation in Patient Care: Yes (Comment) Care giver support system in place?: Yes (comment) Current home services: DME Criminal Activity/Legal Involvement Pertinent to Current Situation/Hospitalization: No -  Comment as needed  Activities of Daily Living Home Assistive Devices/Equipment: Eyeglasses, Cane (specify quad or straight) ADL Screening (condition at time of admission) Patient's cognitive ability adequate to safely complete daily activities?: No Is the patient deaf or have difficulty hearing?: Yes Does the patient have difficulty seeing, even when wearing glasses/contacts?: No Does the patient have difficulty concentrating, remembering, or making decisions?: No Patient able to express need for assistance with ADLs?: Yes Does the patient have difficulty dressing or bathing?: No Independently performs ADLs?: Yes (appropriate for developmental age) Does the patient have difficulty walking or climbing stairs?: Yes Weakness of Legs: Both Weakness of Arms/Hands: Both  Permission Sought/Granted            Permission granted to share info w Relationship: [name]- daughter     Emotional Assessment       Orientation: : Oriented to Self      Admission diagnosis:  DIVERTICULITIS Patient Active Problem List   Diagnosis Date Noted  . Metastatic adenocarcinoma ([place]) 02/12/2019  . Diverticulitis 02/11/2019  . Multiple myeloma ([place]) 01/16/2019  . Goals of care, counseling/discussion 01/16/2019  . Multiple myeloma not having achieved remission ([place]) 01/16/2019  . CKD (chronic kidney disease), stage V ([place]) 10/16/2018  . MGUS (monoclonal gammopathy of unknown significance) 09/30/2018  . Normocytic anemia 09/30/2018  . Stage I pressure ulcer of buttock 04/03/2018  . Acute CHF (congestive heart failure) ([place]) 03/31/2018  . Hypothyroidism (acquired) 03/31/2018  . GERD (gastroesophageal reflux disease) 03/31/2018  . Congestive heart failure (CHF) ([place]) 03/31/2018  . Essential hypertension 11/23/2017  . Acute renal failure superimposed on stage 3  chronic kidney disease ([place]) 11/07/2017  . Coronary artery disease involving native coronary artery of native heart with unstable angina  pectoris ([place]) 11/05/2017  . Left main coronary artery disease 11/05/2017  . Hyperlipidemia with target LDL less than 70 11/05/2017  . Type 2 diabetes mellitus with complication, without long-term current use of insulin ([place]) 11/05/2017  . Moderate mitral regurgitation by prior echocardiogram 11/05/2017  . Pressure injury of skin 11/04/2017  . Acute ST elevation myocardial infarction (STEMI) of inferior wall ([place]) 11/03/2017  . Precordial pain 05/07/2012   PCP:  [name], [name], MD Pharmacy:   [place] [place] [address] [place] [address] Phone: [phone] Fax: [phone]       Readmission Risk Interventions No flowsheet data found.

## 2019-02-13 NOTE — NC FL2 (Signed)
[place] LEVEL OF CARE SCREENING TOOL     IDENTIFICATION  Patient Name: Randy Fitzpatrick. Birthdate: 1924/07/07 Sex: male Admission Date (Current Location): 02/11/2019  [place] and [place] Number:  [employer] and Address:  [facility] [address], [place]      Provider Number: [id number]  Attending Physician Name and Address:  Kathie Dike, MD  Relative Name and Phone Number:  Camelia Eng  [phone]    Current Level of Care: Hospital Recommended Level of Care: Nursing Facility(Hospice) Prior Approval Number:    Date Approved/Denied:   PASRR Number:    Discharge Plan: Domiciliary (Rest home)    Current Diagnoses: Patient Active Problem List   Diagnosis Date Noted  . Metastatic adenocarcinoma ([place]) 02/12/2019  . Diverticulitis 02/11/2019  . Multiple myeloma ([place]) 01/16/2019  . Goals of care, counseling/discussion 01/16/2019  . Multiple myeloma not having achieved remission ([place]) 01/16/2019  . CKD (chronic kidney disease), stage V ([place]) 10/16/2018  . MGUS (monoclonal gammopathy of unknown significance) 09/30/2018  . Normocytic anemia 09/30/2018  . Stage I pressure ulcer of buttock 04/03/2018  . Acute CHF (congestive heart failure) ([place]) 03/31/2018  . Hypothyroidism (acquired) 03/31/2018  . GERD (gastroesophageal reflux disease) 03/31/2018  . Congestive heart failure (CHF) ([place]) 03/31/2018  . Essential hypertension 11/23/2017  . Acute renal failure superimposed on stage 3 chronic kidney disease ([place]) 11/07/2017  . Coronary artery disease involving native coronary artery of native heart with unstable angina pectoris ([place]) 11/05/2017  . Left main coronary artery disease 11/05/2017  . Hyperlipidemia with target LDL less than 70 11/05/2017  . Type 2 diabetes mellitus with complication, without long-term current use of insulin ([place]) 11/05/2017  . Moderate mitral regurgitation by prior echocardiogram 11/05/2017   . Pressure injury of skin 11/04/2017  . Acute ST elevation myocardial infarction (STEMI) of inferior wall ([place]) 11/03/2017  . Precordial pain 05/07/2012    Orientation RESPIRATION BLADDER Height & Weight     Self  Normal External catheter Weight: 63.3 kg Height:  _0  (177.8 cm)  BEHAVIORAL SYMPTOMS/MOOD NEUROLOGICAL BOWEL NUTRITION STATUS      Continent Diet(Full Liquid)  AMBULATORY STATUS COMMUNICATION OF NEEDS Skin   Extensive Assist(uses Cane) Verbally Skin abrasions, Bruising(generalized to arms and legs)                       Personal Care Assistance Level of Assistance  Bathing, Feeding, Dressing Bathing Assistance: Limited assistance Feeding assistance: Limited assistance Dressing Assistance: Limited assistance     Functional Limitations Info  Sight, Hearing, Speech Sight Info: Adequate Hearing Info: Impaired Speech Info: Adequate    SPECIAL CARE FACTORS FREQUENCY                       Contractures Contractures Info: Present    Additional Factors Info  Code Status, Allergies Code Status Info: DNR Allergies Info: NKDA           Current Medications (02/13/2019):  This is the current hospital active medication list Current Facility-Administered Medications  Medication Dose Route Frequency Provider Last Rate Last Dose  . 0.9 %  sodium chloride infusion   Intravenous PRN Kathie Dike, MD 10 mL/hr at 02/13/19 0921 500 mL at 02/13/19 0921  . acetaminophen (TYLENOL) tablet 650 mg  650 mg Oral Q6H PRN Emokpae, Ejiroghene E, MD       Or  . acetaminophen (TYLENOL) suppository 650 mg  650  mg Rectal Q6H PRN Emokpae, Ejiroghene E, MD      . aspirin EC tablet 81 mg  81 mg Oral Daily Emokpae, Ejiroghene E, MD   81 mg at 02/12/19 1448  . cefTRIAXone (ROCEPHIN) 2 g in sodium chloride 0.9 % 100 mL IVPB  2 g Intravenous Q24H Emokpae, Ejiroghene E, MD 200 mL/hr at 02/13/19 0116 2 g at 02/13/19 0116  . clopidogrel (PLAVIX) tablet 75 mg  75 mg Oral Daily  Emokpae, Ejiroghene E, MD   75 mg at 02/12/19 1448  . heparin injection 5,000 Units  5,000 Units Subcutaneous Q8H Emokpae, Ejiroghene E, MD   5,000 Units at 02/12/19 0532  . metroNIDAZOLE (FLAGYL) IVPB 500 mg  500 mg Intravenous Q8H Emokpae, Ejiroghene E, MD 100 mL/hr at 02/13/19 0651 500 mg at 02/13/19 0651  . ondansetron (ZOFRAN) tablet 4 mg  4 mg Oral Q6H PRN Emokpae, Ejiroghene E, MD       Or  . ondansetron (ZOFRAN) injection 4 mg  4 mg Intravenous Q6H PRN Emokpae, Ejiroghene E, MD   4 mg at 02/13/19 0919  . polyethylene glycol (MIRALAX / GLYCOLAX) packet 17 g  17 g Oral Daily PRN Emokpae, Ejiroghene E, MD         Discharge Medications: Please see discharge summary for a list of discharge medications.  Relevant Imaging Results:  Relevant Lab Results:   Additional Information SS# [id number]  Boneta Lucks, RN

## 2019-02-13 NOTE — Progress Notes (Signed)
PROGRESS NOTE    Randy Fitzpatrick.  EQA:[id number] DOB: 02-16-1925 DOA: 02/11/2019 PCP: Carolin Guernsey, MD    Brief Narrative:  85 year old male with a history of multiple myeloma chronic kidney disease stage V, admitted to the hospital with abdominal pain, vomiting and diarrhea.  Found to have acute sigmoid diverticulitis at outside hospital and transferred to [facility] for further management.  He was started on intravenous antibiotics.  He recently underwent left iliac biopsy which was positive for metastatic adenocarcinoma.  Seen by oncology with recommendations for transition to comfort measures.   Assessment & Plan:   Active Problems:   Type 2 diabetes mellitus with complication, without long-term current use of insulin (HCC)   Hypothyroidism (acquired)   CKD (chronic kidney disease), stage V (HCC)   Multiple myeloma (HCC)   Diverticulitis   Metastatic adenocarcinoma ([place])   1. Acute sigmoid diverticulitis.  CT scan done at outside hospital.  Currently on intravenous antibiotics.  Stool for C. difficile found to be negative.  GI pathogen panel positive for Campylobacter.  Will treat with a 3-day course of azithromycin.  Advance diet as tolerated.  He is still having significant diarrhea. 2. Chronic kidney disease stage V.  Creatinine has been trending up over the past 10 months.  He is chronically on Lasix at home which is currently on hold.  He did receive IV fluids on admission.  With multiple comorbidities and advanced age, does not appear to be a candidate for dialysis. 3. Multiple myeloma.  Followed by Dr. Delton Coombes.  Currently on chemotherapy. 4. Metastatic adenocarcinoma.  Patient recently underwent biopsy of left iliac bone and this was noted to be positive on PET scan.  Results confirmed metastatic adenocarcinoma.  He was seen by oncology who felt that he was not a candidate for any further aggressive treatment.  Goals of care were discussed with the patient's daughter  she agreed to focus of care towards comfort. 5. Anion gap metabolic acidosis.  Likely related to dehydration from diarrhea.  Started on lactated Ringer's.  Overall serum bicarbonate has improved.  Recheck in a.m. 6. Chronic combined CHF.  Patient appears to have chronic lower extremity edema.  Ejection fraction of 40% with grade 2 diastolic dysfunction on last echocardiogram in 2019.  Chronically on 20 mg of Lasix which is currently on hold due to elevated creatinine. 7. Diabetes.  He is not on any medications.  Continue to follow serial blood sugars.   DVT prophylaxis: Heparin Code Status: DNR Family Communication: Discussed with daughter 8/20 Disposition Plan: Daughter is requesting discharge to nursing facility in [place]   Consultants:   Oncology  Procedures:     Antimicrobials:   Ceftriaxone 8/18 >  Flagyl 8/18 >   Subjective: Describes having diarrhea.  Denies any vomiting or abdominal pain.  Feels generally weak and tired.  Objective: Vitals:   02/12/19 1925 02/12/19 2049 02/13/19 0500 02/13/19 1350  BP:  115/74 120/68 121/72  Pulse:  92 96 90  Resp:  _0 Temp:  98 F (36.7 C) 98.4 F (36.9 C) 98 F (36.7 C)  TempSrc:  Axillary Oral Oral  SpO2: 94% 100% 96% 96%  Weight:      Height:        Intake/Output Summary (Last 24 hours) at 02/13/2019 1956 Last data filed at 02/13/2019 1808 Gross per 24 hour  Intake 1028.43 ml  Output 850 ml  Net 178.43 ml   Filed Weights   02/11/19 2145  Weight: 63.3 kg    Examination:  General exam: Alert, awake, oriented x 3 Respiratory system: Clear to auscultation. Respiratory effort normal. Cardiovascular system:RRR. No murmurs, rubs, gallops. Gastrointestinal system: Abdomen is nondistended, soft and nontender. No organomegaly or masses felt. Normal bowel sounds heard. Central nervous system: Alert and oriented. No focal neurological deficits. Extremities: 1+ edema bilaterally Skin: No rashes, lesions or  ulcers Psychiatry: Judgement and insight appear normal. Mood & affect appropriate.      Data Reviewed: I have personally reviewed following labs and imaging studies  CBC: Recent Labs  Lab 02/12/19 0518  WBC 5.7  HGB 9.5*  HCT 32.9*  MCV 100.3*  PLT 267   Basic Metabolic Panel: Recent Labs  Lab 02/11/19 2326 02/12/19 0518  NA 134* 137  K 4.9 4.9  CL 109 109  CO2 12* 17*  GLUCOSE 75 80  BUN 91* 92*  CREATININE 5.00* 5.03*  CALCIUM 8.2* 8.2*   GFR: Estimated Creatinine Clearance: 8.4 mL/min (A) (by C-G formula based on SCr of 5.03 mg/dL (H)). Liver Function Tests: No results for input(s): AST, ALT, ALKPHOS, BILITOT, PROT, ALBUMIN in the last 168 hours. No results for input(s): LIPASE, AMYLASE in the last 168 hours. No results for input(s): AMMONIA in the last 168 hours. Coagulation Profile: No results for input(s): INR, PROTIME in the last 168 hours. Cardiac Enzymes: No results for input(s): CKTOTAL, CKMB, CKMBINDEX, TROPONINI in the last 168 hours. BNP (last 3 results) No results for input(s): PROBNP in the last 8760 hours. HbA1C: Recent Labs    02/12/19 0518  HGBA1C 5.7*   CBG: Recent Labs  Lab 02/11/19 2331 02/12/19 0736 02/12/19 0900 02/13/19 0648 02/13/19 0707  GLUCAP 68* 65* 78 90 84   Lipid Profile: No results for input(s): CHOL, HDL, LDLCALC, TRIG, CHOLHDL, LDLDIRECT in the last 72 hours. Thyroid Function Tests: No results for input(s): TSH, T4TOTAL, FREET4, T3FREE, THYROIDAB in the last 72 hours. Anemia Panel: No results for input(s): VITAMINB12, FOLATE, FERRITIN, TIBC, IRON, RETICCTPCT in the last 72 hours. Sepsis Labs: No results for input(s): PROCALCITON, LATICACIDVEN in the last 168 hours.  Recent Results (from the past 240 hour(s))  C difficile quick scan w PCR reflex     Status: None   Collection Time: 02/11/19  9:40 PM   Specimen: STOOL  Result Value Ref Range Status   C Diff antigen NEGATIVE NEGATIVE Final   C Diff toxin  NEGATIVE NEGATIVE Final   C Diff interpretation No C. difficile detected.  Final    Comment: Performed at [facility], [address], [place], [place] [address]  SARS Coronavirus 2 [facility] order, Performed in [facility] hospital lab) Nasopharyngeal STOOL     Status: None   Collection Time: 02/11/19  9:41 PM   Specimen: STOOL; Nasopharyngeal  Result Value Ref Range Status   SARS Coronavirus 2 NEGATIVE NEGATIVE Final    Comment: (NOTE) If result is NEGATIVE SARS-CoV-2 target nucleic acids are NOT DETECTED. The SARS-CoV-2 RNA is generally detectable in upper and lower  respiratory specimens during the acute phase of infection. The lowest  concentration of SARS-CoV-2 viral copies this assay can detect is 250  copies / mL. A negative result does not preclude SARS-CoV-2 infection  and should not be used as the sole basis for treatment or other  patient management decisions.  A negative result may occur with  improper specimen collection / handling, submission of specimen other  than nasopharyngeal swab, presence of viral mutation(s) within the  areas  targeted by this assay, and inadequate number of viral copies  (<250 copies / mL). A negative result must be combined with clinical  observations, patient history, and epidemiological information. If result is POSITIVE SARS-CoV-2 target nucleic acids are DETECTED. The SARS-CoV-2 RNA is generally detectable in upper and lower  respiratory specimens dur ing the acute phase of infection.  Positive  results are indicative of active infection with SARS-CoV-2.  Clinical  correlation with patient history and other diagnostic information is  necessary to determine patient infection status.  Positive results do  not rule out bacterial infection or co-infection with other viruses. If result is PRESUMPTIVE POSTIVE SARS-CoV-2 nucleic acids MAY BE PRESENT.   A presumptive positive result was obtained on the submitted specimen  and confirmed on repeat  testing.  While 2019 novel coronavirus  (SARS-CoV-2) nucleic acids may be present in the submitted sample  additional confirmatory testing may be necessary for epidemiological  and / or clinical management purposes  to differentiate between  SARS-CoV-2 and other Sarbecovirus currently known to infect humans.  If clinically indicated additional testing with an alternate test  methodology [phone]) is advised. The SARS-CoV-2 RNA is generally  detectable in upper and lower respiratory sp ecimens during the acute  phase of infection. The expected result is Negative. Fact Sheet for Patients:  StrictlyIdeas.no Fact Sheet for Healthcare Providers: BankingDealers.co.za This test is not yet approved or cleared by the [place] FDA and has been authorized for detection and/or diagnosis of SARS-CoV-2 by FDA under an Emergency Use Authorization (EUA).  This EUA will remain in effect (meaning this test can be used) for the duration of the COVID-19 declaration under Section 564(b)(1) of the Act, 21 U.S.C. section 360bbb-3(b)(1), unless the authorization is terminated or revoked sooner. Performed at [facility], [address], [place], [place] [address]   Gastrointestinal Panel by PCR , Stool     Status: Abnormal   Collection Time: 02/11/19 10:34 PM   Specimen: STOOL  Result Value Ref Range Status   Campylobacter species DETECTED (A) NOT DETECTED Final    Comment: RESULT CALLED TO, READ BACK BY AND VERIFIED WITH: MARY MILLS AT 1802 02/13/2019  TFK    Plesimonas shigelloides NOT DETECTED NOT DETECTED Final   Salmonella species NOT DETECTED NOT DETECTED Final   Yersinia enterocolitica NOT DETECTED NOT DETECTED Final   Vibrio species NOT DETECTED NOT DETECTED Final   Vibrio cholerae NOT DETECTED NOT DETECTED Final   Enteroaggregative E coli (EAEC) NOT DETECTED NOT DETECTED Final   Enteropathogenic E coli (EPEC) NOT DETECTED NOT DETECTED Final    Enterotoxigenic E coli (ETEC) NOT DETECTED NOT DETECTED Final   Shiga like toxin producing E coli (STEC) NOT DETECTED NOT DETECTED Final   Shigella/Enteroinvasive E coli (EIEC) NOT DETECTED NOT DETECTED Final   Cryptosporidium NOT DETECTED NOT DETECTED Final   Cyclospora cayetanensis NOT DETECTED NOT DETECTED Final   Entamoeba histolytica NOT DETECTED NOT DETECTED Final   Giardia lamblia NOT DETECTED NOT DETECTED Final   Adenovirus F40/41 NOT DETECTED NOT DETECTED Final   Astrovirus NOT DETECTED NOT DETECTED Final   Norovirus GI/GII NOT DETECTED NOT DETECTED Final   Rotavirus A NOT DETECTED NOT DETECTED Final   Sapovirus (I, II, IV, and V) NOT DETECTED NOT DETECTED Final    Comment: Performed at [facility], [address] [place], [place] [address]         Radiology Studies: No results found.      Scheduled Meds: . aspirin  EC  81 mg Oral Daily  . azithromycin  500 mg Oral Daily  . clopidogrel  75 mg Oral Daily  . heparin  5,000 Units Subcutaneous Q8H  . loperamide  2 mg Oral BID   Continuous Infusions: . sodium chloride 10 mL/hr at 02/13/19 1600  . sodium chloride 75 mL/hr at 02/13/19 1702  . cefTRIAXone (ROCEPHIN)  IV Stopped (02/13/19 0146)     LOS: 2 days    Time spent: 63mns    JKathie Dike MD [facility]   If 7PM-7AM, please contact night-coverage [email or web address]  02/13/2019, 7:56 PM

## 2019-02-14 LAB — BASIC METABOLIC PANEL
Anion gap: 12 (ref 5–15)
BUN: 83 mg/dL — ABNORMAL HIGH (ref 8–23)
CO2: 14 mmol/L — ABNORMAL LOW (ref 22–32)
Calcium: 8.2 mg/dL — ABNORMAL LOW (ref 8.9–10.3)
Chloride: 112 mmol/L — ABNORMAL HIGH (ref 98–111)
Creatinine, Ser: 4.84 mg/dL — ABNORMAL HIGH (ref 0.61–1.24)
GFR calc Af Amer: 11 mL/min — ABNORMAL LOW (ref >60)
GFR calc non Af Amer: 10 mL/min — ABNORMAL LOW (ref >60)
Glucose, Bld: 90 mg/dL (ref 70–99)
Potassium: 5 mmol/L (ref 3.5–5.1)
Sodium: 138 mmol/L (ref 135–145)

## 2019-02-14 LAB — GLUCOSE, CAPILLARY: Glucose-Capillary: 78 mg/dL (ref 70–99)

## 2019-02-14 MED ORDER — TAMSULOSIN HCL 0.4 MG PO CAPS
0.4000 mg | ORAL_CAPSULE | Freq: Every day | ORAL | Status: DC
  Administered 2019-02-14 – 2019-02-16 (×3): 0.4 mg via ORAL
  Filled 2019-02-14 (×3): qty 1

## 2019-02-14 MED ORDER — LOPERAMIDE HCL 2 MG PO CAPS: 2.0000 mg | ORAL_CAPSULE | Freq: Two times a day (BID) | ORAL | Status: DC | PRN

## 2019-02-14 MED ORDER — SODIUM BICARBONATE 650 MG PO TABS
650.0000 mg | ORAL_TABLET | Freq: Two times a day (BID) | ORAL | Status: DC
  Administered 2019-02-14 – 2019-02-17 (×6): 650 mg via ORAL
  Filled 2019-02-14 (×6): qty 1

## 2019-02-14 NOTE — Care Management Important Message (Signed)
Important Message  Patient Details  Name: Randy Fitzpatrick. MRN: [id number] Date of Birth: 09-Dec-1924   Medicare Important Message Given:  Yes     Tommy Medal 02/14/2019, 2:48 PM

## 2019-02-14 NOTE — Progress Notes (Addendum)
Bladder scanned at 1600 after no urine output and showed > 200.  Dr. Roderic Palau ordered in n out and cath returned 700 mls. New condom cath applied.  As of 1600 flexiseal had put out 200 in 24 hour period.  Stool in tubing still liquid.  New bag at [employer] [facility]

## 2019-02-14 NOTE — TOC Progression Note (Signed)
Transition of Care (TOC) - Progression Note    Patient Details  Name: Randy Fitzpatrick. MRN: [id number] Date of Birth: 05/29/1925  Transition of [facility]) CM/SW Contact  Shade Flood, LCSW Phone Number: 02/14/2019, 1:41 PM  Clinical Narrative:     TOC following. Spoke with pt's daughter to further discuss pt's NH placement needs. Daughter states that she talked to Tammy at [facility] and that she was told that they could review pt for placement under their own comfort care but not with hospice services due to [place] visitor restrictions. Daughter is aware that it would be private pay. She states she would like to pursue this option. Made referral and spoke with Kerri at [facility]. They are reviewing pt and have determined that they cannot take him today due to timing/pt's care needs and weekend approaching. They are willing to review him again on Monday and if they agree to take him, they will likely be able to have him transferred Monday. Updated MD and daughter. TOC will follow up Monday.  Expected Discharge Plan: Long Term Nursing Home Barriers to Discharge: Continued Medical Work up  Expected Discharge Plan and Services Expected Discharge Plan: [place]   Discharge Planning Services: CM Consult Post Acute Care Choice: Nursing Home                                         Social Determinants of Health (SDOH) Interventions    Readmission Risk Interventions No flowsheet data found.

## 2019-02-14 NOTE — Progress Notes (Addendum)
PROGRESS NOTE    Randy Fitzpatrick.  WGN:[id number] DOB: Nov 07, 1924 DOA: 02/11/2019 PCP: Carolin Guernsey, MD    Brief Narrative:  85 year old male with a history of multiple myeloma chronic kidney disease stage V, admitted to the hospital with abdominal pain, vomiting and diarrhea.  Found to have acute sigmoid diverticulitis at outside hospital and transferred to [facility] for further management.  He was started on intravenous antibiotics.  He recently underwent left iliac biopsy which was positive for metastatic adenocarcinoma.  Seen by oncology with recommendations for transition to comfort measures.   Assessment & Plan:   Active Problems:   Type 2 diabetes mellitus with complication, without long-term current use of insulin (HCC)   Hypothyroidism (acquired)   CKD (chronic kidney disease), stage V (HCC)   Multiple myeloma (HCC)   Diverticulitis   Metastatic adenocarcinoma ([place])   1. Acute sigmoid diverticulitis.  CT scan done at outside hospital.  Currently on intravenous antibiotics.  Stool for C. difficile found to be negative.  GI pathogen panel positive for Campylobacter.  Will treat with a 3-day course of azithromycin.  Advance diet as tolerated.  Diarrhea is improving 2. Chronic kidney disease stage V.  Creatinine has been trending up over the past 10 months.  He is chronically on Lasix at home which is currently on hold.  He did receive IV fluids on admission.  With multiple comorbidities and advanced age, does not appear to be a candidate for dialysis.  Mild improvement of renal function with fluid challenge, although he does have significant edema.  Will hold off on further fluids. 3. Multiple myeloma.  Followed by Dr. Delton Coombes.  Currently is not on any treatment. 4. Metastatic adenocarcinoma.  Likely primary is lung.  Patient recently underwent biopsy of left iliac bone which was noted to be positive on recent PET scan.  Results confirmed metastatic adenocarcinoma.  He was  seen by oncology who felt that he was not a candidate for any further aggressive treatment.  Goals of care were discussed with the patient's daughter and she agreed to focus of care towards comfort. 5. Non Anion gap metabolic acidosis.  Likely related to dehydration from diarrhea.  We will start oral bicarbonate replacement.. 6. Chronic combined CHF.  Patient appears to have chronic lower extremity edema.  Ejection fraction of 40% with grade 2 diastolic dysfunction on last echocardiogram in 2019.  Chronically on 20 mg of Lasix which is currently on hold due to elevated creatinine. 7. Diabetes.  He is not on any medications.  Continue to follow serial blood sugars. 8. Urinary retention.  Patient had diminished urine output.  Bladder scan showed significant residual urine.  In and out catheter was performed and he had 700 cc of urine.  We will continue to monitor and if he continues to retain, may need Foley catheter.  Started on Flomax. 9. Goals of care.  Patient's daughter is unable to care for the patient at home.  She needs him placed in a nursing facility.  He was seen by physical therapy who also agreed that he needed skilled nursing facility placement.  Social work is currently working on this.  Tentative plan will likely be to discharge the patient to [place] [facility] on Monday   DVT prophylaxis: Heparin Code Status: DNR Family Communication: Discussed with daughter 8/21 Disposition Plan: Plan will be for likely discharge to [facility] on Monday.   Consultants:   Oncology  Procedures:     Antimicrobials:  Ceftriaxone 8/18 >  Flagyl 8/18 > 8/20  Azithromycin 8/20 >   Subjective: Feels that diarrhea is better.  He does not have any abdominal pain.  He does have some nausea after eating.  Objective: Vitals:   02/13/19 2014 02/13/19 2119 02/14/19 0610 02/14/19 1438  BP:  128/78 (!) 88/67 119/81  Pulse:  (!) 103 (!) 101 92  Resp:  '19 19 16  ' Temp:  97.9 F (36.6  C) 97.6 F (36.4 C) 97.6 F (36.4 C)  TempSrc:  Oral Oral Oral  SpO2: 91% 93% 95% 98%  Weight:      Height:        Intake/Output Summary (Last 24 hours) at 02/14/2019 1938 Last data filed at 02/14/2019 1836 Gross per 24 hour  Intake 805.43 ml  Output 900 ml  Net -94.57 ml   Filed Weights   02/11/19 2145  Weight: 63.3 kg    Examination:  General exam: Alert, awake, oriented x 3 Respiratory system: Clear to auscultation. Respiratory effort normal. Cardiovascular system:RRR. No murmurs, rubs, gallops. Gastrointestinal system: Abdomen is nondistended, soft and nontender. No organomegaly or masses felt. Normal bowel sounds heard. Central nervous system: Alert and oriented. No focal neurological deficits. Extremities: 1-2+ edema in the lower extremities bilaterally Skin: No rashes, lesions or ulcers Psychiatry: Judgement and insight appear normal. Mood & affect appropriate.    Data Reviewed: I have personally reviewed following labs and imaging studies  CBC: Recent Labs  Lab 02/12/19 0518  WBC 5.7  HGB 9.5*  HCT 32.9*  MCV 100.3*  PLT 438   Basic Metabolic Panel: Recent Labs  Lab 02/11/19 2326 02/12/19 0518 02/14/19 0528  NA 134* 137 138  K 4.9 4.9 5.0  CL 109 109 112*  CO2 12* 17* 14*  GLUCOSE 75 80 90  BUN 91* 92* 83*  CREATININE 5.00* 5.03* 4.84*  CALCIUM 8.2* 8.2* 8.2*   GFR: Estimated Creatinine Clearance: 8.7 mL/min (A) (by C-G formula based on SCr of 4.84 mg/dL (H)). Liver Function Tests: No results for input(s): AST, ALT, ALKPHOS, BILITOT, PROT, ALBUMIN in the last 168 hours. No results for input(s): LIPASE, AMYLASE in the last 168 hours. No results for input(s): AMMONIA in the last 168 hours. Coagulation Profile: No results for input(s): INR, PROTIME in the last 168 hours. Cardiac Enzymes: No results for input(s): CKTOTAL, CKMB, CKMBINDEX, TROPONINI in the last 168 hours. BNP (last 3 results) No results for input(s): PROBNP in the last 8760  hours. HbA1C: Recent Labs    02/12/19 0518  HGBA1C 5.7*   CBG: Recent Labs  Lab 02/12/19 0736 02/12/19 0900 02/13/19 0648 02/13/19 0707 02/14/19 0516  GLUCAP 65* 78 90 84 78   Lipid Profile: No results for input(s): CHOL, HDL, LDLCALC, TRIG, CHOLHDL, LDLDIRECT in the last 72 hours. Thyroid Function Tests: No results for input(s): TSH, T4TOTAL, FREET4, T3FREE, THYROIDAB in the last 72 hours. Anemia Panel: No results for input(s): VITAMINB12, FOLATE, FERRITIN, TIBC, IRON, RETICCTPCT in the last 72 hours. Sepsis Labs: No results for input(s): PROCALCITON, LATICACIDVEN in the last 168 hours.  Recent Results (from the past 240 hour(s))  C difficile quick scan w PCR reflex     Status: None   Collection Time: 02/11/19  9:40 PM   Specimen: STOOL  Result Value Ref Range Status   C Diff antigen NEGATIVE NEGATIVE Final   C Diff toxin NEGATIVE NEGATIVE Final   C Diff interpretation No C. difficile detected.  Final    Comment: Performed at [facility]  [facility], [address], [place], [place] [address]  SARS Coronavirus 2 [facility] order, Performed in [facility] hospital lab) Nasopharyngeal STOOL     Status: None   Collection Time: 02/11/19  9:41 PM   Specimen: STOOL; Nasopharyngeal  Result Value Ref Range Status   SARS Coronavirus 2 NEGATIVE NEGATIVE Final    Comment: (NOTE) If result is NEGATIVE SARS-CoV-2 target nucleic acids are NOT DETECTED. The SARS-CoV-2 RNA is generally detectable in upper and lower  respiratory specimens during the acute phase of infection. The lowest  concentration of SARS-CoV-2 viral copies this assay can detect is 250  copies / mL. A negative result does not preclude SARS-CoV-2 infection  and should not be used as the sole basis for treatment or other  patient management decisions.  A negative result may occur with  improper specimen collection / handling, submission of specimen other  than nasopharyngeal swab, presence of viral mutation(s) within the   areas targeted by this assay, and inadequate number of viral copies  (<250 copies / mL). A negative result must be combined with clinical  observations, patient history, and epidemiological information. If result is POSITIVE SARS-CoV-2 target nucleic acids are DETECTED. The SARS-CoV-2 RNA is generally detectable in upper and lower  respiratory specimens dur ing the acute phase of infection.  Positive  results are indicative of active infection with SARS-CoV-2.  Clinical  correlation with patient history and other diagnostic information is  necessary to determine patient infection status.  Positive results do  not rule out bacterial infection or co-infection with other viruses. If result is PRESUMPTIVE POSTIVE SARS-CoV-2 nucleic acids MAY BE PRESENT.   A presumptive positive result was obtained on the submitted specimen  and confirmed on repeat testing.  While 2019 novel coronavirus  (SARS-CoV-2) nucleic acids may be present in the submitted sample  additional confirmatory testing may be necessary for epidemiological  and / or clinical management purposes  to differentiate between  SARS-CoV-2 and other Sarbecovirus currently known to infect humans.  If clinically indicated additional testing with an alternate test  methodology [id number]) is advised. The SARS-CoV-2 RNA is generally  detectable in upper and lower respiratory sp ecimens during the acute  phase of infection. The expected result is Negative. Fact Sheet for Patients:  StrictlyIdeas.no Fact Sheet for Healthcare Providers: BankingDealers.co.za This test is not yet approved or cleared by the [place] FDA and has been authorized for detection and/or diagnosis of SARS-CoV-2 by FDA under an Emergency Use Authorization (EUA).  This EUA will remain in effect (meaning this test can be used) for the duration of the COVID-19 declaration under Section 564(b)(1) of the Act, 21 U.S.C.  section 360bbb-3(b)(1), unless the authorization is terminated or revoked sooner. Performed at [facility], [address], [place], [place] [address]   Gastrointestinal Panel by PCR , Stool     Status: Abnormal   Collection Time: 02/11/19 10:34 PM   Specimen: STOOL  Result Value Ref Range Status   Campylobacter species DETECTED (A) NOT DETECTED Final    Comment: RESULT CALLED TO, READ BACK BY AND VERIFIED WITH: [facility] MILLS AT 1802 02/13/2019  TFK    Plesimonas shigelloides NOT DETECTED NOT DETECTED Final   Salmonella species NOT DETECTED NOT DETECTED Final   Yersinia enterocolitica NOT DETECTED NOT DETECTED Final   Vibrio species NOT DETECTED NOT DETECTED Final   Vibrio cholerae NOT DETECTED NOT DETECTED Final   Enteroaggregative E coli (EAEC) NOT DETECTED NOT DETECTED Final   Enteropathogenic E coli (  EPEC) NOT DETECTED NOT DETECTED Final   Enterotoxigenic E coli (ETEC) NOT DETECTED NOT DETECTED Final   Shiga like toxin producing E coli (STEC) NOT DETECTED NOT DETECTED Final   Shigella/Enteroinvasive E coli (EIEC) NOT DETECTED NOT DETECTED Final   Cryptosporidium NOT DETECTED NOT DETECTED Final   Cyclospora cayetanensis NOT DETECTED NOT DETECTED Final   Entamoeba histolytica NOT DETECTED NOT DETECTED Final   Giardia lamblia NOT DETECTED NOT DETECTED Final   Adenovirus F40/41 NOT DETECTED NOT DETECTED Final   Astrovirus NOT DETECTED NOT DETECTED Final   Norovirus GI/GII NOT DETECTED NOT DETECTED Final   Rotavirus A NOT DETECTED NOT DETECTED Final   Sapovirus (I, II, IV, and V) NOT DETECTED NOT DETECTED Final    Comment: Performed at [facility], [address], [place], [place] [address]         Radiology Studies: No results found.      Scheduled Meds: . aspirin EC  81 mg Oral Daily  . azithromycin  500 mg Oral Daily  . clopidogrel  75 mg Oral Daily  . heparin  5,000 Units Subcutaneous Q8H   Continuous Infusions: . sodium chloride 10 mL/hr at 02/13/19  1600  . cefTRIAXone (ROCEPHIN)  IV 2 g (02/13/19 2338)     LOS: 3 days    Time spent: 48mns    JKathie Dike MD [facility]   If 7PM-7AM, please contact night-coverage [email or web address]  02/14/2019, 7:38 PM

## 2019-02-14 NOTE — Progress Notes (Signed)
No voiding for over 24 hours.  Scan showed >200. Condom cath secure.  Denies abd pain and abd soft. Contacted Dr. Roderic Palau and he will give more IV fluids.  Daughter called and said that [facility] will not take patient and [facility] won't if patient is hospice.  She is calling more facilities.

## 2019-02-15 LAB — GLUCOSE, CAPILLARY
Glucose-Capillary: 79 mg/dL (ref 70–99)
Glucose-Capillary: 115 mg/dL — ABNORMAL HIGH (ref 70–99)

## 2019-02-15 MED ORDER — PROMETHAZINE HCL 25 MG/ML IJ SOLN
12.5000 mg | Freq: Four times a day (QID) | INTRAMUSCULAR | Status: DC | PRN
  Administered 2019-02-16: 12.5 mg via INTRAVENOUS
  Filled 2019-02-15: qty 1

## 2019-02-15 MED ORDER — LEVALBUTEROL HCL 0.63 MG/3ML IN NEBU
0.6300 mg | INHALATION_SOLUTION | Freq: Once | RESPIRATORY_TRACT | Status: AC
  Administered 2019-02-15: 0.63 mg via RESPIRATORY_TRACT
  Filled 2019-02-15: qty 3

## 2019-02-15 NOTE — Progress Notes (Signed)
PROGRESS NOTE    Randy Fitzpatrick.  ZOX:[id number] DOB: Oct 25, 1924 DOA: 02/11/2019 PCP: Carolin Guernsey, MD    Brief Narrative:  85 year old male with a history of multiple myeloma chronic kidney disease stage V, admitted to the hospital with abdominal pain, vomiting and diarrhea.  Found to have acute sigmoid diverticulitis at outside hospital and transferred to [facility] for further management.  He was started on intravenous antibiotics.  He recently underwent left iliac biopsy which was positive for metastatic adenocarcinoma.  Seen by oncology with recommendations for transition to comfort measures.   Assessment & Plan:   Active Problems:   Type 2 diabetes mellitus with complication, without long-term current use of insulin (HCC)   Hypothyroidism (acquired)   CKD (chronic kidney disease), stage V (HCC)   Multiple myeloma (HCC)   Diverticulitis   Metastatic adenocarcinoma ([place])   1. Acute sigmoid diverticulitis.  CT scan done at outside hospital.  Currently on intravenous antibiotics.  Stool for C. difficile found to be negative.  GI pathogen panel positive for Campylobacter.  Treated with a 3-day course of azithromycin.  Advance diet as tolerated.  Diarrhea is improving.  Will discontinue rectal tube 2. Chronic kidney disease stage V.  Creatinine has been trending up over the past 10 months.  He is chronically on Lasix at home which is currently on hold.  He did receive IV fluids on admission.  With multiple comorbidities and advanced age, does not appear to be a candidate for dialysis.  Mild improvement of renal function with fluid challenge, although he does have significant edema.  Will hold off on further fluids. 3. Multiple myeloma.  Followed by Dr. Delton Coombes.  Currently is not on any treatment. 4. Metastatic adenocarcinoma.  Likely primary is lung.  Patient recently underwent biopsy of left iliac bone which was noted to be positive on recent PET scan.  Results confirmed  metastatic adenocarcinoma.  He was seen by oncology who felt that he was not a candidate for any further aggressive treatment.  Goals of care were discussed with the patient's daughter and she agreed to focus of care towards comfort. 5. Non Anion gap metabolic acidosis.  Likely related to dehydration from diarrhea.  Started on oral bicarbonate replacement. 6. Chronic combined CHF.  Patient appears to have chronic lower extremity edema.  Ejection fraction of 40% with grade 2 diastolic dysfunction on last echocardiogram in 2019.  Chronically on 20 mg of Lasix which is currently on hold due to elevated creatinine. 7. Diabetes.  He is not on any medications.  Continue to follow serial blood sugars. 8. Urinary retention.  Patient had diminished urine output.  He has required intermittent catheter on multiple occasions for persistent urinary retention.  Will place Foley catheter.  Started on Flomax. 9. Goals of care.  Patient's daughter is unable to care for the patient at home.  She needs him placed in a nursing facility.  He was seen by physical therapy who also agreed that he needed skilled nursing facility placement.  Social work is currently working on this.  Tentative plan will likely be to discharge the patient to [place] center on Monday   DVT prophylaxis: Heparin Code Status: DNR Family Communication: Discussed with daughter 8/21 Disposition Plan: Plan will be for likely discharge to [place] center on Monday.   Consultants:   Oncology  Procedures:     Antimicrobials:   Ceftriaxone 8/18 >  Flagyl 8/18 > 8/20  Azithromycin 8/20 >8/22  Subjective: Diarrhea improving. Had recurrent urinary retention overnight and has required repeated I and O catheterizations since yesterday.  P.o. intake has been poor.  Is felt nauseous today.  Objective: Vitals:   02/14/19 1438 02/14/19 2131 02/15/19 0601 02/15/19 1331  BP: 119/81 120/72 122/90 124/74  Pulse: 92 98 96 92  Resp: _0 (!) 8  Temp: 97.6 F (36.4 C) 97.9 F (36.6 C) 98.6 F (37 C)   TempSrc: Oral Oral Oral   SpO2: 98% 99% 97% 100%  Weight:      Height:        Intake/Output Summary (Last 24 hours) at 02/15/2019 1740 Last data filed at 02/15/2019 0657 Gross per 24 hour  Intake -  Output 501 ml  Net -501 ml   Filed Weights   02/11/19 2145  Weight: 63.3 kg    Examination:  General exam: Alert, awake, oriented x 3 Respiratory system: Clear to auscultation. Respiratory effort normal. Cardiovascular system:RRR. No murmurs, rubs, gallops. Gastrointestinal system: Abdomen is nondistended, soft and nontender. No organomegaly or masses felt. Normal bowel sounds heard. Central nervous system: Alert and oriented. No focal neurological deficits. Extremities: 1-2+ pitting edema bilaterally Skin: No rashes, lesions or ulcers Psychiatry: Judgement and insight appear normal. Mood & affect appropriate.     Data Reviewed: I have personally reviewed following labs and imaging studies  CBC: Recent Labs  Lab 02/12/19 0518  WBC 5.7  HGB 9.5*  HCT 32.9*  MCV 100.3*  PLT 161   Basic Metabolic Panel: Recent Labs  Lab 02/11/19 2326 02/12/19 0518 02/14/19 0528  NA 134* 137 138  K 4.9 4.9 5.0  CL 109 109 112*  CO2 12* 17* 14*  GLUCOSE 75 80 90  BUN 91* 92* 83*  CREATININE 5.00* 5.03* 4.84*  CALCIUM 8.2* 8.2* 8.2*   GFR: Estimated Creatinine Clearance: 8.7 mL/min (A) (by C-G formula based on SCr of 4.84 mg/dL (H)). Liver Function Tests: No results for input(s): AST, ALT, ALKPHOS, BILITOT, PROT, ALBUMIN in the last 168 hours. No results for input(s): LIPASE, AMYLASE in the last 168 hours. No results for input(s): AMMONIA in the last 168 hours. Coagulation Profile: No results for input(s): INR, PROTIME in the last 168 hours. Cardiac Enzymes: No results for input(s): CKTOTAL, CKMB, CKMBINDEX, TROPONINI in the last 168 hours. BNP (last 3 results) No results for input(s): PROBNP in the last  8760 hours. HbA1C: No results for input(s): HGBA1C in the last 72 hours. CBG: Recent Labs  Lab 02/12/19 0900 02/13/19 0648 02/13/19 0707 02/14/19 0516 02/15/19 0538  GLUCAP 78 90 84 78 79   Lipid Profile: No results for input(s): CHOL, HDL, LDLCALC, TRIG, CHOLHDL, LDLDIRECT in the last 72 hours. Thyroid Function Tests: No results for input(s): TSH, T4TOTAL, FREET4, T3FREE, THYROIDAB in the last 72 hours. Anemia Panel: No results for input(s): VITAMINB12, FOLATE, FERRITIN, TIBC, IRON, RETICCTPCT in the last 72 hours. Sepsis Labs: No results for input(s): PROCALCITON, LATICACIDVEN in the last 168 hours.  Recent Results (from the past 240 hour(s))  C difficile quick scan w PCR reflex     Status: None   Collection Time: 02/11/19  9:40 PM   Specimen: STOOL  Result Value Ref Range Status   C Diff antigen NEGATIVE NEGATIVE Final   C Diff toxin NEGATIVE NEGATIVE Final   C Diff interpretation No C. difficile detected.  Final    Comment: Performed at [facility], [address], [place], [place] [address]  [place] [facility] order,  Performed in [facility] hospital lab) Nasopharyngeal STOOL     Status: None   Collection Time: 02/11/19  9:41 PM   Specimen: STOOL; Nasopharyngeal  Result Value Ref Range Status   SARS Coronavirus 2 NEGATIVE NEGATIVE Final    Comment: (NOTE) If result is NEGATIVE SARS-CoV-2 target nucleic acids are NOT DETECTED. The SARS-CoV-2 RNA is generally detectable in upper and lower  respiratory specimens during the acute phase of infection. The lowest  concentration of SARS-CoV-2 viral copies this assay can detect is 250  copies / mL. A negative result does not preclude SARS-CoV-2 infection  and should not be used as the sole basis for treatment or other  patient management decisions.  A negative result may occur with  improper specimen collection / handling, submission of specimen other  than nasopharyngeal swab, presence of viral mutation(s) within  the  areas targeted by this assay, and inadequate number of viral copies  (<250 copies / mL). A negative result must be combined with clinical  observations, patient history, and epidemiological information. If result is POSITIVE SARS-CoV-2 target nucleic acids are DETECTED. The SARS-CoV-2 RNA is generally detectable in upper and lower  respiratory specimens dur ing the acute phase of infection.  Positive  results are indicative of active infection with SARS-CoV-2.  Clinical  correlation with patient history and other diagnostic information is  necessary to determine patient infection status.  Positive results do  not rule out bacterial infection or co-infection with other viruses. If result is PRESUMPTIVE POSTIVE SARS-CoV-2 nucleic acids MAY BE PRESENT.   A presumptive positive result was obtained on the submitted specimen  and confirmed on repeat testing.  While 2019 novel coronavirus  (SARS-CoV-2) nucleic acids may be present in the submitted sample  additional confirmatory testing may be necessary for epidemiological  and / or clinical management purposes  to differentiate between  SARS-CoV-2 and other Sarbecovirus currently known to infect humans.  If clinically indicated additional testing with an alternate test  methodology [phone]) is advised. The SARS-CoV-2 RNA is generally  detectable in upper and lower respiratory sp ecimens during the acute  phase of infection. The expected result is Negative. Fact Sheet for Patients:  StrictlyIdeas.no Fact Sheet for Healthcare Providers: BankingDealers.co.za This test is not yet approved or cleared by the [place] FDA and has been authorized for detection and/or diagnosis of SARS-CoV-2 by FDA under an Emergency Use Authorization (EUA).  This EUA will remain in effect (meaning this test can be used) for the duration of the COVID-19 declaration under Section 564(b)(1) of the Act, 21  U.S.C. section 360bbb-3(b)(1), unless the authorization is terminated or revoked sooner. Performed at [facility], [address] [place], [place] [address]   Gastrointestinal Panel by PCR , Stool     Status: Abnormal   Collection Time: 02/11/19 10:34 PM   Specimen: STOOL  Result Value Ref Range Status   Campylobacter species DETECTED (A) NOT DETECTED Final    Comment: RESULT CALLED TO, READ BACK BY AND VERIFIED WITH: [facility] AT 1802 02/13/2019  TFK    Plesimonas shigelloides NOT DETECTED NOT DETECTED Final   Salmonella species NOT DETECTED NOT DETECTED Final   Yersinia enterocolitica NOT DETECTED NOT DETECTED Final   Vibrio species NOT DETECTED NOT DETECTED Final   Vibrio cholerae NOT DETECTED NOT DETECTED Final   Enteroaggregative E coli (EAEC) NOT DETECTED NOT DETECTED Final   Enteropathogenic E coli (EPEC) NOT DETECTED NOT DETECTED Final   Enterotoxigenic E coli (ETEC) NOT DETECTED  NOT DETECTED Final   Shiga like toxin producing E coli (STEC) NOT DETECTED NOT DETECTED Final   Shigella/Enteroinvasive E coli (EIEC) NOT DETECTED NOT DETECTED Final   Cryptosporidium NOT DETECTED NOT DETECTED Final   Cyclospora cayetanensis NOT DETECTED NOT DETECTED Final   Entamoeba histolytica NOT DETECTED NOT DETECTED Final   Giardia lamblia NOT DETECTED NOT DETECTED Final   Adenovirus F40/41 NOT DETECTED NOT DETECTED Final   Astrovirus NOT DETECTED NOT DETECTED Final   Norovirus GI/GII NOT DETECTED NOT DETECTED Final   Rotavirus A NOT DETECTED NOT DETECTED Final   Sapovirus (I, II, IV, and V) NOT DETECTED NOT DETECTED Final    Comment: Performed at [facility], [address] [place], [place] [address]         Radiology Studies: No results found.      Scheduled Meds: . aspirin EC  81 mg Oral Daily  . clopidogrel  75 mg Oral Daily  . heparin  5,000 Units Subcutaneous Q8H  . sodium bicarbonate  650 mg Oral BID  . tamsulosin  0.4 mg Oral QPC supper   Continuous  Infusions: . sodium chloride 10 mL/hr at 02/13/19 1600  . cefTRIAXone (ROCEPHIN)  IV 2 g (02/14/19 2328)     LOS: 4 days    Time spent: 41mns    JKathie Dike MD [facility]   If 7PM-7AM, please contact night-coverage [email or web address]  02/15/2019, 5:40 PM

## 2019-02-15 NOTE — Progress Notes (Signed)
156cc of urine in bladder according to bladder scan. Will page MD for in and out due to patient having greater than 200cc of urine on day shift and 700cc of urine returned.

## 2019-02-15 NOTE — Progress Notes (Signed)
Patient received one time breathing treatment ordered by NP. He has been placed on 2 lpm oxygen by nurse. Saturation 98 he has chronic kidney failure some past smoking.

## 2019-02-15 NOTE — Progress Notes (Signed)
Patient hasn't voided all shift. Bladder scan showed 132 in bladder. Will continue to monitor throughout shift.

## 2019-02-15 NOTE — Progress Notes (Signed)
In and out cath patient. Returned urine was 300cc. Patient tolerated well. Will continue to monitor throughout shift.

## 2019-02-15 NOTE — Progress Notes (Signed)
MD gave verbal order for in and out catheter.

## 2019-02-15 NOTE — Progress Notes (Signed)
Patient complaining of having trouble breathing. Patient respirations and vss are stable 94% on room air. Notified [facility] orders received. Notified RT of new orders.

## 2019-02-16 MED ORDER — AMOXICILLIN-POT CLAVULANATE 875-125 MG PO TABS
1.0000 | ORAL_TABLET | Freq: Two times a day (BID) | ORAL | Status: DC
  Administered 2019-02-16 – 2019-02-17 (×2): 1 via ORAL
  Filled 2019-02-16 (×2): qty 1

## 2019-02-16 MED ORDER — FUROSEMIDE 20 MG PO TABS
20.0000 mg | ORAL_TABLET | Freq: Every day | ORAL | Status: DC
  Administered 2019-02-16 – 2019-02-17 (×2): 20 mg via ORAL
  Filled 2019-02-16 (×2): qty 1

## 2019-02-16 NOTE — Progress Notes (Signed)
PROGRESS NOTE    Randy Fitzpatrick.  ECX:[id number] DOB: Dec 07, 1924 DOA: 02/11/2019 PCP: Carolin Guernsey, MD    Brief Narrative:  85 year old male with a history of multiple myeloma chronic kidney disease stage V, admitted to the hospital with abdominal pain, vomiting and diarrhea.  Found to have acute sigmoid diverticulitis at outside hospital and transferred to [facility] for further management.  He was started on intravenous antibiotics.  He recently underwent left iliac biopsy which was positive for metastatic adenocarcinoma.  Seen by oncology with recommendations for transition to comfort measures.   Assessment & Plan:   Active Problems:   Type 2 diabetes mellitus with complication, without long-term current use of insulin (HCC)   Hypothyroidism (acquired)   CKD (chronic kidney disease), stage V (HCC)   Multiple myeloma (HCC)   Diverticulitis   Metastatic adenocarcinoma ([place])   1. Acute sigmoid diverticulitis.  CT scan done at outside hospital.  Currently on intravenous antibiotics.  Stool for C. difficile found to be negative.  GI pathogen panel positive for Campylobacter.  Treated with a 3-day course of azithromycin.  Advance diet as tolerated.  Diarrhea is improving.  Discontinue further rocephin and transition to augmentin. 2. Chronic kidney disease stage V.  Creatinine has been trending up over the past 10 months.  He is chronically on Lasix at home which is currently on hold.  He did receive IV fluids on admission.  With multiple comorbidities and advanced age, does not appear to be a candidate for dialysis.  Mild improvement of renal function with fluid challenge, although he does have significant edema.  Resume on lasix due to discomfort from LE edema. 3. Multiple myeloma.  Followed by Dr. Delton Coombes.  Currently is not on any treatment. 4. Metastatic adenocarcinoma.  Likely primary is lung.  Patient recently underwent biopsy of left iliac bone which was noted to be positive  on recent PET scan.  Results confirmed metastatic adenocarcinoma.  He was seen by oncology who felt that he was not a candidate for any further aggressive treatment.  Goals of care were discussed with the patient's daughter and she agreed to focus of care towards comfort. 5. Non Anion gap metabolic acidosis.  Likely related to dehydration from diarrhea.  Started on oral bicarbonate replacement. 6. Chronic combined CHF.  Patient appears to have chronic lower extremity edema.  Ejection fraction of 40% with grade 2 diastolic dysfunction on last echocardiogram in 2019.  Chronically on 20 mg of Lasix which is currently on hold due to elevated creatinine. 7. Diabetes.  He is not on any medications.  Continue to follow serial blood sugars. 8. Urinary retention.  Patient had diminished urine output.  He had required intermittent catheter on multiple occasions for persistent urinary retention.  Foley catheter placed.  Started on Flomax. 9. Goals of care.  Patient's daughter is unable to care for the patient at home.  She needs him placed in a nursing facility.  He was seen by physical therapy who also agreed that he needed skilled nursing facility placement.  Social work is currently working on this.  Tentative plan will likely be to discharge the patient to [place] center on Monday   DVT prophylaxis: Heparin Code Status: DNR Family Communication: Discussed with daughter 8/22 Disposition Plan: Plan will be for likely discharge to [place] center on Monday.   Consultants:   Oncology  Procedures:     Antimicrobials:   Ceftriaxone 8/18 >8/23  Flagyl 8/18 > 8/20  Azithromycin 8/20 >8/22  Augmentin 8/23>   Subjective: Diarrhea improving. Feels better than he has in a while. No shortness of breath. Overnight events reviewed and he had to be placed on oxygen due to shortness of breath overnight.  Objective: Vitals:   02/15/19 1331 02/15/19 2035 02/15/19 2140 02/16/19 0556  BP: 124/74  124/76  124/73  Pulse: 92 96  98  Resp: (!) '8 18  16  ' Temp:  (!) 97.5 F (36.4 C)  97.6 F (36.4 C)  TempSrc:  Oral  Oral  SpO2: 100% 94% 98% 100%  Weight:      Height:        Intake/Output Summary (Last 24 hours) at 02/16/2019 1556 Last data filed at 02/16/2019 0900 Gross per 24 hour  Intake 340 ml  Output 300 ml  Net 40 ml   Filed Weights   02/11/19 2145  Weight: 63.3 kg    Examination:  General exam: Alert, awake, oriented x 3 Respiratory system: Clear to auscultation. Respiratory effort normal. Cardiovascular system:RRR. No murmurs, rubs, gallops. Gastrointestinal system: Abdomen is nondistended, soft and nontender. No organomegaly or masses felt. Normal bowel sounds heard. Central nervous system: Alert and oriented. No focal neurological deficits. Extremities: 2+ pitting edema bilaterally Skin: No rashes, lesions or ulcers Psychiatry: Judgement and insight appear normal. Mood & affect appropriate.       Data Reviewed: I have personally reviewed following labs and imaging studies  CBC: Recent Labs  Lab 02/12/19 0518  WBC 5.7  HGB 9.5*  HCT 32.9*  MCV 100.3*  PLT 270   Basic Metabolic Panel: Recent Labs  Lab 02/11/19 2326 02/12/19 0518 02/14/19 0528  NA 134* 137 138  K 4.9 4.9 5.0  CL 109 109 112*  CO2 12* 17* 14*  GLUCOSE 75 80 90  BUN 91* 92* 83*  CREATININE 5.00* 5.03* 4.84*  CALCIUM 8.2* 8.2* 8.2*   GFR: Estimated Creatinine Clearance: 8.7 mL/min (A) (by C-G formula based on SCr of 4.84 mg/dL (H)). Liver Function Tests: No results for input(s): AST, ALT, ALKPHOS, BILITOT, PROT, ALBUMIN in the last 168 hours. No results for input(s): LIPASE, AMYLASE in the last 168 hours. No results for input(s): AMMONIA in the last 168 hours. Coagulation Profile: No results for input(s): INR, PROTIME in the last 168 hours. Cardiac Enzymes: No results for input(s): CKTOTAL, CKMB, CKMBINDEX, TROPONINI in the last 168 hours. BNP (last 3 results) No results  for input(s): PROBNP in the last 8760 hours. HbA1C: No results for input(s): HGBA1C in the last 72 hours. CBG: Recent Labs  Lab 02/13/19 0648 02/13/19 0707 02/14/19 0516 02/15/19 0538 02/15/19 2122  GLUCAP 90 84 78 79 115*   Lipid Profile: No results for input(s): CHOL, HDL, LDLCALC, TRIG, CHOLHDL, LDLDIRECT in the last 72 hours. Thyroid Function Tests: No results for input(s): TSH, T4TOTAL, FREET4, T3FREE, THYROIDAB in the last 72 hours. Anemia Panel: No results for input(s): VITAMINB12, FOLATE, FERRITIN, TIBC, IRON, RETICCTPCT in the last 72 hours. Sepsis Labs: No results for input(s): PROCALCITON, LATICACIDVEN in the last 168 hours.  Recent Results (from the past 240 hour(s))  C difficile quick scan w PCR reflex     Status: None   Collection Time: 02/11/19  9:40 PM   Specimen: STOOL  Result Value Ref Range Status   C Diff antigen NEGATIVE NEGATIVE Final   C Diff toxin NEGATIVE NEGATIVE Final   C Diff interpretation No C. difficile detected.  Final    Comment: Performed at [facility],  [address], [place], [place] [address]  SARS Coronavirus 2 [facility] order, Performed in [facility] hospital lab) Nasopharyngeal STOOL     Status: None   Collection Time: 02/11/19  9:41 PM   Specimen: STOOL; Nasopharyngeal  Result Value Ref Range Status   SARS Coronavirus 2 NEGATIVE NEGATIVE Final    Comment: (NOTE) If result is NEGATIVE SARS-CoV-2 target nucleic acids are NOT DETECTED. The SARS-CoV-2 RNA is generally detectable in upper and lower  respiratory specimens during the acute phase of infection. The lowest  concentration of SARS-CoV-2 viral copies this assay can detect is 250  copies / mL. A negative result does not preclude SARS-CoV-2 infection  and should not be used as the sole basis for treatment or other  patient management decisions.  A negative result may occur with  improper specimen collection / handling, submission of specimen other  than nasopharyngeal swab,  presence of viral mutation(s) within the  areas targeted by this assay, and inadequate number of viral copies  (<250 copies / mL). A negative result must be combined with clinical  observations, patient history, and epidemiological information. If result is POSITIVE SARS-CoV-2 target nucleic acids are DETECTED. The SARS-CoV-2 RNA is generally detectable in upper and lower  respiratory specimens dur ing the acute phase of infection.  Positive  results are indicative of active infection with SARS-CoV-2.  Clinical  correlation with patient history and other diagnostic information is  necessary to determine patient infection status.  Positive results do  not rule out bacterial infection or co-infection with other viruses. If result is PRESUMPTIVE POSTIVE SARS-CoV-2 nucleic acids MAY BE PRESENT.   A presumptive positive result was obtained on the submitted specimen  and confirmed on repeat testing.  While 2019 novel coronavirus  (SARS-CoV-2) nucleic acids may be present in the submitted sample  additional confirmatory testing may be necessary for epidemiological  and / or clinical management purposes  to differentiate between  SARS-CoV-2 and other Sarbecovirus currently known to infect humans.  If clinically indicated additional testing with an alternate test  methodology [id number]) is advised. The SARS-CoV-2 RNA is generally  detectable in upper and lower respiratory sp ecimens during the acute  phase of infection. The expected result is Negative. Fact Sheet for Patients:  StrictlyIdeas.no Fact Sheet for Healthcare Providers: BankingDealers.co.za This test is not yet approved or cleared by the [place] FDA and has been authorized for detection and/or diagnosis of SARS-CoV-2 by FDA under an Emergency Use Authorization (EUA).  This EUA will remain in effect (meaning this test can be used) for the duration of the COVID-19 declaration under  Section 564(b)(1) of the Act, 21 U.S.C. section 360bbb-3(b)(1), unless the authorization is terminated or revoked sooner. Performed at [facility], [address], [place], [place] [address]   Gastrointestinal Panel by PCR , Stool     Status: Abnormal   Collection Time: 02/11/19 10:34 PM   Specimen: STOOL  Result Value Ref Range Status   Campylobacter species DETECTED (A) NOT DETECTED Final    Comment: RESULT CALLED TO, READ BACK BY AND VERIFIED WITH: [facility] AT 1802 02/13/2019  TFK    Plesimonas shigelloides NOT DETECTED NOT DETECTED Final   Salmonella species NOT DETECTED NOT DETECTED Final   Yersinia enterocolitica NOT DETECTED NOT DETECTED Final   Vibrio species NOT DETECTED NOT DETECTED Final   Vibrio cholerae NOT DETECTED NOT DETECTED Final   Enteroaggregative E coli (EAEC) NOT DETECTED NOT DETECTED Final   Enteropathogenic E coli (EPEC) NOT  DETECTED NOT DETECTED Final   Enterotoxigenic E coli (ETEC) NOT DETECTED NOT DETECTED Final   Shiga like toxin producing E coli (STEC) NOT DETECTED NOT DETECTED Final   Shigella/Enteroinvasive E coli (EIEC) NOT DETECTED NOT DETECTED Final   Cryptosporidium NOT DETECTED NOT DETECTED Final   Cyclospora cayetanensis NOT DETECTED NOT DETECTED Final   Entamoeba histolytica NOT DETECTED NOT DETECTED Final   Giardia lamblia NOT DETECTED NOT DETECTED Final   Adenovirus F40/41 NOT DETECTED NOT DETECTED Final   Astrovirus NOT DETECTED NOT DETECTED Final   Norovirus GI/GII NOT DETECTED NOT DETECTED Final   Rotavirus A NOT DETECTED NOT DETECTED Final   Sapovirus (I, II, IV, and V) NOT DETECTED NOT DETECTED Final    Comment: Performed at [facility], [address] [place], [place] [address]         Radiology Studies: No results found.      Scheduled Meds: . aspirin EC  81 mg Oral Daily  . clopidogrel  75 mg Oral Daily  . furosemide  20 mg Oral Daily  . heparin  5,000 Units Subcutaneous Q8H  . sodium bicarbonate  650 mg  Oral BID  . tamsulosin  0.4 mg Oral QPC supper   Continuous Infusions: . sodium chloride 10 mL/hr at 02/13/19 1600  . cefTRIAXone (ROCEPHIN)  IV 2 g (02/15/19 2213)     LOS: 5 days    Time spent: 7mns    JKathie Dike MD [facility]   If 7PM-7AM, please contact night-coverage [email or web address]  02/16/2019, 3:56 PM

## 2019-02-17 ENCOUNTER — Other Ambulatory Visit: Payer: Self-pay | Admitting: Adult Health

## 2019-02-17 ENCOUNTER — Inpatient Hospital Stay
Admission: RE | Admit: 2019-02-17 | Discharge: 2019-02-25 | Disposition: E | Payer: Medicare Other | Source: Ambulatory Visit | Attending: Internal Medicine | Admitting: Internal Medicine

## 2019-02-17 ENCOUNTER — Encounter (HOSPITAL_COMMUNITY): Payer: Self-pay | Admitting: *Deleted

## 2019-02-17 LAB — GLUCOSE, CAPILLARY
Glucose-Capillary: 80 mg/dL (ref 70–99)
Glucose-Capillary: 79 mg/dL (ref 70–99)

## 2019-02-17 MED ORDER — ACETAMINOPHEN 325 MG PO TABS: 650.0000 mg | ORAL_TABLET | Freq: Four times a day (QID) | ORAL | Status: AC | PRN

## 2019-02-17 MED ORDER — PROMETHAZINE HCL 12.5 MG PO TABS: 12.5000 mg | ORAL_TABLET | Freq: Four times a day (QID) | ORAL | 0 refills | Status: AC | PRN

## 2019-02-17 MED ORDER — MORPHINE SULFATE (CONCENTRATE) 10 MG /0.5 ML PO SOLN: 10.0000 mg | ORAL | 0 refills | Status: AC | PRN

## 2019-02-17 MED ORDER — LOPERAMIDE HCL 2 MG PO CAPS: 2.0000 mg | ORAL_CAPSULE | Freq: Two times a day (BID) | ORAL | 0 refills | Status: AC | PRN

## 2019-02-17 MED ORDER — MORPHINE SULFATE (CONCENTRATE) 10 MG /0.5 ML PO SOLN: 10.0000 mg | ORAL | 0 refills | Status: DC | PRN

## 2019-02-17 MED ORDER — FUROSEMIDE 10 MG/ML IJ SOLN
40.0000 mg | Freq: Once | INTRAMUSCULAR | Status: AC
  Administered 2019-02-17: 40 mg via INTRAVENOUS
  Filled 2019-02-17: qty 4

## 2019-02-17 NOTE — Progress Notes (Signed)
I spoke with [facility], case manager today and she confirms that patient does have placement at [facility].  She is going to update the patient's daughter.  Nothing further needed from navigation.

## 2019-02-17 NOTE — Progress Notes (Signed)
IV x2 removed, belongings gathered at bedside. Report called to Smith Mince, LPN. Daughter at bedside, verbalized understanding. Patient to be transported to [facility] with indwelling foley catheter. Transported via Biomedical scientist by [employer].

## 2019-02-17 NOTE — TOC Transition Note (Signed)
Transition of Care [facility]) - CM/SW Discharge Note   Patient Details  Name: Randy Fitzpatrick. MRN: [id number] Date of Birth: Sep 27, 1924  [facility] CM/SW Contact:  Shade Flood, LCSW Phone Number: 01/27/2019, 11:49 AM   Clinical Narrative:     Pt stable for dc today per MD. Pt is accepted as private pay at [facility]. Spoke with pt's daughter, Randy Fitzpatrick to update. Per Randy Fitzpatrick, she accepts the bed at [facility]. She is aware Marianna Fuss from [facility] will call her to finalize arrangements. Per Marianna Fuss, pt does not need PASRR due to [place] waiver. RN can call report when pt ready. There are no other TOC needs for dc.  Final next level of care: Skilled Nursing Facility Barriers to Discharge: Barriers Resolved   Patient Goals and CMS Choice Patient states their goals for this hospitalization and ongoing recovery are:: to go to nursing facility CMS Medicare.gov Compare Post Acute Care list provided to:: Patient Represenative (must comment)(daughter) Choice offered to / list presented to : Patient  Discharge Placement              Patient chooses bed at: [facility] Patient to be transferred to facility by: wheelchair Name of family member notified: Randy Fitzpatrick (Dtr) Patient and family notified of of transfer: 02/05/2019  Discharge Plan and Services   Discharge Planning Services: CM Consult Post Acute Care Choice: Nursing Home                               Social Determinants of Health (SDOH) Interventions     Readmission Risk Interventions Readmission Risk Prevention Plan 01/27/2019 02/14/2019  Transportation Screening Complete Complete  PCP or Specialist Appt within 5-7 Days Not Complete -  Not Complete comments Pt discharging to NH -  Home Care Screening Not Complete -  Home Care Screening Not Completed Comments Pt discharging to NH -  Medication Review (RN CM) Complete -  HRI or Home Care Consult - Not Complete  HRI or Home Care Consult comments - Looking at SNF placement   Social Work Consult for [place] Planning/Counseling - Complete  Palliative Care Screening - Not Applicable  Medication Review Press photographer) - Complete  Some recent data might be hidden

## 2019-02-17 NOTE — Care Management Important Message (Signed)
Important Message  Patient Details  Name: Randy Fitzpatrick. MRN: [id number] Date of Birth: March 09, 1925   Medicare Important Message Given:  Yes     Tommy Medal 02/18/2019, 10:08 AM

## 2019-02-17 NOTE — Discharge Summary (Signed)
Physician Discharge Summary  Lewayne Bunting. VHQ:[id number] DOB: April 12, 1925 DOA: 02/11/2019  PCP: Carolin Guernsey, MD  Admit date: 02/11/2019 Discharge date: 02/15/2019  Admitted From: Home Disposition: Skilled nursing facility  Recommendations for Outpatient Follow-up:  1. Patient will be transitioned to [facility] for end-of-life care.  Goal of therapy should be patient's comfort.  Discharge Condition: Stable CODE STATUS: DNR, comfort measures Diet recommendation: Full liquid diet  Brief/Interim Summary: 85 year old male with a history of multiple myeloma, chronic kidney disease stage V, admitted to the hospital with abdominal pain, vomiting and diarrhea.  Found to have acute sigmoid diverticulitis at outside hospital and transferred to [facility] for further management.  He was started on intravenous antibiotics.  He recently underwent left iliac biopsy which was positive for metastatic adenocarcinoma.  Seen by oncology with recommendations for transition to comfort measures  Discharge Diagnoses:  Active Problems:   Type 2 diabetes mellitus with complication, without long-term current use of insulin (HCC)   Hypothyroidism (acquired)   CKD (chronic kidney disease), stage V (HCC)   Multiple myeloma (HCC)   Diverticulitis   Metastatic adenocarcinoma ([place])  1. Acute sigmoid diverticulitis.  CT scan done at outside hospital.  Stool for C. difficile found to be negative.  GI pathogen panel positive for Campylobacter.    He was treated with antibiotics.  Overall diarrhea has improved.  Is not having any abdominal pain.  He is having nausea, but I suspect this is related to his uremia.. 2. Chronic kidney disease stage V.  Creatinine has been trending up over the past 10 months. With multiple comorbidities and advanced age, he was not a candidate for dialysis.    He is resumed on his home dose of Lasix on discharge to help treat lower extremity edema.  His family understands that his renal  function will likely continue to decline. 3. Multiple myeloma.  Followed by Dr. Delton Coombes.  Currently is not on any treatment. 4. Metastatic adenocarcinoma.  Likely primary is lung.  Patient recently underwent biopsy of left iliac bone which was noted to be positive on recent PET scan.  Results confirmed metastatic adenocarcinoma.  He was seen by oncology who felt that he was not a candidate for any further aggressive treatment.  Goals of care were discussed with the patient's daughter and she agreed to focus care towards comfort. 5. Chronic combined CHF.  Patient appears to have chronic lower extremity edema.  Ejection fraction of 40% with grade 2 diastolic dysfunction on last echocardiogram in 2019.  Chronically on 20 mg of Lasix  6. Diabetes.  He is not on any medications.  7. Urinary retention.  Patient had diminished urine output.  He had required intermittent catheter on multiple occasions for persistent urinary retention.  Foley catheter placed.    Will leave catheter in place for comfort 8. Goals of care.  Patient's daughter is unable to care for the patient at home.  She needs him placed in a nursing facility.  He was seen by physical therapy who also agreed that he needed skilled nursing facility placement.    He has been accepted at [facility] for long-term care.  Goal of therapy would be patient's comfort for end-of-life care.  Medications will be revised to only continue medications directly related to his comfort.  Prognosis is days to weeks.  Discharge Instructions  Discharge Instructions    Diet - low sodium heart healthy   Complete by: As directed    Increase  activity slowly   Complete by: As directed      Allergies as of 02/03/2019   No Known Allergies     Medication List    STOP taking these medications   aspirin 81 MG EC tablet   atorvastatin 80 MG tablet Commonly known as: LIPITOR   carvedilol 3.125 MG tablet Commonly known as: COREG   clopidogrel 75 MG  tablet Commonly known as: PLAVIX   finasteride 5 MG tablet Commonly known as: PROSCAR   levothyroxine 50 MCG tablet Commonly known as: SYNTHROID   nitroGLYCERIN 0.4 MG SL tablet Commonly known as: NITROSTAT   pantoprazole 40 MG tablet Commonly known as: PROTONIX   tamsulosin 0.4 MG Caps capsule Commonly known as: FLOMAX     TAKE these medications   acetaminophen 325 MG tablet Commonly known as: TYLENOL Take 2 tablets (650 mg total) by mouth every 6 (six) hours as needed for mild pain (or Fever >/= 101).   furosemide 20 MG tablet Commonly known as: LASIX TAKE 1 TABLET EVERY OTHER DAY.   loperamide 2 MG capsule Commonly known as: IMODIUM Take 1 capsule (2 mg total) by mouth 2 (two) times daily as needed for diarrhea or loose stools. As needed. What changed:   when to take this  reasons to take this   morphine CONCENTRATE 10 mg / 0.5 ml concentrated solution Take 0.5 mLs (10 mg total) by mouth every 2 (two) hours as needed for severe pain or shortness of breath.   promethazine 12.5 MG tablet Commonly known as: PHENERGAN Take 1 tablet (12.5 mg total) by mouth every 6 (six) hours as needed for nausea or vomiting.       No Known Allergies  Consultations:  Oncology   Procedures/Studies: Ct Biopsy  Result Date: 05-Mar-2019 INDICATION: 85 year old with myeloma and destructive lytic lesion in the left ilium. EXAM: CT-GUIDED BIOPSY OF LEFT ILIAC BONE LESION MEDICATIONS: None. ANESTHESIA/SEDATION: None FLUOROSCOPY TIME:  None COMPLICATIONS: None immediate. PROCEDURE: Informed written consent was obtained from the patient after a thorough discussion of the procedural risks, benefits and alternatives. All questions were addressed. A timeout was performed prior to the initiation of the procedure. Patient was placed on the CT scanner with the left side elevated. CT images were obtained in the pelvis. The lytic destructive lesion in the left ilium was identified and targeted.  The left lateral hip region was prepped with chlorhexidine and sterile field was created. Skin and soft tissues were anesthetized with 1% lidocaine. Using CT guidance, 17 gauge coaxial needle was directed into the soft tissue lesion. Multiple core biopsies were obtained. Specimens placed in formalin and saline. Bandage placed over the puncture site. FINDINGS: Destructive lesion involving the left ilium. Soft tissue core biopsies were obtained of the bone lesion. IMPRESSION: CT-guided core biopsies of the left iliac bone lesion. Electronically Signed   By: Markus Daft M.D.   On: 2019/03/05 13:11       Subjective: Does not feel well today.  Has some nausea.  Feels mildly short of breath.  Discharge Exam: Vitals:   02/16/19 1624 02/16/19 2111 02/15/2019 0545 02/16/2019 0819  BP: 126/76 112/66 108/72   Pulse: 100 (!) 107 (!) 101   Resp: '19 19 19   ' Temp: 97.8 F (36.6 C) 99.2 F (37.3 C) (!) 97.4 F (36.3 C)   TempSrc: Oral Oral Oral   SpO2: 95% 99% 96% 95%  Weight:      Height:        General: Pt is  alert, awake, not in acute distress Cardiovascular: RRR, S1/S2 +, no rubs, no gallops Respiratory: CTA bilaterally, no wheezing, no rhonchi Abdominal: Soft, NT, ND, bowel sounds + Extremities: 2+ edema, no cyanosis    The results of significant diagnostics from this hospitalization (including imaging, microbiology, ancillary and laboratory) are listed below for reference.     Microbiology: Recent Results (from the past 240 hour(s))  C difficile quick scan w PCR reflex     Status: None   Collection Time: 02/11/19  9:40 PM   Specimen: STOOL  Result Value Ref Range Status   C Diff antigen NEGATIVE NEGATIVE Final   C Diff toxin NEGATIVE NEGATIVE Final   C Diff interpretation No C. difficile detected.  Final    Comment: Performed at [facility], [address], [place], [place] [address]  SARS Coronavirus 2 [facility] order, Performed in [facility] hospital lab) Nasopharyngeal STOOL      Status: None   Collection Time: 02/11/19  9:41 PM   Specimen: STOOL; Nasopharyngeal  Result Value Ref Range Status   SARS Coronavirus 2 NEGATIVE NEGATIVE Final    Comment: (NOTE) If result is NEGATIVE SARS-CoV-2 target nucleic acids are NOT DETECTED. The SARS-CoV-2 RNA is generally detectable in upper and lower  respiratory specimens during the acute phase of infection. The lowest  concentration of SARS-CoV-2 viral copies this assay can detect is 250  copies / mL. A negative result does not preclude SARS-CoV-2 infection  and should not be used as the sole basis for treatment or other  patient management decisions.  A negative result may occur with  improper specimen collection / handling, submission of specimen other  than nasopharyngeal swab, presence of viral mutation(s) within the  areas targeted by this assay, and inadequate number of viral copies  (<250 copies / mL). A negative result must be combined with clinical  observations, patient history, and epidemiological information. If result is POSITIVE SARS-CoV-2 target nucleic acids are DETECTED. The SARS-CoV-2 RNA is generally detectable in upper and lower  respiratory specimens dur ing the acute phase of infection.  Positive  results are indicative of active infection with SARS-CoV-2.  Clinical  correlation with patient history and other diagnostic information is  necessary to determine patient infection status.  Positive results do  not rule out bacterial infection or co-infection with other viruses. If result is PRESUMPTIVE POSTIVE SARS-CoV-2 nucleic acids MAY BE PRESENT.   A presumptive positive result was obtained on the submitted specimen  and confirmed on repeat testing.  While 2019 novel coronavirus  (SARS-CoV-2) nucleic acids may be present in the submitted sample  additional confirmatory testing may be necessary for epidemiological  and / or clinical management purposes  to differentiate between  SARS-CoV-2 and other  Sarbecovirus currently known to infect humans.  If clinically indicated additional testing with an alternate test  methodology [phone]) is advised. The SARS-CoV-2 RNA is generally  detectable in upper and lower respiratory sp ecimens during the acute  phase of infection. The expected result is Negative. Fact Sheet for Patients:  StrictlyIdeas.no Fact Sheet for Healthcare Providers: BankingDealers.co.za This test is not yet approved or cleared by the [place] FDA and has been authorized for detection and/or diagnosis of SARS-CoV-2 by FDA under an Emergency Use Authorization (EUA).  This EUA will remain in effect (meaning this test can be used) for the duration of the COVID-19 declaration under Section 564(b)(1) of the Act, 21 U.S.C. section 360bbb-3(b)(1), unless the authorization is terminated or revoked sooner.  Performed at [facility], [address] [place], [place] [address]   Gastrointestinal Panel by PCR , Stool     Status: Abnormal   Collection Time: 02/11/19 10:34 PM   Specimen: STOOL  Result Value Ref Range Status   Campylobacter species DETECTED (A) NOT DETECTED Final    Comment: RESULT CALLED TO, READ BACK BY AND VERIFIED WITH: MARY MILLS AT 1802 02/13/2019  TFK    Plesimonas shigelloides NOT DETECTED NOT DETECTED Final   Salmonella species NOT DETECTED NOT DETECTED Final   Yersinia enterocolitica NOT DETECTED NOT DETECTED Final   Vibrio species NOT DETECTED NOT DETECTED Final   Vibrio cholerae NOT DETECTED NOT DETECTED Final   Enteroaggregative E coli (EAEC) NOT DETECTED NOT DETECTED Final   Enteropathogenic E coli (EPEC) NOT DETECTED NOT DETECTED Final   Enterotoxigenic E coli (ETEC) NOT DETECTED NOT DETECTED Final   Shiga like toxin producing E coli (STEC) NOT DETECTED NOT DETECTED Final   Shigella/Enteroinvasive E coli (EIEC) NOT DETECTED NOT DETECTED Final   Cryptosporidium NOT DETECTED NOT DETECTED Final    Cyclospora cayetanensis NOT DETECTED NOT DETECTED Final   Entamoeba histolytica NOT DETECTED NOT DETECTED Final   Giardia lamblia NOT DETECTED NOT DETECTED Final   Adenovirus F40/41 NOT DETECTED NOT DETECTED Final   Astrovirus NOT DETECTED NOT DETECTED Final   Norovirus GI/GII NOT DETECTED NOT DETECTED Final   Rotavirus A NOT DETECTED NOT DETECTED Final   Sapovirus (I, II, IV, and V) NOT DETECTED NOT DETECTED Final    Comment: Performed at [facility], [address] [place], [place] [address]     Labs: BNP (last 3 results) Recent Labs    03/31/18 0411  BNP 734.2*   Basic Metabolic Panel: Recent Labs  Lab 02/11/19 2326 02/12/19 0518 02/14/19 0528  NA 134* 137 138  K 4.9 4.9 5.0  CL 109 109 112*  CO2 12* 17* 14*  GLUCOSE 75 80 90  BUN 91* 92* 83*  CREATININE 5.00* 5.03* 4.84*  CALCIUM 8.2* 8.2* 8.2*   Liver Function Tests: No results for input(s): AST, ALT, ALKPHOS, BILITOT, PROT, ALBUMIN in the last 168 hours. No results for input(s): LIPASE, AMYLASE in the last 168 hours. No results for input(s): AMMONIA in the last 168 hours. CBC: Recent Labs  Lab 02/12/19 0518  WBC 5.7  HGB 9.5*  HCT 32.9*  MCV 100.3*  PLT 154   Cardiac Enzymes: No results for input(s): CKTOTAL, CKMB, CKMBINDEX, TROPONINI in the last 168 hours. BNP: Invalid input(s): POCBNP CBG: Recent Labs  Lab 02/14/19 0516 02/15/19 0538 02/15/19 2122 01/30/2019 0450 02/13/2019 0730  GLUCAP 78 79 115* 80 79   D-Dimer No results for input(s): DDIMER in the last 72 hours. Hgb A1c No results for input(s): HGBA1C in the last 72 hours. Lipid Profile No results for input(s): CHOL, HDL, LDLCALC, TRIG, CHOLHDL, LDLDIRECT in the last 72 hours. Thyroid function studies No results for input(s): TSH, T4TOTAL, T3FREE, THYROIDAB in the last 72 hours.  Invalid input(s): FREET3 Anemia work up No results for input(s): VITAMINB12, FOLATE, FERRITIN, TIBC, IRON, RETICCTPCT in the last 72  hours. Urinalysis    Component Value Date/Time   COLORURINE YELLOW 04/01/2018 0252   APPEARANCEUR CLOUDY (A) 04/01/2018 0252   LABSPEC 1.013 04/01/2018 0252   PHURINE 5.0 04/01/2018 0252   GLUCOSEU NEGATIVE 04/01/2018 0252   HGBUR LARGE (A) 04/01/2018 0252   BILIRUBINUR NEGATIVE 04/01/2018 0252   KETONESUR NEGATIVE 04/01/2018 0252   PROTEINUR 100 (A) 04/01/2018 0252  NITRITE NEGATIVE 04/01/2018 0252   LEUKOCYTESUR SMALL (A) 04/01/2018 0252   Sepsis Labs Invalid input(s): PROCALCITONIN,  WBC,  LACTICIDVEN Microbiology Recent Results (from the past 240 hour(s))  C difficile quick scan w PCR reflex     Status: None   Collection Time: 02/11/19  9:40 PM   Specimen: STOOL  Result Value Ref Range Status   C Diff antigen NEGATIVE NEGATIVE Final   C Diff toxin NEGATIVE NEGATIVE Final   C Diff interpretation No C. difficile detected.  Final    Comment: Performed at [facility], [address], [place], [place] [address]  SARS Coronavirus 2 [facility] order, Performed in [facility] hospital lab) Nasopharyngeal STOOL     Status: None   Collection Time: 02/11/19  9:41 PM   Specimen: STOOL; Nasopharyngeal  Result Value Ref Range Status   SARS Coronavirus 2 NEGATIVE NEGATIVE Final    Comment: (NOTE) If result is NEGATIVE SARS-CoV-2 target nucleic acids are NOT DETECTED. The SARS-CoV-2 RNA is generally detectable in upper and lower  respiratory specimens during the acute phase of infection. The lowest  concentration of SARS-CoV-2 viral copies this assay can detect is 250  copies / mL. A negative result does not preclude SARS-CoV-2 infection  and should not be used as the sole basis for treatment or other  patient management decisions.  A negative result may occur with  improper specimen collection / handling, submission of specimen other  than nasopharyngeal swab, presence of viral mutation(s) within the  areas targeted by this assay, and inadequate number of viral copies  (<250 copies  / mL). A negative result must be combined with clinical  observations, patient history, and epidemiological information. If result is POSITIVE SARS-CoV-2 target nucleic acids are DETECTED. The SARS-CoV-2 RNA is generally detectable in upper and lower  respiratory specimens dur ing the acute phase of infection.  Positive  results are indicative of active infection with SARS-CoV-2.  Clinical  correlation with patient history and other diagnostic information is  necessary to determine patient infection status.  Positive results do  not rule out bacterial infection or co-infection with other viruses. If result is PRESUMPTIVE POSTIVE SARS-CoV-2 nucleic acids MAY BE PRESENT.   A presumptive positive result was obtained on the submitted specimen  and confirmed on repeat testing.  While 2019 novel coronavirus  (SARS-CoV-2) nucleic acids may be present in the submitted sample  additional confirmatory testing may be necessary for epidemiological  and / or clinical management purposes  to differentiate between  SARS-CoV-2 and other Sarbecovirus currently known to infect humans.  If clinically indicated additional testing with an alternate test  methodology [phone]) is advised. The SARS-CoV-2 RNA is generally  detectable in upper and lower respiratory sp ecimens during the acute  phase of infection. The expected result is Negative. Fact Sheet for Patients:  StrictlyIdeas.no Fact Sheet for Healthcare Providers: BankingDealers.co.za This test is not yet approved or cleared by the [place] FDA and has been authorized for detection and/or diagnosis of SARS-CoV-2 by FDA under an Emergency Use Authorization (EUA).  This EUA will remain in effect (meaning this test can be used) for the duration of the COVID-19 declaration under Section 564(b)(1) of the Act, 21 U.S.C. section 360bbb-3(b)(1), unless the authorization is terminated or revoked  sooner. Performed at [facility], [address], [place], [place] [address]   Gastrointestinal Panel by PCR , Stool     Status: Abnormal   Collection Time: 02/11/19 10:34 PM   Specimen: STOOL  Result Value Ref Range Status   Campylobacter species DETECTED (A) NOT DETECTED Final    Comment: RESULT CALLED TO, READ BACK BY AND VERIFIED WITH: MARY MILLS AT 1802 02/13/2019  TFK    Plesimonas shigelloides NOT DETECTED NOT DETECTED Final   Salmonella species NOT DETECTED NOT DETECTED Final   Yersinia enterocolitica NOT DETECTED NOT DETECTED Final   Vibrio species NOT DETECTED NOT DETECTED Final   Vibrio cholerae NOT DETECTED NOT DETECTED Final   Enteroaggregative E coli (EAEC) NOT DETECTED NOT DETECTED Final   Enteropathogenic E coli (EPEC) NOT DETECTED NOT DETECTED Final   Enterotoxigenic E coli (ETEC) NOT DETECTED NOT DETECTED Final   Shiga like toxin producing E coli (STEC) NOT DETECTED NOT DETECTED Final   Shigella/Enteroinvasive E coli (EIEC) NOT DETECTED NOT DETECTED Final   Cryptosporidium NOT DETECTED NOT DETECTED Final   Cyclospora cayetanensis NOT DETECTED NOT DETECTED Final   Entamoeba histolytica NOT DETECTED NOT DETECTED Final   Giardia lamblia NOT DETECTED NOT DETECTED Final   Adenovirus F40/41 NOT DETECTED NOT DETECTED Final   Astrovirus NOT DETECTED NOT DETECTED Final   Norovirus GI/GII NOT DETECTED NOT DETECTED Final   Rotavirus A NOT DETECTED NOT DETECTED Final   Sapovirus (I, II, IV, and V) NOT DETECTED NOT DETECTED Final    Comment: Performed at [facility], [address], [place], [place] [address]     Time coordinating discharge: 45mns  SIGNED:   JKathie Dike MD  [facility] 01/25/2019, 12:08 PM   If 7PM-7AM, please contact night-coverage [email or web address]

## 2019-02-18 ENCOUNTER — Non-Acute Institutional Stay (SKILLED_NURSING_FACILITY): Payer: Medicare Other | Admitting: Adult Health

## 2019-02-18 ENCOUNTER — Encounter: Payer: Self-pay | Admitting: Adult Health

## 2019-02-18 DIAGNOSIS — C799 Secondary malignant neoplasm of unspecified site: Secondary | ICD-10-CM

## 2019-02-18 DIAGNOSIS — I5022 Chronic systolic (congestive) heart failure: Secondary | ICD-10-CM | POA: Diagnosis not present

## 2019-02-18 DIAGNOSIS — C9000 Multiple myeloma not having achieved remission: Secondary | ICD-10-CM

## 2019-02-18 NOTE — Progress Notes (Signed)
Location:    [place] Room Number: 153/P Place of Service:  SNF (31)   CODE STATUS: DNR  No Known Allergies  Chief Complaint  Patient presents with  . Hospitalization Follow-up    HPI:  He is a 85 year old man who has been hospitalized from 02-11-19 through 01/27/2019. He went to the hospital for nausea vomiting and diarrhea. He was treated for acuate sigmoid diverticulitis. He was started on IV abt.. he had a left iliac biopsy which demonstrated metastatic adenocarcinoma. The primary site is most likely lung. He has multiple myeloma without current treatment. He has stage 5 ckd and not a candidate for dialysis. He focus of his care is to be comfort only. His family is in agreement with this plan of care. Today he is sleepy. There are no indications of pain; no reports of constipation or diarrhea.    Past Medical History:  Diagnosis Date  . Acute ST elevation myocardial infarction (STEMI) of inferior wall (HCC) 11/03/2017   PCI/DESx3 to RCA after wire dissection. EF 45-50%  . BPH (benign prostatic hyperplasia)   . CAD (coronary artery disease)    Multivessel/left main disease  . Hypothyroidism   . Mitral regurgitation   . Mixed hyperlipidemia   . Type 2 diabetes mellitus ([place])     Past Surgical History:  Procedure Laterality Date  . CORONARY STENT INTERVENTION N/A 11/03/2017   Procedure: CORONARY STENT INTERVENTION;  Surgeon: Lorretta Harp, MD;  Location: [facility];  Service: Cardiovascular;  Laterality: N/A;  . CORONARY/GRAFT ACUTE MI REVASCULARIZATION N/A 11/03/2017   Procedure: Coronary/Graft Acute MI Revascularization;  Surgeon: Lorretta Harp, MD;  Location: [facility] [facility];  Service: Cardiovascular;  Laterality: N/A;  . LEFT HEART CATH AND CORONARY ANGIOGRAPHY N/A 11/03/2017   Procedure: LEFT HEART CATH AND CORONARY ANGIOGRAPHY;  Surgeon: Lorretta Harp, MD;  Location: [place] [facility];  Service: Cardiovascular;  Laterality: N/A;   . STOMACH SURGERY     Treatment of an abscess    Social History   Socioeconomic History  . Marital status: Married    Spouse name: Not on file  . Number of children: 1  . Years of education: Not on file  . Highest education level: Not on file  Occupational History  . Not on file  Social Needs  . Financial resource strain: Not hard at all  . Food insecurity    Worry: Never true    Inability: Never true  . Transportation needs    Medical: No    Non-medical: No  Tobacco Use  . Smoking status: Former Smoker    Packs/day: 1.00    Years: 40.00    Pack years: 40.00    Types: Cigarettes    Quit date: 06/26/1982    Years since quitting: 36.6  . Smokeless tobacco: Never Used  Substance and Sexual Activity  . Alcohol use: No  . Drug use: No  . Sexual activity: Not on file  Lifestyle  . Physical activity    Days per week: 0 days    Minutes per session: 0 min  . Stress: Not at all  Relationships  . Social Herbalist on phone: Once a week    Gets together: Once a week    Attends religious service: More than 4 times per year    Active member of club or organization: Yes    Attends meetings of clubs or organizations: More than 4 times  per year    Relationship status: Married  . Intimate partner violence    Fear of current or ex partner: No    Emotionally abused: No    Physically abused: No    Forced sexual activity: No  Other Topics Concern  . Not on file  Social History Narrative  . Not on file   Family History  Problem Relation Age of Onset  . Heart failure Father 45  . Parkinsonism Mother 32  . Lung cancer Brother       VITAL SIGNS BP 118/80   Pulse (!) 108   Temp (!) 97.1 F (36.2 C) (Oral)   Resp 20   Ht '5\' 10"'  (1.778 m)   Wt 146 lb 12.8 oz (66.6 kg)   SpO2 95%   BMI 21.06 kg/m   Outpatient Encounter Medications as of 02/18/2019  Medication Sig  . acetaminophen (TYLENOL) 325 MG tablet Take 2 tablets (650 mg total) by mouth every 6 (six)  hours as needed for mild pain (or Fever >/= 101).  . furosemide (LASIX) 20 MG tablet TAKE 1 TABLET EVERY OTHER DAY.  Marland Kitchen loperamide (IMODIUM) 2 MG capsule Take 1 capsule (2 mg total) by mouth 2 (two) times daily as needed for diarrhea or loose stools. As needed.  . Morphine Sulfate (MORPHINE CONCENTRATE) 10 mg / 0.5 ml concentrated solution Take 0.5 mLs (10 mg total) by mouth every 2 (two) hours as needed for severe pain or shortness of breath.  . NON FORMULARY Diet: Full Liquid  . OXYGEN Oxygen @@ 2LT via [place] Special Instructions: Document O2 sat qshift. Every Shift Day, Evening, Night  . promethazine (PHENERGAN) 12.5 MG tablet Take 1 tablet (12.5 mg total) by mouth every 6 (six) hours as needed for nausea or vomiting.  . Wound Dressings (ALLEVYN ADHESIVE EX) Apply allevyn foam dressing to wound left buttock and change daily & prn. Once A Day  . Wound Dressings (ALLEVYN HEEL EX) Apply allevyn foam dressing to bilateral heels and change q3days and prn for blanchable erythema and prevention. Once A Day Every 3 Days   No facility-administered encounter medications on file as of 02/18/2019.      SIGNIFICANT DIAGNOSTIC EXAMS  LABS REVIEWED TODAY;   02-11-19: glucose 75; bun 91; creat 5.00; k+ 4.9; na++ 134; ca 8.2 02-12-19; wbc 5.7; hgb 9.5; hct 32.9; mcv 100.3; plt 154; glucose 80; bun 92; creat 5.03; k+ 4.9; na++ 137; ca 8.2; hgb a1c 5.7 02-14-19: glucose 90; bun 83; creat 4.84; k+ 5.0 na++ 138 ca 8.2     Review of Systems  Unable to perform ROS: Medical condition    Physical Exam Constitutional:      General: He is not in acute distress.    Appearance: He is underweight. He is not diaphoretic.  Neck:     Thyroid: No thyromegaly.  Cardiovascular:     Rate and Rhythm: Normal rate and regular rhythm.     Heart sounds: Normal heart sounds.  Pulmonary:     Effort: Pulmonary effort is normal. No respiratory distress.     Breath sounds: Normal breath sounds.     Comments: 02 dependent   Abdominal:     General: Bowel sounds are normal. There is no distension.     Palpations: Abdomen is soft.     Tenderness: There is no abdominal tenderness.  Musculoskeletal:     Right lower leg: No edema.     Left lower leg: No edema.     Comments: Is  able to move all extremities   Lymphadenopathy:     Cervical: No cervical adenopathy.  Skin:    General: Skin is warm and dry.  Neurological:     Comments: Is aware       ASSESSMENT/ PLAN:  TODAY;   1. Chronic systolic congestive heart failure  2. Metastatic adenocarcinoma 3. Multiple myeloma not having achieved remission  At this time the focus of his care is comfort only Will continue lasix 20 mg every other day to help manage edema Will continue 02 as indicated Will continue roxanol 10 mg every 2 hours as needed for pain management Will continue to monitor his status.       MD is aware of resident's narcotic use and is in agreement with current plan of care. We will attempt to wean resident as apropriate   Ok Edwards NP [facility]  Contact [phone] Monday through Friday 8am- 5pm  After hours call [phone]

## 2019-02-19 ENCOUNTER — Non-Acute Institutional Stay (SKILLED_NURSING_FACILITY): Payer: Medicare Other | Admitting: Internal Medicine

## 2019-02-19 ENCOUNTER — Encounter: Payer: Self-pay | Admitting: Internal Medicine

## 2019-02-19 DIAGNOSIS — N185 Chronic kidney disease, stage 5: Secondary | ICD-10-CM

## 2019-02-19 DIAGNOSIS — K5792 Diverticulitis of intestine, part unspecified, without perforation or abscess without bleeding: Secondary | ICD-10-CM

## 2019-02-19 DIAGNOSIS — C9000 Multiple myeloma not having achieved remission: Secondary | ICD-10-CM

## 2019-02-19 DIAGNOSIS — C799 Secondary malignant neoplasm of unspecified site: Secondary | ICD-10-CM | POA: Diagnosis not present

## 2019-02-19 DIAGNOSIS — R338 Other retention of urine: Secondary | ICD-10-CM

## 2019-02-19 DIAGNOSIS — I5022 Chronic systolic (congestive) heart failure: Secondary | ICD-10-CM

## 2019-02-19 NOTE — Progress Notes (Signed)
: Provider:  Hennie Duos., MD Location:  [facility] Room Number: 153-P Place of Service:  SNF [phone])  PCP: [name], [name], MD Patient Care Team: [name], [name], MD as PCP - General Domenic Polite Aloha Gell, MD as PCP - Cardiology (Cardiology)  Extended Emergency Contact Information Primary Emergency Contact: Leanna Battles [place] Phone: [phone] Relation: Daughter     Allergies: Patient has no known allergies.  Chief Complaint  Patient presents with  . New Admit To SNF    New admission to [facility] SN[facility]    HPI: Patient is a 85 y.o. male with multiple myeloma, CKD stage V, advanced, who presented to outside hospital with abdominal pain vomiting and diarrhea.  Patient was found to have acute sigmoid diverticulitis at the outside hospital and transferred to [facility] for further management.  At any [facility] patient underwent a left iliac crest biopsy which was positive for metastatic adenocarcinoma.  Patient was seen by oncology with recommendations for transition to comfort measures.  Patient was sent from hospital with routine meds for arthritis edema and nausea.  But these will probably DC for comfort care.  Past Medical History:  Diagnosis Date  . Acute ST elevation myocardial infarction (STEMI) of inferior wall (HCC) 11/03/2017   PCI/DESx3 to RCA after wire dissection. EF 45-50%  . BPH (benign prostatic hyperplasia)   . CAD (coronary artery disease)    Multivessel/left main disease  . Hypothyroidism   . Mitral regurgitation   . Mixed hyperlipidemia   . Type 2 diabetes mellitus ([place])     Past Surgical History:  Procedure Laterality Date  . CORONARY STENT INTERVENTION N/A 11/03/2017   Procedure: CORONARY STENT INTERVENTION;  Surgeon: Lorretta Harp, MD;  Location: [place] [facility];  Service: Cardiovascular;  Laterality: N/A;  . CORONARY/GRAFT ACUTE MI REVASCULARIZATION N/A 11/03/2017   Procedure: Coronary/Graft Acute MI  Revascularization;  Surgeon: Lorretta Harp, MD;  Location: [place] [facility];  Service: Cardiovascular;  Laterality: N/A;  . LEFT HEART CATH AND CORONARY ANGIOGRAPHY N/A 11/03/2017   Procedure: LEFT HEART CATH AND CORONARY ANGIOGRAPHY;  Surgeon: Lorretta Harp, MD;  Location: [place] [facility];  Service: Cardiovascular;  Laterality: N/A;  . STOMACH SURGERY     Treatment of an abscess    Allergies as of 02/19/2019   No Known Allergies     Medication List    Notice   This visit is during an admission. Changes to the med list made in this visit will be reflected in the After Visit Summary of the admission.    Current Outpatient Medications on File Prior to Visit  Medication Sig Dispense Refill  . acetaminophen (TYLENOL) 325 MG tablet Take 2 tablets (650 mg total) by mouth every 6 (six) hours as needed for mild pain (or Fever >/= 101).    . furosemide (LASIX) 20 MG tablet TAKE 1 TABLET EVERY OTHER DAY. 15 tablet 0  . loperamide (IMODIUM) 2 MG capsule Take 1 capsule (2 mg total) by mouth 2 (two) times daily as needed for diarrhea or loose stools. As needed. 30 capsule 0  . Morphine Sulfate (MORPHINE CONCENTRATE) 10 mg / 0.5 ml concentrated solution Take 0.5 mLs (10 mg total) by mouth every 2 (two) hours as needed for severe pain or shortness of breath. 30 mL 0  . NON FORMULARY Diet: Full Liquid    . OXYGEN Oxygen @@ 2LT via [place] Special Instructions: Document O2 sat  qshift. Every Shift Day, Evening, Night    . promethazine (PHENERGAN) 12.5 MG tablet Take 1 tablet (12.5 mg total) by mouth every 6 (six) hours as needed for nausea or vomiting. 30 tablet 0  . Wound Dressings (ALLEVYN ADHESIVE EX) Apply allevyn foam dressing to wound left buttock and change daily & prn. Once A Day    . Wound Dressings (ALLEVYN HEEL EX) Apply allevyn foam dressing to bilateral heels and change q3days and prn for blanchable erythema and prevention. Once A Day Every 3 Days     No current  facility-administered medications on file prior to visit.      No orders of the defined types were placed in this encounter.    There is no immunization history on file for this patient.  Social History   Tobacco Use  . Smoking status: Former Smoker    Packs/day: 1.00    Years: 40.00    Pack years: 40.00    Types: Cigarettes    Quit date: 06/26/1982    Years since quitting: 36.6  . Smokeless tobacco: Never Used  Substance Use Topics  . Alcohol use: No    Family history is   Family History  Problem Relation Age of Onset  . Heart failure Father 67  . Parkinsonism Mother 36  . Lung cancer Brother       Review of Systems  DATA OBTAINED: from patient, nurse; patient said he was very comfortable GENERAL:  no fevers, fatigue, appetite changes SKIN: No itching, or rash EYES: No eye pain, redness, discharge EARS: No earache, tinnitus, change in hearing NOSE: No congestion, drainage or bleeding  MOUTH/THROAT: No mouth or tooth pain, No sore throat RESPIRATORY: No cough, wheezing, SOB CARDIAC: No chest pain, palpitations, lower extremity edema  GI: No abdominal pain, No N/V/D or constipation, No heartburn or reflux  GU: No dysuria, frequency or urgency, or incontinence  MUSCULOSKELETAL: No unrelieved bone/joint pain NEUROLOGIC: No headache, dizziness or focal weakness PSYCHIATRIC: No c/o anxiety or sadness   Vitals:   02/19/19 1230  BP: 115/70  Pulse: (!) 103  Resp: 20  Temp: 98.7 F (37.1 C)  SpO2: 100%    SpO2 Readings from Last 1 Encounters:  02/19/19 100%   Body mass index is 21.48 kg/m.     Physical Exam  GENERAL APPEARANCE: Alert, moderately conversant,  No acute distress.  SKIN: No diaphoresis rash HEAD: Normocephalic, atraumatic  EYES: Conjunctiva/lids clear. Pupils round, reactive. EOMs intact.  EARS: External exam WNL, canals clear. Hearing grossly normal.  NOSE: No deformity or discharge.  MOUTH/THROAT: Lips w/o lesions  RESPIRATORY:  Breathing is even, unlabored. Lung sounds are clear   CARDIOVASCULAR: Heart RRR no murmurs, rubs or gallops. No peripheral edema.   GASTROINTESTINAL: Abdomen is soft, non-tender, not distended w/ normal bowel sounds. GENITOURINARY: Bladder non tender, not distended  MUSCULOSKELETAL: No abnormal joints or musculature NEUROLOGIC:  Cranial nerves 2-12 grossly intact. Moves all extremities  PSYCHIATRIC: Mood and affect appropriate to situation, patient appears very peaceful, no behavioral issues  Patient Active Problem List   Diagnosis Date Noted  . Metastatic adenocarcinoma ([place]) 02/12/2019  . Diverticulitis 02/11/2019  . Multiple myeloma ([place]) 01/16/2019  . Goals of care, counseling/discussion 01/16/2019  . Multiple myeloma not having achieved remission ([place]) 01/16/2019  . CKD (chronic kidney disease), stage V ([place]) 10/16/2018  . MGUS (monoclonal gammopathy of unknown significance) 09/30/2018  . Normocytic anemia 09/30/2018  . Stage I pressure ulcer of buttock 04/03/2018  . Acute CHF (congestive  heart failure) ([place]) 03/31/2018  . Hypothyroidism (acquired) 03/31/2018  . GERD (gastroesophageal reflux disease) 03/31/2018  . Congestive heart failure (CHF) ([place]) 03/31/2018  . Essential hypertension 11/23/2017  . Acute renal failure superimposed on stage 3 chronic kidney disease ([place]) 11/07/2017  . Coronary artery disease involving native coronary artery of native heart with unstable angina pectoris ([place]) 11/05/2017  . Left main coronary artery disease 11/05/2017  . Hyperlipidemia with target LDL less than 70 11/05/2017  . Type 2 diabetes mellitus with complication, without long-term current use of insulin ([place]) 11/05/2017  . Moderate mitral regurgitation by prior echocardiogram 11/05/2017  . Pressure injury of skin 11/04/2017  . Acute ST elevation myocardial infarction (STEMI) of inferior wall ([place]) 11/03/2017  . Precordial pain 05/07/2012      Labs reviewed: Basic Metabolic Panel:     Component Value Date/Time   NA 138 02/14/2019 0528   K 5.0 02/14/2019 0528   CL 112 (H) 02/14/2019 0528   CO2 14 (L) 02/14/2019 0528   GLUCOSE 90 02/14/2019 0528   BUN 83 (H) 02/14/2019 0528   CREATININE 4.84 (H) 02/14/2019 0528   CALCIUM 8.2 (L) 02/14/2019 0528   PROT 6.7 01/16/2019 1058   ALBUMIN 2.7 (L) 01/16/2019 1058   AST 14 (L) 01/16/2019 1058   ALT 12 01/16/2019 1058   ALKPHOS 66 01/16/2019 1058   BILITOT 0.6 01/16/2019 1058   GFRNONAA 10 (L) 02/14/2019 0528   GFRAA 11 (L) 02/14/2019 0528    Recent Labs    02/11/19 2326 02/12/19 0518 02/14/19 0528  NA 134* 137 138  K 4.9 4.9 5.0  CL 109 109 112*  CO2 12* 17* 14*  GLUCOSE 75 80 90  BUN 91* 92* 83*  CREATININE 5.00* 5.03* 4.84*  CALCIUM 8.2* 8.2* 8.2*   Liver Function Tests: Recent Labs    09/30/18 1509 01/02/19 1106 01/16/19 1058  AST 21 16 14*  ALT '24 14 12  ' ALKPHOS 68 61 66  BILITOT 0.4 0.6 0.6  PROT 7.3 6.0* 6.7  ALBUMIN 3.8 2.7* 2.7*   No results for input(s): LIPASE, AMYLASE in the last 8760 hours. No results for input(s): AMMONIA in the last 8760 hours. CBC: Recent Labs    01/02/19 1106 01/09/19 1347 01/16/19 1058 02/03/19 1102 02/12/19 0518  WBC 4.5 6.4 7.1 6.8 5.7  NEUTROABS 3.4 5.2 5.9  --   --   HGB 8.6* 10.0* 10.5* 10.6* 9.5*  HCT 30.3* 33.8* 35.5* 37.2* 32.9*  MCV 104.1* 103.0* 101.1* 101.6* 100.3*  PLT 295 256 230 238 154   Lipid No results for input(s): CHOL, HDL, LDLCALC, TRIG in the last 8760 hours.  Cardiac Enzymes: No results for input(s): CKTOTAL, CKMB, CKMBINDEX, TROPONINI in the last 8760 hours. BNP: Recent Labs    03/31/18 0411  BNP 650.5*   No results found for: [facility] Lab Results  Component Value Date   HGBA1C 5.7 (H) 02/12/2019   No results found for: TSH Lab Results  Component Value Date   VITAMINB12 1,106 (H) 01/16/2019   Lab Results  Component Value Date   FOLATE 35.0 09/30/2018   Lab Results  Component Value Date   IRON 9 (L) 12/26/2018    TIBC 170 (L) 12/26/2018   FERRITIN 195 12/26/2018    Imaging and Procedures obtained prior to SNF admission: No results found.   Not all labs, radiology exams or other studies done during hospitalization come through on my EPIC note; however they are reviewed by me.    Assessment and  Plan  Acute sigmoid diverticulitis- CT scan done at outside hospital; stool for C. difficile found to be negative; GI pathogen panel positive for Campylobacter and patient was treated with antibiotics; overall diarrhea has improved and not having any more abdominal pain  CKD stage V-reported creatinine has been trending up for the past 10 months and patient is not a candidate for dialysis; he was returned on his home Lasix to treat lower extremity edema; family understands that his renal function will be likely to decline SNF- patient's Lasix was DC'd secondary to comfort care needs  Multiple myeloma patient is followed by oncology; not currently on any treatment  Metastatic adenocarcinoma- likely primary is lung patient recently underwent biopsy of the left iliac bone which was noted to be positive on a recent PET scan results confirmed metastatic adenocarcinoma.  He was seen by oncology who felt that he was not a candidate for any further aggressive treatment.  Goals of care were discussed and the patient's daughter and he agreed to focus on comfort care SNF-patient is admitted for comfort care; all medications have been stopped except for patient's Roxanol  Urinary retention-patient had intermittent retention while in the hospital; Foley catheter was placed for comfort care SNF- Foley catheter will remain for comfort care  Chronic diastolic congestive heart failure- patient has chronic lower extremity edema with ejection fraction of 40% on last echo; patient is chronically on Lasix 20 mg daily SNF- patient's Lasix was stopped for comfort care   Time spent greater than 45 minutes;> 50% of time with  patient was spent reviewing records, labs, tests and studies, counseling and developing plan of care  Hennie Duos, MD

## 2019-02-20 ENCOUNTER — Non-Acute Institutional Stay (SKILLED_NURSING_FACILITY): Payer: Medicare Other | Admitting: Adult Health

## 2019-02-20 ENCOUNTER — Encounter: Payer: Self-pay | Admitting: Adult Health

## 2019-02-20 DIAGNOSIS — N185 Chronic kidney disease, stage 5: Secondary | ICD-10-CM | POA: Diagnosis not present

## 2019-02-20 DIAGNOSIS — R627 Adult failure to thrive: Secondary | ICD-10-CM | POA: Diagnosis not present

## 2019-02-20 DIAGNOSIS — C9000 Multiple myeloma not having achieved remission: Secondary | ICD-10-CM

## 2019-02-20 NOTE — Progress Notes (Signed)
Location:  [facility] Room Number: 153-P Place of Service:  SNF (31)   CODE STATUS: DNR  No Known Allergies  Chief Complaint  Patient presents with  . Acute Visit    Patient is seen for end of life care.    HPI:  He has had a change in status. He is less responsive; has shallow respirations. No signs of distress present. The focus of his care is for comfort only.    Past Medical History:  Diagnosis Date  . Acute ST elevation myocardial infarction (STEMI) of inferior wall (HCC) 11/03/2017   PCI/DESx3 to RCA after wire dissection. EF 45-50%  . BPH (benign prostatic hyperplasia)   . CAD (coronary artery disease)    Multivessel/left main disease  . Hypothyroidism   . Mitral regurgitation   . Mixed hyperlipidemia   . Type 2 diabetes mellitus ([place])     Past Surgical History:  Procedure Laterality Date  . CORONARY STENT INTERVENTION N/A 11/03/2017   Procedure: CORONARY STENT INTERVENTION;  Surgeon: Lorretta Harp, MD;  Location: [facility];  Service: Cardiovascular;  Laterality: N/A;  . CORONARY/GRAFT ACUTE MI REVASCULARIZATION N/A 11/03/2017   Procedure: Coronary/Graft Acute MI Revascularization;  Surgeon: Lorretta Harp, MD;  Location: [facility];  Service: Cardiovascular;  Laterality: N/A;  . LEFT HEART CATH AND CORONARY ANGIOGRAPHY N/A 11/03/2017   Procedure: LEFT HEART CATH AND CORONARY ANGIOGRAPHY;  Surgeon: Lorretta Harp, MD;  Location: [facility];  Service: Cardiovascular;  Laterality: N/A;  . STOMACH SURGERY     Treatment of an abscess    Social History   Socioeconomic History  . Marital status: Married    Spouse name: Not on file  . Number of children: 1  . Years of education: Not on file  . Highest education level: Not on file  Occupational History  . Not on file  Social Needs  . Financial resource strain: Not hard at all  . Food insecurity    Worry: Never true    Inability: Never true  .  Transportation needs    Medical: No    Non-medical: No  Tobacco Use  . Smoking status: Former Smoker    Packs/day: 1.00    Years: 40.00    Pack years: 40.00    Types: Cigarettes    Quit date: 06/26/1982    Years since quitting: 36.6  . Smokeless tobacco: Never Used  Substance and Sexual Activity  . Alcohol use: No  . Drug use: No  . Sexual activity: Not on file  Lifestyle  . Physical activity    Days per week: 0 days    Minutes per session: 0 min  . Stress: Not at all  Relationships  . Social Herbalist on phone: Once a week    Gets together: Once a week    Attends religious service: More than 4 times per year    Active member of club or organization: Yes    Attends meetings of clubs or organizations: More than 4 times per year    Relationship status: Married  . Intimate partner violence    Fear of current or ex partner: No    Emotionally abused: No    Physically abused: No    Forced sexual activity: No  Other Topics Concern  . Not on file  Social History Narrative   Married.  One child.  Former cigarette smoker.    Family History  Problem  Relation Age of Onset  . Heart failure Father 14  . Parkinsonism Mother 69  . Lung cancer Brother       VITAL SIGNS BP 110/67   Pulse (!) 106   Temp 99 F (37.2 C) (Oral)   Resp 20   Ht _0  (1.778 m)   Wt 145 lb 4.8 oz (65.9 kg)   SpO2 100% Comment: On 2L  BMI 20.85 kg/m   Outpatient Encounter Medications as of 02/20/2019  Medication Sig  . acetaminophen (TYLENOL) 325 MG tablet Take 2 tablets (650 mg total) by mouth every 6 (six) hours as needed for mild pain (or Fever >/= 101).  . furosemide (LASIX) 20 MG tablet TAKE 1 TABLET EVERY OTHER DAY.  Marland Kitchen loperamide (IMODIUM) 2 MG capsule Take 1 capsule (2 mg total) by mouth 2 (two) times daily as needed for diarrhea or loose stools. As needed.  . Morphine Sulfate (MORPHINE CONCENTRATE) 10 mg / 0.5 ml concentrated solution Take 0.5 mLs (10 mg total) by mouth every 2  (two) hours as needed for severe pain or shortness of breath.  . NON FORMULARY Diet: Full Liquid  . OXYGEN Oxygen @@ 2LT via [place] Special Instructions: Document O2 sat qshift. Every Shift Day, Evening, Night  . promethazine (PHENERGAN) 12.5 MG tablet Take 1 tablet (12.5 mg total) by mouth every 6 (six) hours as needed for nausea or vomiting.  . Wound Dressings (ALLEVYN ADHESIVE EX) Apply allevyn foam dressing to wound left buttock and change daily & prn. Once A Day  . Wound Dressings (ALLEVYN HEEL EX) Apply allevyn foam dressing to bilateral heels and change q3days and prn for blanchable erythema and prevention. Once A Day Every 3 Days   No facility-administered encounter medications on file as of 02/20/2019.      SIGNIFICANT DIAGNOSTIC EXAMS   LABS REVIEWED PREVIOUS;   02-11-19: glucose 75; bun 91; creat 5.00; k+ 4.9; na++ 134; ca 8.2 02-12-19; wbc 5.7; hgb 9.5; hct 32.9; mcv 100.3; plt 154; glucose 80; bun 92; creat 5.03; k+ 4.9; na++ 137; ca 8.2; hgb a1c 5.7 02-14-19: glucose 90; bun 83; creat 4.84; k+ 5.0 na++ 138 ca 8.2   NO NEW LABS.   Review of Systems  Unable to perform ROS: Medical condition    Physical Exam Constitutional:      General: He is not in acute distress.    Appearance: He is underweight. He is not diaphoretic.  Neck:     Musculoskeletal: Neck supple.     Thyroid: No thyromegaly.  Cardiovascular:     Rate and Rhythm: Normal rate and regular rhythm.     Pulses: Normal pulses.     Heart sounds: Normal heart sounds.  Pulmonary:     Effort: No respiratory distress.     Breath sounds: Normal breath sounds.     Comments: Respirations slow and shallow 02 dependent  Abdominal:     General: Bowel sounds are normal. There is no distension.     Palpations: Abdomen is soft.     Tenderness: There is no abdominal tenderness.  Musculoskeletal:     Right lower leg: No edema.     Left lower leg: No edema.     Comments: Has little movement of his extremities    Lymphadenopathy:     Cervical: No cervical adenopathy.  Skin:    General: Skin is warm and dry.      ASSESSMENT/ PLAN:  TODAY   1. Multiple myeloma not having achieve remission 2. Failure  to thrive in adult 3. Stage V chronic kidney disease   Will continue to monitor his status His family has been informed of his status.  Will continue to focus upon his comfort.   MD is aware of resident's narcotic use and is in agreement with current plan of care. We will attempt to wean resident as appropriate.  Ok Edwards NP [facility]  Contact [phone] Monday through Friday 8am- 5pm  After hours call [phone]

## 2019-02-23 DIAGNOSIS — R627 Adult failure to thrive: Secondary | ICD-10-CM | POA: Insufficient documentation

## 2019-02-25 DEATH — deceased

## 2019-03-01 ENCOUNTER — Encounter: Payer: Self-pay | Admitting: Internal Medicine

## 2019-03-01 DIAGNOSIS — R338 Other retention of urine: Secondary | ICD-10-CM | POA: Insufficient documentation

## 2019-03-01 DIAGNOSIS — K5792 Diverticulitis of intestine, part unspecified, without perforation or abscess without bleeding: Secondary | ICD-10-CM | POA: Insufficient documentation

## 2019-03-06 ENCOUNTER — Ambulatory Visit: Payer: Medicare Other | Admitting: Cardiology

## 2020-03-04 IMAGING — DX METASTATIC BONE SURVEY
8 of 10 series · 8 of 10 positions shown · non-contrast
Comparison: None.

CLINICAL DATA: Monoclonal gammopathy undetermined significance.

EXAM:
METASTATIC BONE SURVEY

[skull lat]
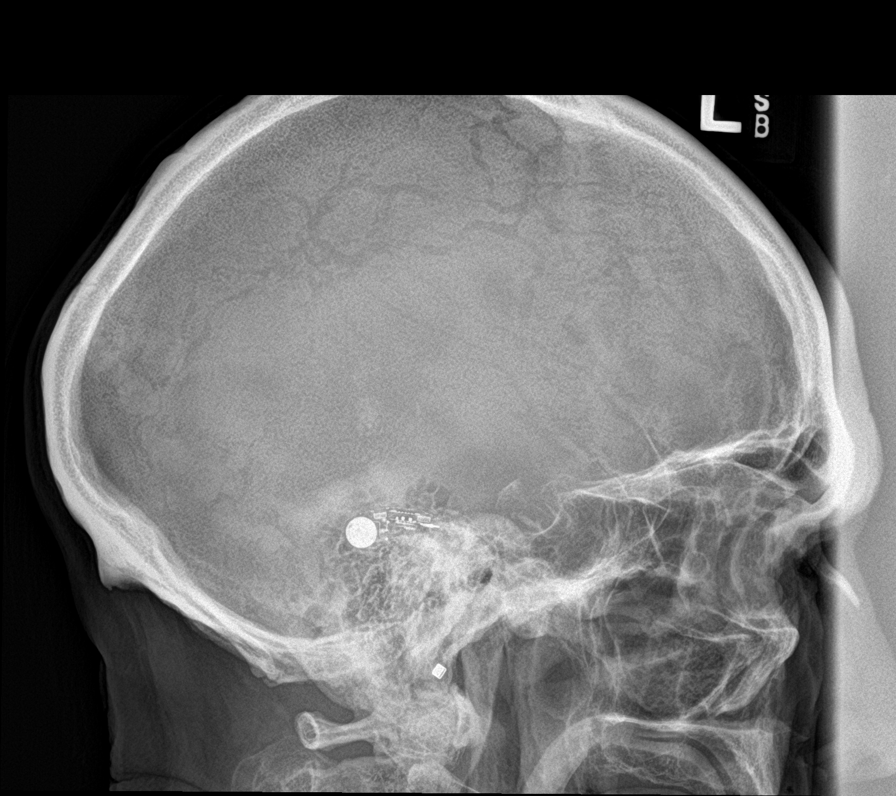

[shoulder ap (1 of 2)]
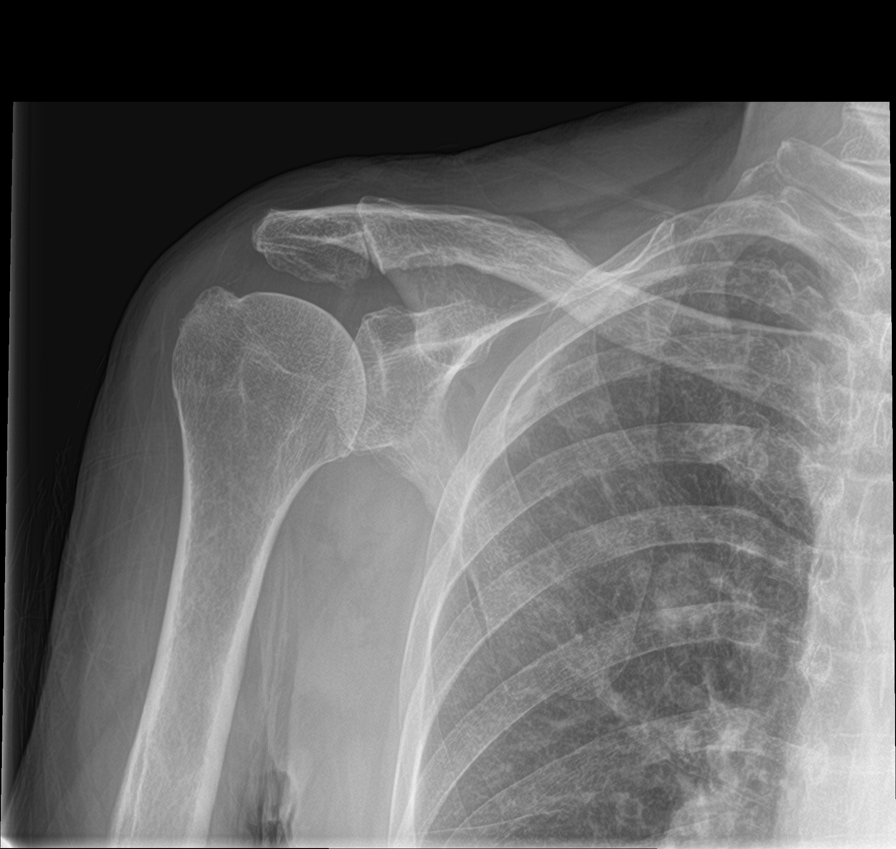

[shoulder ap (2 of 2)]
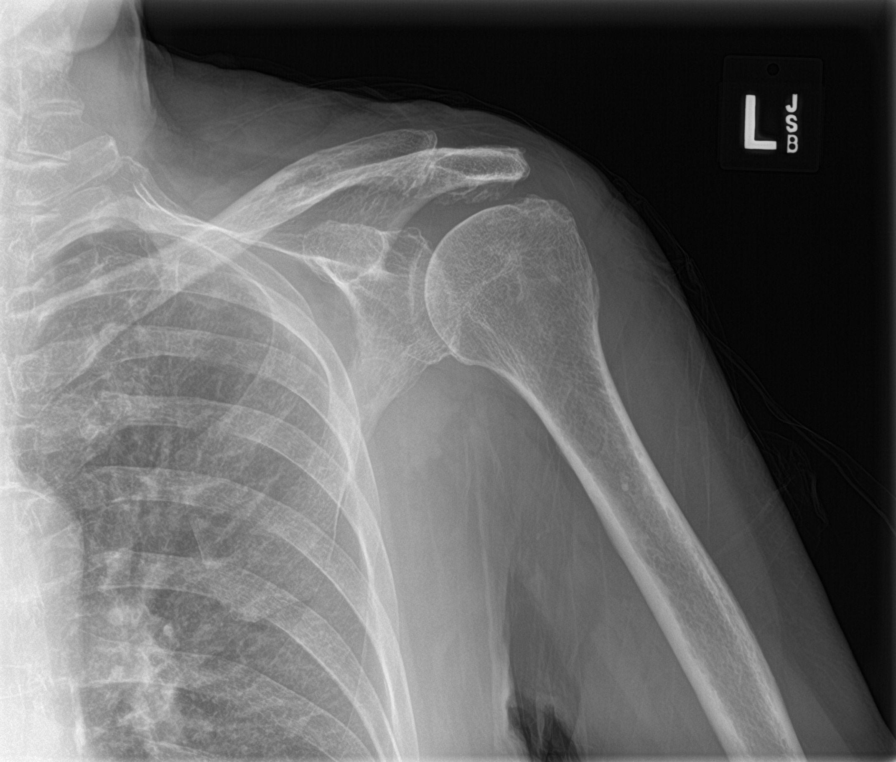

[humerus ap (1 of 2)]
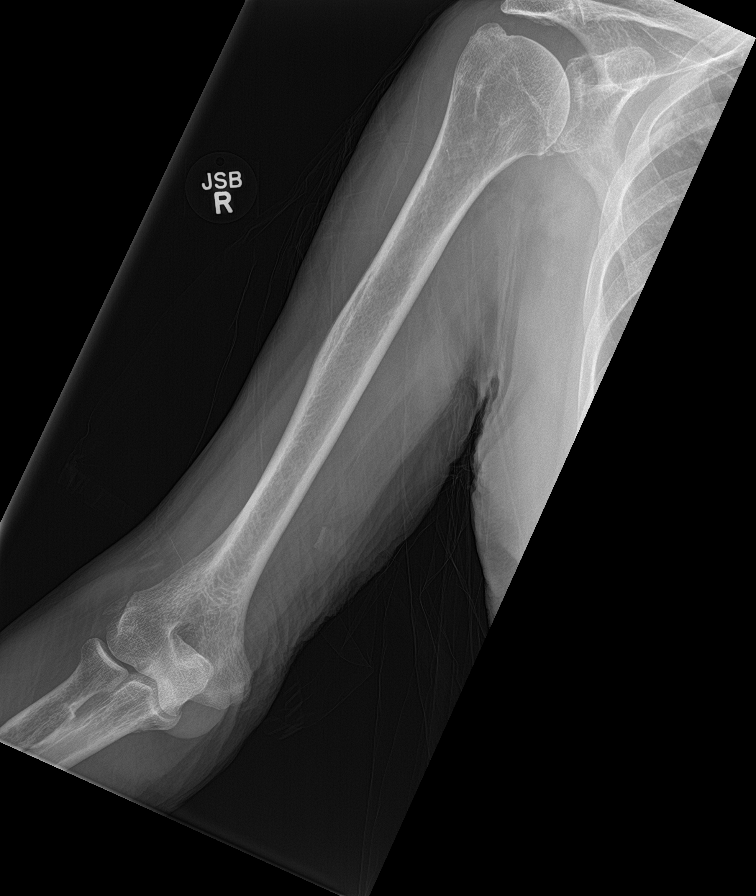

[humerus ap (2 of 2)]
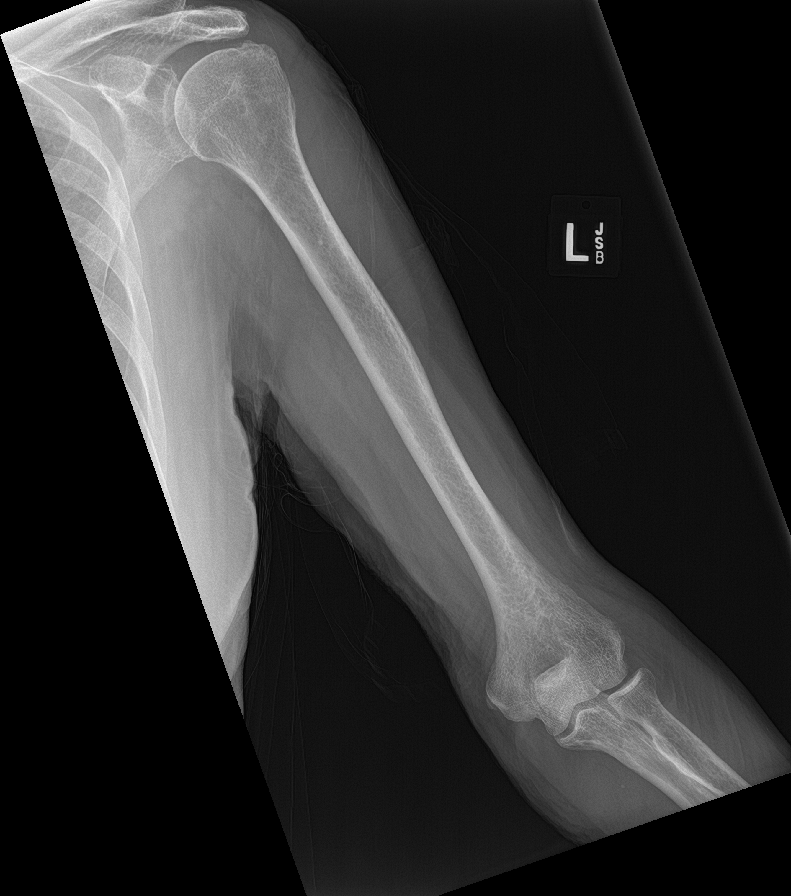

[forearm ap (1 of 2)]
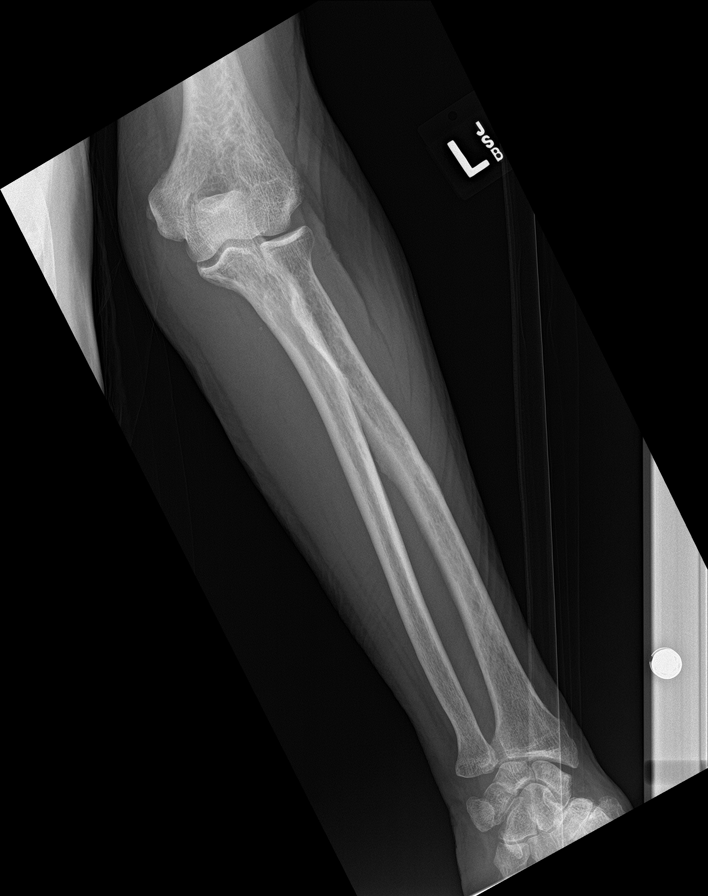

[forearm ap (2 of 2)]
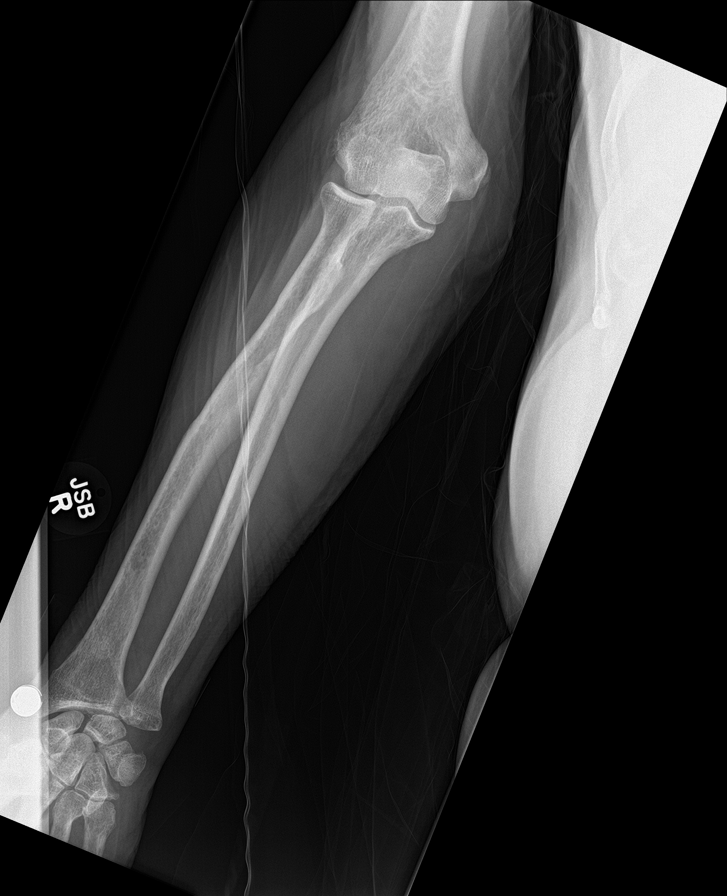

[c-spine ap]
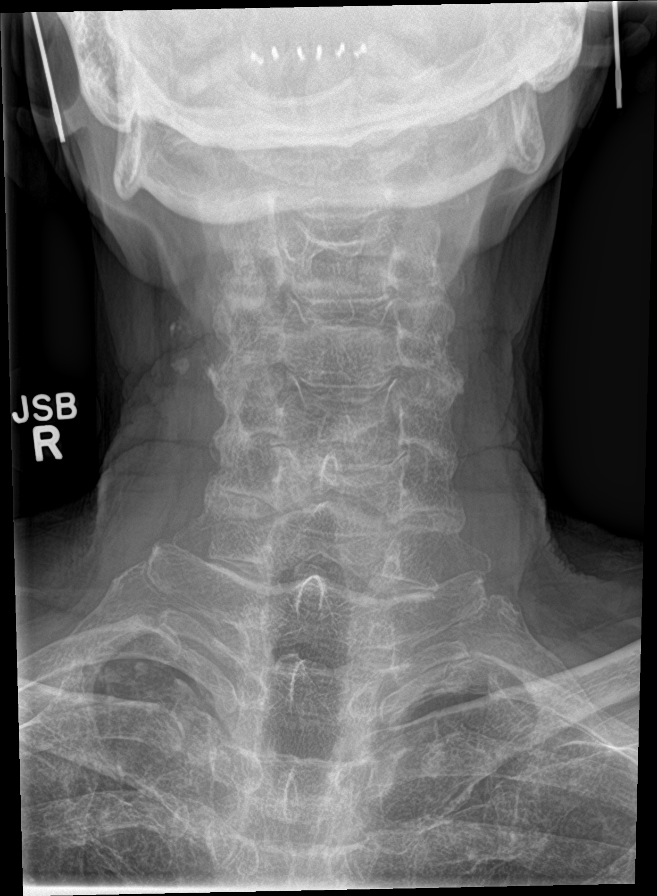

[8 of 10 positions shown; findings below may reference images not displayed]

FINDINGS: No lytic or sclerotic lesion is seen in the axial or appendicular
skeleton. No fracture or dislocation. Loss of disc space height and
facet arthropathy are seen at L4-5 and L5-S1. Minimal cervical
spondylosis is seen at C6-7. Scattered atherosclerotic vascular
disease is noted.
IMPRESSION: Negative for lytic or sclerotic bone lesion.

Atherosclerosis.
